# Patient Record
Sex: Female | Born: 1980 | Race: Black or African American | Hispanic: No | Marital: Married | Smoking: Never smoker
Health system: Southern US, Community
[De-identification: ages and names within clinical notes are randomized; demographics above are authoritative.]

## PROBLEM LIST (undated history)

## (undated) ENCOUNTER — Inpatient Hospital Stay (HOSPITAL_COMMUNITY): Payer: Self-pay

## (undated) ENCOUNTER — Emergency Department (HOSPITAL_BASED_OUTPATIENT_CLINIC_OR_DEPARTMENT_OTHER): Payer: 59

## (undated) DIAGNOSIS — F419 Anxiety disorder, unspecified: Secondary | ICD-10-CM

## (undated) DIAGNOSIS — R5383 Other fatigue: Secondary | ICD-10-CM

## (undated) DIAGNOSIS — IMO0002 Reserved for concepts with insufficient information to code with codable children: Secondary | ICD-10-CM

## (undated) DIAGNOSIS — Z8619 Personal history of other infectious and parasitic diseases: Secondary | ICD-10-CM

## (undated) DIAGNOSIS — O24419 Gestational diabetes mellitus in pregnancy, unspecified control: Secondary | ICD-10-CM

## (undated) DIAGNOSIS — G510 Bell's palsy: Secondary | ICD-10-CM

## (undated) DIAGNOSIS — R87619 Unspecified abnormal cytological findings in specimens from cervix uteri: Secondary | ICD-10-CM

## (undated) DIAGNOSIS — G709 Myoneural disorder, unspecified: Secondary | ICD-10-CM

## (undated) DIAGNOSIS — K589 Irritable bowel syndrome without diarrhea: Secondary | ICD-10-CM

## (undated) HISTORY — DX: Irritable bowel syndrome, unspecified: K58.9

## (undated) HISTORY — DX: Myoneural disorder, unspecified: G70.9

## (undated) HISTORY — DX: Anxiety disorder, unspecified: F41.9

## (undated) HISTORY — DX: Bell's palsy: G51.0

## (undated) HISTORY — PX: THERAPEUTIC ABORTION: SHX798

## (undated) HISTORY — PX: CHOLECYSTECTOMY: SHX55

## (undated) HISTORY — DX: Other fatigue: R53.83

## (undated) HISTORY — PX: WISDOM TOOTH EXTRACTION: SHX21

## (undated) HISTORY — DX: Gestational diabetes mellitus in pregnancy, unspecified control: O24.419

## (undated) HISTORY — PX: OTHER SURGICAL HISTORY: SHX169

## (undated) HISTORY — DX: Personal history of other infectious and parasitic diseases: Z86.19

---

## 2009-08-03 ENCOUNTER — Emergency Department (HOSPITAL_COMMUNITY): Admission: EM | Admit: 2009-08-03 | Discharge: 2009-08-03 | Payer: Self-pay | Admitting: Emergency Medicine

## 2009-10-11 ENCOUNTER — Inpatient Hospital Stay (HOSPITAL_COMMUNITY): Admission: AD | Admit: 2009-10-11 | Discharge: 2009-10-11 | Payer: Self-pay | Admitting: Family Medicine

## 2009-12-07 ENCOUNTER — Ambulatory Visit (HOSPITAL_COMMUNITY): Admission: RE | Admit: 2009-12-07 | Discharge: 2009-12-07 | Payer: Self-pay | Admitting: Obstetrics

## 2010-02-01 ENCOUNTER — Inpatient Hospital Stay (HOSPITAL_COMMUNITY): Admission: AD | Admit: 2010-02-01 | Discharge: 2010-02-01 | Payer: Self-pay | Admitting: Obstetrics

## 2010-04-28 ENCOUNTER — Inpatient Hospital Stay (HOSPITAL_COMMUNITY): Admission: AD | Admit: 2010-04-28 | Discharge: 2010-04-28 | Payer: Self-pay | Admitting: Obstetrics

## 2010-05-06 ENCOUNTER — Inpatient Hospital Stay (HOSPITAL_COMMUNITY): Admission: AD | Admit: 2010-05-06 | Discharge: 2010-05-06 | Payer: Self-pay | Admitting: Obstetrics & Gynecology

## 2010-11-28 ENCOUNTER — Inpatient Hospital Stay (HOSPITAL_COMMUNITY): Admission: RE | Admit: 2010-11-28 | Discharge: 2010-05-10 | Payer: Self-pay | Admitting: Obstetrics

## 2011-03-10 LAB — CBC
HCT: 33.8 % — ABNORMAL LOW (ref 36.0–46.0)
MCHC: 33.1 g/dL (ref 30.0–36.0)
MCV: 86.6 fL (ref 78.0–100.0)
Platelets: 167 10*3/uL (ref 150–400)
Platelets: 192 10*3/uL (ref 150–400)
RBC: 4.41 MIL/uL (ref 3.87–5.11)
WBC: 12 10*3/uL — ABNORMAL HIGH (ref 4.0–10.5)
WBC: 13.1 10*3/uL — ABNORMAL HIGH (ref 4.0–10.5)

## 2011-03-10 LAB — ABO/RH: ABO/RH(D): O POS

## 2011-03-10 LAB — HEPATITIS B SURFACE ANTIGEN: Hepatitis B Surface Ag: NEGATIVE

## 2011-03-10 LAB — TYPE AND SCREEN: ABO/RH(D): O POS

## 2011-03-10 LAB — RPR: RPR Ser Ql: NONREACTIVE

## 2011-03-12 LAB — URINALYSIS, ROUTINE W REFLEX MICROSCOPIC
Bilirubin Urine: NEGATIVE
Glucose, UA: NEGATIVE mg/dL
Ketones, ur: NEGATIVE mg/dL
Nitrite: NEGATIVE
Protein, ur: NEGATIVE mg/dL
pH: 6.5 (ref 5.0–8.0)

## 2011-03-27 LAB — URINALYSIS, ROUTINE W REFLEX MICROSCOPIC
Bilirubin Urine: NEGATIVE
Glucose, UA: NEGATIVE mg/dL
Hgb urine dipstick: NEGATIVE
Specific Gravity, Urine: 1.01 (ref 1.005–1.030)

## 2011-03-27 LAB — COMPREHENSIVE METABOLIC PANEL
ALT: 25 U/L (ref 0–35)
Alkaline Phosphatase: 66 U/L (ref 39–117)
BUN: 5 mg/dL — ABNORMAL LOW (ref 6–23)
CO2: 27 mEq/L (ref 19–32)
Chloride: 103 mEq/L (ref 96–112)
GFR calc non Af Amer: >60 mL/min (ref >60)
Glucose, Bld: 80 mg/dL (ref 70–99)
Potassium: 3.8 mEq/L (ref 3.5–5.1)
Sodium: 134 mEq/L — ABNORMAL LOW (ref 135–145)
Total Bilirubin: 0.4 mg/dL (ref 0.3–1.2)

## 2011-03-30 LAB — POCT I-STAT, CHEM 8
BUN: 9 mg/dL (ref 6–23)
Calcium, Ion: 1.08 mmol/L — ABNORMAL LOW (ref 1.12–1.32)
Creatinine, Ser: 0.8 mg/dL (ref 0.4–1.2)
Glucose, Bld: 91 mg/dL (ref 70–99)
Sodium: 137 mEq/L (ref 135–145)
TCO2: 24 mmol/L (ref 0–100)

## 2011-03-30 LAB — GLUCOSE, CAPILLARY: Glucose-Capillary: 89 mg/dL (ref 70–99)

## 2011-06-22 ENCOUNTER — Emergency Department (HOSPITAL_COMMUNITY)
Admission: EM | Admit: 2011-06-22 | Discharge: 2011-06-23 | Disposition: A | Discharge status: Home / Self Care | Payer: No Typology Code available for payment source | Attending: Emergency Medicine | Admitting: Emergency Medicine

## 2011-06-22 DIAGNOSIS — K5900 Constipation, unspecified: Secondary | ICD-10-CM | POA: Insufficient documentation

## 2011-06-22 DIAGNOSIS — M546 Pain in thoracic spine: Secondary | ICD-10-CM | POA: Insufficient documentation

## 2011-06-22 DIAGNOSIS — R1013 Epigastric pain: Secondary | ICD-10-CM | POA: Insufficient documentation

## 2011-06-22 DIAGNOSIS — R569 Unspecified convulsions: Secondary | ICD-10-CM | POA: Insufficient documentation

## 2011-06-22 LAB — URINALYSIS, ROUTINE W REFLEX MICROSCOPIC
Bilirubin Urine: NEGATIVE
Glucose, UA: NEGATIVE mg/dL
Nitrite: NEGATIVE
Specific Gravity, Urine: 1.025 (ref 1.005–1.030)
pH: 8 (ref 5.0–8.0)

## 2011-06-22 LAB — POCT PREGNANCY, URINE: Preg Test, Ur: NEGATIVE

## 2011-06-22 LAB — CBC
HCT: 35.8 % — ABNORMAL LOW (ref 36.0–46.0)
Hemoglobin: 11.7 g/dL — ABNORMAL LOW (ref 12.0–15.0)
MCHC: 32.7 g/dL (ref 30.0–36.0)
WBC: 9.7 10*3/uL (ref 4.0–10.5)

## 2011-06-22 LAB — DIFFERENTIAL
Basophils Absolute: 0 10*3/uL (ref 0.0–0.1)
Lymphocytes Relative: 24 % (ref 12–46)
Lymphs Abs: 2.3 10*3/uL (ref 0.7–4.0)
Monocytes Absolute: 0.7 10*3/uL (ref 0.1–1.0)
Neutro Abs: 6.5 10*3/uL (ref 1.7–7.7)

## 2011-06-23 LAB — LIPASE, BLOOD: Lipase: 18 U/L (ref 11–59)

## 2011-06-23 LAB — COMPREHENSIVE METABOLIC PANEL
Alkaline Phosphatase: 98 U/L (ref 39–117)
BUN: 14 mg/dL (ref 6–23)
Chloride: 102 mEq/L (ref 96–112)
GFR calc Af Amer: >60 mL/min (ref >60)
Glucose, Bld: 112 mg/dL — ABNORMAL HIGH (ref 70–99)
Potassium: 4 mEq/L (ref 3.5–5.1)
Total Bilirubin: 0.3 mg/dL (ref 0.3–1.2)

## 2011-07-18 ENCOUNTER — Other Ambulatory Visit: Payer: Self-pay | Admitting: Emergency Medicine

## 2011-07-18 DIAGNOSIS — R7989 Other specified abnormal findings of blood chemistry: Secondary | ICD-10-CM

## 2011-07-21 ENCOUNTER — Ambulatory Visit
Admission: RE | Admit: 2011-07-21 | Discharge: 2011-07-21 | Disposition: A | Discharge status: Home / Self Care | Payer: No Typology Code available for payment source | Source: Ambulatory Visit | Attending: Emergency Medicine | Admitting: Emergency Medicine

## 2011-07-21 DIAGNOSIS — R7989 Other specified abnormal findings of blood chemistry: Secondary | ICD-10-CM

## 2011-07-28 ENCOUNTER — Encounter (INDEPENDENT_AMBULATORY_CARE_PROVIDER_SITE_OTHER): Payer: Self-pay | Admitting: General Surgery

## 2011-07-28 ENCOUNTER — Ambulatory Visit (INDEPENDENT_AMBULATORY_CARE_PROVIDER_SITE_OTHER): Payer: No Typology Code available for payment source | Admitting: General Surgery

## 2011-07-28 VITALS — BP 130/78 | HR 66 | Temp 97.2°F | Ht 64.0 in | Wt 175.5 lb

## 2011-07-28 DIAGNOSIS — K8020 Calculus of gallbladder without cholecystitis without obstruction: Secondary | ICD-10-CM | POA: Insufficient documentation

## 2011-07-28 NOTE — Progress Notes (Signed)
Ashley Casey is a 30 y.o. female.    Chief Complaint  Patient presents with  . Other    new pt-eval of gallstones    HPI HPI This is a 30 year old female who has a history of upper abdominal pain mostly on the right side and beginning to her occurring in early July. She was seen in [facility] Emergency room at that point in time and was sent home with some pain medication. The pain continued mostly exacerbated by eating and  she was seen at urgent care. She is diagnosed with H. pylori via a blood test and was treated following this. This has not really helped her symptoms to this point. She still continues to have abdominal  pain every few days it has become more tolerable it she's been watching her diet a lot better at this point. It still does occur fairly frequently . She has no nausea vomiting or any fevers associated with this. She did have elevated transaminases on her evaluation at the emergency room previously as well. She also reports a history of some sort of seizure activity which she is evaluated I need to obtain the records with.  COMPLETE ABDOMINAL ULTRASOUND  Comparison: None.  Findings:  Gallbladder: A normal gallbladder is not visualized. Within the  gallbladder fossa region, there is strong shadowing related to  contracted gallbladder. Findings are consistent with wall - echo -  shadow complex. Negative sonographic Murphy's sign.  Common bile duct: Common bile duct is normal in appearance, 3.4  mm.  Liver: The liver has a normal appearance. Normal echogenicity.  No focal lesion.  IVC: Appears normal.  Pancreas: There is limited evaluation of pancreatic tail because  of overlying bowel gas. Visualized portion has a normal  appearance.  Spleen: The spleen has a normal appearance, 4.0 cm in length.  Right Kidney: The right kidney has a normal appearance, measuring  10.7 cm.  Left Kidney: The left kidney is 10.5 cm in length and has a normal  appearance.  Abdominal  aorta: No aneurysm identified.  IMPRESSION:  1. Wall - echo - shadow complex in the gallbladder fossa region,  consistent with contracted gallbladder containing multiple stones.  No sonographic Murphy's sign.  2. No liver abnormality or choledocholithiasis identified.  3. No hydronephrosis.   Past Medical History  Diagnosis Date  . Fatigue   . Chronic kidney disease   . Seizures   . IBS (irritable bowel syndrome)   . Cholelithiasis     Past Surgical History  Procedure Date  . Cancerous cells removed from cervix     Family History  Problem Relation Age of Onset  . Other Mother     sickle cell anemia  . Arthritis Brother   . Asthma Brother     Social History History  Substance Use Topics  . Smoking status: Never Smoker   . Smokeless tobacco: Not on file  . Alcohol Use: No    No Known Allergies  Current Outpatient Prescriptions  Medication Sig Dispense Refill  . carisoprodol (SOMA) 350 MG tablet Take 350 mg by mouth 3 (three) times daily as needed.        . clonazePAM (KLONOPIN) 0.5 MG tablet Take 0.5 mg by mouth 2 (two) times daily as needed.        Marland Kitchen HYDROcodone-acetaminophen (NORCO) 5-325 MG per tablet Take 1 tablet by mouth every 6 (six) hours as needed.        . Melatonin 3 MG TABS Take 3 mg  by mouth as needed.        Marland Kitchen omeprazole (PRILOSEC) 20 MG capsule Take 20 mg by mouth daily.          Review of Systems Review of Systems  Constitutional: Positive for malaise/fatigue.  Gastrointestinal: Positive for abdominal pain.  Neurological: Positive for seizures.  All other systems reviewed and are negative.    Physical Exam Physical Exam  Constitutional: She appears well-developed and well-nourished.  Eyes: No scleral icterus.  Neck: Neck supple.  Cardiovascular: Normal rate, regular rhythm and normal heart sounds.   Respiratory: Effort normal and breath sounds normal. She has no wheezes. She has no rales.  GI: Soft. There is tenderness (ruq tenderness on  exam no murphys sign).  Lymphadenopathy:    She has no cervical adenopathy.     Blood pressure 130/78, pulse 66, temperature 97.2 F (36.2 C), height 5\' 4"  (1.626 m), weight 175 lb 8 oz (79.606 kg).  Assessment/Plan Symptomatic cholelithiasis History of questionable seizure activity  She certainly has symptomatic cholelithiasis. I discussed with her a laparoscopic cholecystectomy with a cholangiogram due to her elevated liver function tests although this may very well be from a fatty liver. I discussed the risks including bleeding, infection, common bile duct injury, an open procedure. She understands all these as well as her postoperative restrictions in terms of work.  Also discussed with her trying to figure out a little bit more but is questionable seizure activity she has. I am going to obtain her records and then we'll proceed from there. It may be that she needs to see her neurologist again prior to beginning surgery.  Ashley Casey 07/28/2011, 4:40 PM

## 2011-07-29 ENCOUNTER — Encounter (INDEPENDENT_AMBULATORY_CARE_PROVIDER_SITE_OTHER): Payer: Self-pay

## 2011-08-13 ENCOUNTER — Encounter (HOSPITAL_COMMUNITY)
Admission: RE | Admit: 2011-08-13 | Discharge: 2011-08-13 | Disposition: A | Discharge status: Home / Self Care | Payer: No Typology Code available for payment source | Source: Ambulatory Visit | Attending: General Surgery | Admitting: General Surgery

## 2011-08-13 LAB — COMPREHENSIVE METABOLIC PANEL
AST: 17 U/L (ref 0–37)
Albumin: 3.7 g/dL (ref 3.5–5.2)
Alkaline Phosphatase: 96 U/L (ref 39–117)
BUN: 11 mg/dL (ref 6–23)
CO2: 26 mEq/L (ref 19–32)
Chloride: 102 mEq/L (ref 96–112)
Potassium: 3.9 mEq/L (ref 3.5–5.1)
Total Bilirubin: 0.1 mg/dL — ABNORMAL LOW (ref 0.3–1.2)

## 2011-08-13 LAB — CBC
HCT: 37.6 % (ref 36.0–46.0)
Platelets: 202 10*3/uL (ref 150–400)
RBC: 4.61 MIL/uL (ref 3.87–5.11)
RDW: 12.8 % (ref 11.5–15.5)
WBC: 9.4 10*3/uL (ref 4.0–10.5)

## 2011-08-13 LAB — DIFFERENTIAL
Basophils Absolute: 0 10*3/uL (ref 0.0–0.1)
Eosinophils Relative: 4 % (ref 0–5)
Lymphocytes Relative: 42 % (ref 12–46)
Neutrophils Relative %: 47 % (ref 43–77)

## 2011-08-20 ENCOUNTER — Other Ambulatory Visit (INDEPENDENT_AMBULATORY_CARE_PROVIDER_SITE_OTHER): Payer: Self-pay | Admitting: General Surgery

## 2011-08-20 ENCOUNTER — Ambulatory Visit (HOSPITAL_COMMUNITY)
Admission: RE | Admit: 2011-08-20 | Discharge: 2011-08-20 | Disposition: A | Discharge status: Home / Self Care | Payer: No Typology Code available for payment source | Source: Ambulatory Visit | Attending: General Surgery | Admitting: General Surgery

## 2011-08-20 ENCOUNTER — Ambulatory Visit (HOSPITAL_COMMUNITY): Payer: No Typology Code available for payment source

## 2011-08-20 DIAGNOSIS — K8010 Calculus of gallbladder with chronic cholecystitis without obstruction: Secondary | ICD-10-CM

## 2011-08-20 DIAGNOSIS — Z01812 Encounter for preprocedural laboratory examination: Secondary | ICD-10-CM | POA: Insufficient documentation

## 2011-08-21 NOTE — Op Note (Signed)
Ashley Casey, Ashley Casey              ACCOUNT NO.:  [id number]  MEDICAL RECORD NO.:  [id number]  LOCATION:  [facility]                         FACILITY:  [facility]  PHYSICIAN:  Juanetta Gosling, MDDATE OF BIRTH:  04/26/1981  DATE OF PROCEDURE:  08/20/2011 DATE OF DISCHARGE:                              OPERATIVE REPORT   PREOPERATIVE DIAGNOSIS:  Symptomatic cholelithiasis.  POSTOPERATIVE DIAGNOSIS:  Symptomatic cholelithiasis.  PROCEDURE:  Laparoscopic cholecystectomy with cholangiogram.  SURGEON:  Juanetta Gosling, MD.  ASSISTANT:  Wilmon Arms. Tsuei, MD.  ANESTHESIA:  General.  SPECIMENS:  Gallbladder and contents to pathology.  ANESTHESIOLOGIST:  Judie Petit, MD.  DISPOSITION OF SPECIMENS:  Pathology.  ESTIMATED BLOOD LOSS:  Minimal.  COMPLICATIONS:  None.  DRAINS:  None.  DISPOSITION:  The patient to recovery room in stable condition.  INDICATIONS:  This is a 30 year old female with a history of upper abdominal pain on the right side since early July.  She has the symptoms due to appear to be from her gallbladder.  She has an ultrasound that shows a strong shadowing related with contracted gallbladder present. Her liver function tests were mildly abnormal upon presentation, but they are normal preop.  She and I discussed laparoscopic cholecystectomy with cholangiogram.  DESCRIPTION OF PROCEDURE:  After informed consent was obtained, the patient was taken to the operating room.  She had sequential compression devices placed on lower extremities.  She has 1 gram of intravenous cefoxitin administered.  She was then placed under general anesthesia without complication.  Her abdomen was prepped and draped in a standard sterile surgical fashion.  Surgical time-out was then performed.  I then infiltrated 0.25% Marcaine below her umbilicus.  I then made an umbilical incision.  I carried this down to the fascia.  This was entered sharply, then into the peritoneum  bluntly.  A 0 Vicryl pursestring suture was placed through this.  I then introduced a Hasson trocar and insufflated the abdomen to 15 mmHg pressure.  I then inserted three additional 5-mm trocars in the epigastrium and the right upper quadrant under direct vision without complication and after infiltration of local anesthetic.  The gallbladder was then retracted cephalad and lateral.  The critical view of safety was clearly obtained.  I then clipped the artery and divided it.  I then clipped the duct distally.  I made a ductotomy and introduced a Cook catheter.  There was a stone impacted in the duct which I milked out and removed.  I then placed a Cook catheter and secured this.  I then performed a cholangiogram which showed filling of the liver, filling of my duodenum without any abnormalities and I was in the cystic duct.  I then removed the catheter.  I clipped the cystic duct and divided this.  I then removed the gallbladder from the liver bed without difficulty.  This was placed in EndoCatch bag and removed from the umbilicus.  Hemostasis was obtained.  I then irrigated, this was clear.  I then removed my Hasson trocar.  I closed my umbilical defect with and obliterated it.  There was no evidence of an entry injury.  I then desufflated the abdomen and removed all trocars.  The incisions were then closed with 4-0 Monocryl and Dermabond.  She tolerated this well, extubated in the operating room, and transferred to the recovery room in stable condition.     Juanetta Gosling, MD     MCW/MEDQ  D:  08/20/2011  T:  08/20/2011  Job:  [id number]  cc:   Brett Canales A. Cleta Alberts, M.D.  Electronically Signed by Emelia Loron MD on 08/21/2011 02:43:39 PM

## 2011-08-22 ENCOUNTER — Encounter (INDEPENDENT_AMBULATORY_CARE_PROVIDER_SITE_OTHER): Payer: Self-pay

## 2011-08-22 ENCOUNTER — Telehealth (INDEPENDENT_AMBULATORY_CARE_PROVIDER_SITE_OTHER): Payer: Self-pay

## 2011-08-22 NOTE — Telephone Encounter (Signed)
Notified pt that i faxed her RTW note to her job for her to return to work on 09-16-11.Ashley Casey

## 2011-08-29 ENCOUNTER — Telehealth (INDEPENDENT_AMBULATORY_CARE_PROVIDER_SITE_OTHER): Payer: Self-pay

## 2011-08-29 ENCOUNTER — Encounter (INDEPENDENT_AMBULATORY_CARE_PROVIDER_SITE_OTHER): Payer: Self-pay

## 2011-08-29 NOTE — Telephone Encounter (Signed)
At patient's request RTW on 09/02/2011- Verbal ok per Dr. Dwain Sarna- not given to patient with weight limitations of 15lbs. Note created in EPIC. RMP

## 2011-09-15 ENCOUNTER — Encounter (INDEPENDENT_AMBULATORY_CARE_PROVIDER_SITE_OTHER): Payer: Self-pay | Admitting: General Surgery

## 2011-09-29 ENCOUNTER — Ambulatory Visit (INDEPENDENT_AMBULATORY_CARE_PROVIDER_SITE_OTHER): Payer: 59 | Admitting: General Surgery

## 2011-09-29 ENCOUNTER — Encounter (INDEPENDENT_AMBULATORY_CARE_PROVIDER_SITE_OTHER): Payer: Self-pay | Admitting: General Surgery

## 2011-09-29 VITALS — BP 106/78 | HR 60 | Temp 96.8°F | Resp 20 | Ht 60.0 in | Wt 174.2 lb

## 2011-09-29 DIAGNOSIS — Z09 Encounter for follow-up examination after completed treatment for conditions other than malignant neoplasm: Secondary | ICD-10-CM

## 2011-09-29 NOTE — Progress Notes (Signed)
Subjective:     Patient ID: Kenyon Ana, female   DOB: Jul 03, 1981, 30 y.o.   MRN: [id number]  HPI This is a 30 year old female with a history of symptomatic cholelithiasis that I did a laparoscopic cholecystectomy and cholangiogram. She has done well from that now. She occasionally has some loose stools but is otherwise back to working and she has no real significant complaints right now. Her pathology shows chronic cholecystitis and cholelithiasis.  Review of Systems     Objective:   Physical Exam Well healed incisions without infection    Assessment:     S/p lap chole    Plan:     She is doing well after her gallbladder removal. We discussed the loose stools as well as the dietary changes and  should likely get better over some time. I released to full activities she is going to come back and see me as needed.

## 2012-04-18 ENCOUNTER — Emergency Department (HOSPITAL_COMMUNITY)
Admission: EM | Admit: 2012-04-18 | Discharge: 2012-04-18 | Disposition: A | Discharge status: Home / Self Care | Payer: PRIVATE HEALTH INSURANCE | Attending: Emergency Medicine | Admitting: Emergency Medicine

## 2012-04-18 ENCOUNTER — Encounter (HOSPITAL_COMMUNITY): Payer: Self-pay

## 2012-04-18 ENCOUNTER — Emergency Department (HOSPITAL_COMMUNITY): Admission: EM | Admit: 2012-04-18 | Payer: Self-pay

## 2012-04-18 DIAGNOSIS — K589 Irritable bowel syndrome without diarrhea: Secondary | ICD-10-CM | POA: Insufficient documentation

## 2012-04-18 DIAGNOSIS — R259 Unspecified abnormal involuntary movements: Secondary | ICD-10-CM | POA: Insufficient documentation

## 2012-04-18 DIAGNOSIS — N189 Chronic kidney disease, unspecified: Secondary | ICD-10-CM | POA: Insufficient documentation

## 2012-04-18 DIAGNOSIS — R252 Cramp and spasm: Secondary | ICD-10-CM

## 2012-04-18 NOTE — ED Notes (Signed)
Pt presents with no acute distress.  Pt has HX of pseudo seizures- Pt presents with twitching to LUE since midnight

## 2012-04-18 NOTE — ED Provider Notes (Signed)
History     CSN: [id number]  Arrival date & time 04/18/12  0223   First MD Initiated Contact with Patient 04/18/12 0325      Chief Complaint  Patient presents with  . Spasms    HPI  History provided by the patient. 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 Patient reports that she has had twitching and spasming of her left upper extremity off-and-on for the past 5-6 years. Patient states that she was told in the past that she may have pseudoseizures. Patient states the episodes of spasming comes on acutely without warning and may last several minutes to hours. Patient states that she has never had a thorough evaluation due to changes in medical insurance. Patient states that she was recently planning to have an EEG by neurologist but then lost her Medicaid. She currently has [place] health care and is being followed for her general medical care by Dr. Madilyn Hook. Patient states she is normally treated for her symptoms soma and Valium which usually relieve her symptoms. Patient reports having episodes several times a month. Patient denies any loss of consciousness with episode. She denies any numbness or weakness in her arm. She does report having some soreness with prolonged episode.     Past Medical History  Diagnosis Date  . Fatigue   . Chronic kidney disease   . Seizures   . IBS (irritable bowel syndrome)   . Cholelithiasis     Past Surgical History  Procedure Date  . 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 Cholecystectomy 8/29/121    Family History  Problem Relation Age of Onset  . Other Mother     sickle cell anemia  . Arthritis Brother   . Asthma Brother     History  Substance Use Topics  . Smoking status: Never Smoker   . Smokeless tobacco: Never Used  . Alcohol Use: No    OB History    Grav Para Term Preterm Abortions TAB SAB Ect Mult Living              Review of Systems  Constitutional: Negative for fever.  HENT: Negative for neck pain.   Musculoskeletal: Negative for back pain.  Neurological: Negative for syncope, weakness, light-headedness, numbness and headaches.    Allergies  Review of patient's allergies indicates no known allergies.  Home Medications   Current Outpatient Rx  Name Route Sig Dispense Refill  . CARBAMAZEPINE 200 MG PO TABS Oral Take 200 mg by mouth 2 (two) times daily.    Marland Kitchen CARISOPRODOL 350 MG PO TABS Oral Take 350 mg by mouth 3 (three) times daily as needed.      Marland Kitchen DIAZEPAM 5 MG PO TABS Oral Take 5 mg by mouth 3 (three) times daily as needed.    Marland Kitchen MELATONIN 3 MG PO TABS Oral Take 3 mg by mouth as needed.      Marland Kitchen PRE-NATAL PO Oral Take by mouth daily.        BP 102/72  Pulse 92  Temp(Src) 98.2 F (36.8 C) (Oral)  Resp 16  Ht 5\' 4"  (1.626 m)  Wt 176 lb (79.833 kg)  BMI 30.21 kg/m2  SpO2 100%  Physical Exam  Nursing note and vitals reviewed. Constitutional: She is oriented to person, place, and time. She appears well-developed and well-nourished. No distress.  HENT:  Head: Normocephalic and atraumatic.  Eyes: Conjunctivae and EOM are normal.  Pupils are equal, round, and reactive to light.  Neck: Normal range of motion. Neck supple.  Cardiovascular: Normal rate and regular rhythm.   Pulmonary/Chest: Effort normal and breath sounds normal. No respiratory distress. She has no wheezes. She has no rales.  Abdominal: Soft.  Neurological: She is alert and oriented to person, place, and time. She has normal strength. No cranial nerve deficit or sensory deficit. Gait normal.       Normal sensations in all extremities. Patient has repetitive reaching and abduction of left upper extremity. This is diminished with distraction and strength testing. Normal strength in all extremities.  Skin: Skin is warm and dry. No rash noted.  Psychiatric: She has a normal mood and affect. Her behavior is normal.     ED Course  Procedures      1. Spasms of the hands or feet       MDM  4:00 AM patient seen and evaluated. Patient no acute distress. Patient awake and alert. Patient with intermittent spasming of left upper extremity. This diminishes with distraction and activities.        Angus Seller, [place] 04/18/12 2056

## 2012-04-18 NOTE — Discharge Instructions (Signed)
You were seen and evaluated today for your symptoms of spasming of your left upper extremity. At this time your providers do not feel your symptoms are caused from any emergent condition. It is recommended that you continue to followup with your primary care provider and neurology specialist for further evaluation and treatment of your conditions.   RESOURCE GUIDE  Dental Problems  Patients with Medicaid: [facility] [phone] [address]                                           [phone] [address] Phone:  [phone]                                                  Phone:  [phone]  If unable to pay or uninsured, contact:  [facility] or [facility]. to become qualified for the adult dental clinic.  Chronic Pain Problems Contact [facility]  [phone] Patients need to be referred by their primary care doctor.  Insufficient Money for Medicine Contact [employer]:  call "211" or [facility] 240-747-4246.  No Primary Care Doctor Call [facility]  [phone] Other agencies that provide inexpensive medical care    [facility]  [phone]    [facility]  [phone]    [facility]  [phone]    [facility]  [phone]    [facility]  [phone]    [facility]  [phone]  Psychological Services [facility]  [phone] [facility]  [phone] [facility]   [phone] (emergency services (509)[phone])  Substance Abuse Resources [facility]  828-706-3889 [facility] 985-773-4346 [facility] [place] 478-066-1124 Floydene Flock (209)204-3392 Residential & Outpatient Substance Abuse Program  360-710-0398  Abuse/Neglect [facility] Child Abuse Hotline (250)888-2391 [facility] Child Abuse Hotline (330)776-1080 (After Hours)  Emergency Shelter [facility] 4094476490  Maternity Homes [facility] at the [place] [facility] [place] [phone]) [phone] [facility] 951-030-2327  MRSA Hotline #:   (989)386-5136    [facility] Resources  [facility]     [employer]                          [facility] Dept. [address] [place]                       [address]      [address]                                                  Cristobal Goldmann Phone:  [phone]  Phone:  [phone]                 Phone:  [phone]  [place] [facility] Phone:  [phone]  [place] Child Abuse Hotline (336) 342-1394 (336) [phone] (After Hours)   

## 2012-04-18 NOTE — ED Notes (Signed)
[id number]<AV> Expected date:<BR> Expected time:<BR> Means of arrival:<BR> Comments:<BR> Terminal Clean: [id number]

## 2012-04-19 NOTE — ED Provider Notes (Signed)
Medical screening examination/treatment/procedure(s) were performed by non-physician practitioner and as supervising physician I was immediately available for consultation/collaboration.  Sunnie Nielsen, MD 04/19/12 [phone]

## 2012-05-14 ENCOUNTER — Telehealth: Payer: Self-pay

## 2012-05-14 NOTE — Telephone Encounter (Signed)
Pt came in to request her medical records, she just today made a appt with a neurologist for tomorrow she would like to pick them up by tonight if possible

## 2012-05-14 NOTE — Telephone Encounter (Signed)
Made copies of patient's chart and put in the pick-up drawer.

## 2012-05-19 ENCOUNTER — Encounter: Payer: Self-pay | Admitting: *Deleted

## 2012-05-19 ENCOUNTER — Encounter: Payer: Self-pay | Admitting: Family Medicine

## 2012-05-19 ENCOUNTER — Ambulatory Visit (INDEPENDENT_AMBULATORY_CARE_PROVIDER_SITE_OTHER): Payer: No Typology Code available for payment source | Admitting: Family Medicine

## 2012-05-19 DIAGNOSIS — F411 Generalized anxiety disorder: Secondary | ICD-10-CM

## 2012-05-19 DIAGNOSIS — R579 Shock, unspecified: Secondary | ICD-10-CM

## 2012-05-19 DIAGNOSIS — F419 Anxiety disorder, unspecified: Secondary | ICD-10-CM

## 2012-05-19 NOTE — Progress Notes (Signed)
Subjective: Patient has history of a seizure disorder for some years. Apparently she is being worked up at present by course a neurology. 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 She she is in the process of this workup. She saw her primary doctor on Friday and on Saturday soft neurologist. They set her up for an MRI which did yesterday. She woke up at about 3 AM with gasping for breath. She had shortness of breath. She had chest tightness in the upper chest. This continued today. She took muscle relaxant. She took some ibuprofen. Symptoms went back to sleep when she woke up she is hurting again so she came back in here. She's been under a lot of stress. She's been very anxious.  Objective: Alert oriented anxious-appearing Afro-American female. Her husband is with her and very supportive. Her throat was clear. Neck supple without nodes thyromegaly. Chest clear. Heart regular without murmurs. Soft without mass or tenderness. Cholecystectomy scars are visible.  EKG was normal.  Assessment: Atypical chest pain Anxiety --- it would appear that she had a panic attack even during her sleep last night when she woke up gasping for air. She really looks stressed and anxious. She denies suicidal ideation. Pseudoseizures?  Plan: She already has antianxiety medication. Advised to resume the omeprazole but she has. She did take ibuprofen up to 3 times a day if she needs to for pain. Advised enough rest. Stay off work tonight. She should ask her neurologist as to whether he would like her to take an SSRI for her anxiety and depression. I prefer not to start her on new medications tonight since she is a workup in progress for her seizures.

## 2012-05-19 NOTE — Patient Instructions (Signed)
Take your omeprazole 1 daily. Try and get enough rest tonight If abruptly worse go to the emergency room

## 2012-09-04 ENCOUNTER — Ambulatory Visit (INDEPENDENT_AMBULATORY_CARE_PROVIDER_SITE_OTHER): Payer: No Typology Code available for payment source | Admitting: Family Medicine

## 2012-09-04 ENCOUNTER — Ambulatory Visit: Payer: No Typology Code available for payment source

## 2012-09-04 VITALS — BP 98/65 | HR 84 | Temp 97.6°F | Resp 16 | Ht 64.0 in | Wt 174.0 lb

## 2012-09-04 DIAGNOSIS — M546 Pain in thoracic spine: Secondary | ICD-10-CM

## 2012-09-04 DIAGNOSIS — M25511 Pain in right shoulder: Secondary | ICD-10-CM

## 2012-09-04 DIAGNOSIS — S83259A Bucket-handle tear of lateral meniscus, current injury, unspecified knee, initial encounter: Secondary | ICD-10-CM

## 2012-09-04 DIAGNOSIS — M542 Cervicalgia: Secondary | ICD-10-CM

## 2012-09-04 DIAGNOSIS — S0093XA Contusion of unspecified part of head, initial encounter: Secondary | ICD-10-CM

## 2012-09-04 MED ORDER — IBUPROFEN 800 MG PO TABS: 800.0000 mg | ORAL_TABLET | Freq: Three times a day (TID) | ORAL | Status: AC | PRN

## 2012-09-04 MED ORDER — CYCLOBENZAPRINE HCL 10 MG PO TABS: 10.0000 mg | ORAL_TABLET | Freq: Three times a day (TID) | ORAL | Status: AC | PRN
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
## 2012-09-07 NOTE — Progress Notes (Signed)
Reviewed and agree.

## 2012-09-15 ENCOUNTER — Other Ambulatory Visit: Payer: Self-pay | Admitting: Obstetrics

## 2013-03-19 ENCOUNTER — Ambulatory Visit (INDEPENDENT_AMBULATORY_CARE_PROVIDER_SITE_OTHER): Payer: No Typology Code available for payment source | Admitting: Physician Assistant

## 2013-03-19 VITALS — BP 101/71 | HR 78 | Temp 97.7°F | Resp 16 | Ht 64.0 in | Wt 189.0 lb

## 2013-03-19 DIAGNOSIS — R350 Frequency of micturition: Secondary | ICD-10-CM

## 2013-03-19 DIAGNOSIS — N390 Urinary tract infection, site not specified: Secondary | ICD-10-CM

## 2013-03-19 DIAGNOSIS — R3915 Urgency of urination: Secondary | ICD-10-CM

## 2013-03-19 LAB — POCT URINALYSIS DIPSTICK
Bilirubin, UA: NEGATIVE
Glucose, UA: NEGATIVE
Nitrite, UA: NEGATIVE
Protein, UA: NEGATIVE
Spec Grav, UA: 1.02
Urobilinogen, UA: 0.2
pH, UA: 5.5

## 2013-03-19 LAB — POCT UA - MICROSCOPIC ONLY
Casts, Ur, LPF, POC: NEGATIVE
Crystals, Ur, HPF, POC: NEGATIVE
Mucus, UA: POSITIVE
Yeast, UA: NEGATIVE

## 2013-03-19 MED ORDER — CIPROFLOXACIN HCL 500 MG PO TABS: 500.0000 mg | ORAL_TABLET | Freq: Two times a day (BID) | ORAL | Status: DC

## 2013-03-19 NOTE — Progress Notes (Signed)
Patient ID: Ashley Casey MRN: [id number], DOB: [birth year], 32 y.o. Date of Encounter: 03/19/2013, 4:56 PM  Primary Physician: Karie Chimera, MD  Chief Complaint: urinary frequency and dysuria  HPI: 32 y.o. year old female with presents with 1 day history of urinary frequency, dysuria, and suprapubic pressure. Denies flank pain, nausea, vomiting, fever, chills, vaginal discharge, or odor. Has not taken and OTC medications for this, but has picked up AZO for use later today. Patient not having regular periods due to Depo-provera.  Patient is otherwise doing well without issues or complaints.  Past Medical History  Diagnosis Date  . Fatigue   . Chronic kidney disease   . IBS (irritable bowel syndrome)   . Cholelithiasis      Home Meds: Prior to Admission medications   Medication Sig Start Date End Date Taking? Authorizing Provider  Ascorbic Acid (VITAMIN C) 1000 MG tablet Take 1,000 mg by mouth daily.   Yes Historical Provider, MD  carbamazepine (TEGRETOL) 200 MG tablet Take 200 mg by mouth 2 (two) times daily.   Yes Historical Provider, MD  carisoprodol (SOMA) 350 MG tablet Take 350 mg by mouth 3 (three) times daily as needed.    Yes Historical Provider, MD  clonazePAM (KLONOPIN) 1 MG tablet Take 1 mg by mouth 2 (two) times daily as needed.   Yes Historical Provider, MD  Prenatal Multivit-Min-Fe-FA (PRE-NATAL PO) Take by mouth daily.     Yes Historical Provider, MD  ciprofloxacin (CIPRO) 500 MG tablet Take 1 tablet (500 mg total) by mouth 2 (two) times daily. 03/19/13   Estalee Mccandlish Jaquita Rector, PA-C  diazepam (VALIUM) 5 MG tablet Take 5 mg by mouth 3 (three) times daily as needed.    Historical Provider, MD  medroxyPROGESTERone (DEPO-PROVERA) 150 MG/ML injection Inject 150 mg into the muscle every 3 (three) months.    Historical Provider, MD  Melatonin 3 MG TABS Take 3 mg by mouth as needed.      Historical Provider, MD    Allergies:  Allergies  Allergen Reactions  . Tegretol  (Carbamazepine)     History   Social History  . Marital Status: Single    Spouse Name: N/A    Number of Children: N/A  . Years of Education: N/A   Occupational History  . Not on file.   Social History Main Topics  . Smoking status: Never Smoker   . Smokeless tobacco: Never Used  . Alcohol Use: No  . Drug Use: No  . Sexually Active: Yes   Other Topics Concern  . Not on file   Social History Narrative  . No narrative on file     Review of Systems: Constitutional: negative for chills, fever, night sweats, weight changes, or fatigue  HEENT: negative for vision changes, hearing loss, congestion, rhinorrhea, ST, epistaxis, or sinus pressure Cardiovascular: negative for chest pain or palpitations Respiratory: negative for cough, hemoptysis, wheezing, shortness of breath. Abdominal: positive for suprapubic abdominal pain,   No nausea, vomiting, diarrhea, or constipation Genitourinary: positive for urinary frequency and dysuria. Negative for vaginal discharge, odor, or pelvic pain.  Dermatological: negative for rashes. Neurologic: negative for headache, dizziness, or syncope   Physical Exam: Blood pressure 101/71, pulse 78, temperature 97.7 F (36.5 C), temperature source Oral, resp. rate 16, height 5\' 4"  (1.626 m), weight 189 lb (85.73 kg), SpO2 99.00%., Body mass index is 32.43 kg/(m^2). General: Well developed, well nourished, in no acute distress. Head: Normocephalic, atraumatic, eyes without discharge, sclera non-icteric, nares are without  discharge. External ear normal in appearance. Neck: Supple. No thyromegaly. Full ROM. No lymphadenopathy. Lungs: Clear bilaterally to auscultation without wheezes, rales, or rhonchi. Breathing is unlabored. Heart: RRR with S1 S2. No murmurs, rubs, or gallops appreciated. Abdominal: +BS x 4. No hepatosplenomegaly, rebound tenderness, or guarding. Positive suprapubic tenderness. No CVA tenderness bilaterally.  Msk:  Strength and tone  normal for age. Extremities/Skin: Warm and dry. No clubbing or cyanosis. No edema.  Neuro: Alert and oriented X 3. Moves all extremities spontaneously. Gait is normal. CNII-XII grossly in tact. Psych:  Responds to questions appropriately with a normal affect.   Labs: Results for orders placed in visit on 03/19/13  POCT URINALYSIS DIPSTICK      Result Value Range   Color, UA yellow     Clarity, UA cloudy     Glucose, UA neg     Bilirubin, UA neg     Ketones, UA trace     Spec Grav, UA 1.020     Blood, UA trace     pH, UA 5.5     Protein, UA neg     Urobilinogen, UA 0.2     Nitrite, UA neg     Leukocytes, UA large (3+)    POCT UA - MICROSCOPIC ONLY      Result Value Range   WBC, Ur, HPF, POC tntc     RBC, urine, microscopic tntc     Bacteria, U Microscopic 2+     Mucus, UA pos     Epithelial cells, urine per micros 1-2     Crystals, Ur, HPF, POC neg     Casts, Ur, LPF, POC neg     Yeast, UA neg        ASSESSMENT AND PLAN:  32 y.o. year old female with urinary tract infection Urine culture sent Increase fluids Cipro 500 mg bid x 5 days Azo as needed for symptomatic relief Follow up if symptoms worsen or fail to improve.  Grier Mitts, PA-C 03/19/2013 4:56 PM

## 2013-03-21 LAB — URINE CULTURE: Colony Count: 3000

## 2013-05-22 ENCOUNTER — Other Ambulatory Visit: Payer: Self-pay | Admitting: Physician Assistant

## 2013-07-13 ENCOUNTER — Ambulatory Visit (INDEPENDENT_AMBULATORY_CARE_PROVIDER_SITE_OTHER): Payer: No Typology Code available for payment source | Admitting: Family Medicine

## 2013-07-13 ENCOUNTER — Encounter: Payer: Self-pay | Admitting: Student

## 2013-07-13 VITALS — BP 95/75 | HR 87 | Temp 98.0°F | Resp 17 | Wt 197.0 lb

## 2013-07-13 DIAGNOSIS — R112 Nausea with vomiting, unspecified: Secondary | ICD-10-CM

## 2013-07-13 DIAGNOSIS — E860 Dehydration: Secondary | ICD-10-CM

## 2013-07-13 DIAGNOSIS — R197 Diarrhea, unspecified: Secondary | ICD-10-CM

## 2013-07-13 LAB — POCT UA - MICROSCOPIC ONLY
Casts, Ur, LPF, POC: NEGATIVE
Crystals, Ur, HPF, POC: NEGATIVE
Mucus, UA: NEGATIVE

## 2013-07-13 LAB — POCT CBC
Hemoglobin: 13.1 g/dL (ref 12.2–16.2)
Lymph, poc: 2.8 (ref 0.6–3.4)
MCH, POC: 27.2 pg (ref 27–31.2)
MCHC: 30.5 g/dL — AB (ref 31.8–35.4)
MID (cbc): 0.6 (ref 0–0.9)
MPV: 10.1 fL (ref 0–99.8)
POC MID %: 6.2 %M (ref 0–12)
WBC: 10.2 10*3/uL (ref 4.6–10.2)

## 2013-07-13 LAB — POCT URINALYSIS DIPSTICK
Bilirubin, UA: NEGATIVE
Blood, UA: NEGATIVE
Nitrite, UA: NEGATIVE
pH, UA: 8

## 2013-07-13 MED ORDER — ONDANSETRON 4 MG PO TBDP: 8.0000 mg | ORAL_TABLET | Freq: Once | ORAL | Status: DC

## 2013-07-13 MED ORDER — ONDANSETRON HCL 8 MG PO TABS: 8.0000 mg | ORAL_TABLET | Freq: Three times a day (TID) | ORAL | Status: DC | PRN

## 2013-07-13 NOTE — Patient Instructions (Addendum)
Stick to a bland diet today- let me know if you are not feeling better in the next few days. If you get worse or continue to have vomiting or diarrhea come back in to see [place].  Use the zofran if needed for nausea

## 2013-07-13 NOTE — Progress Notes (Signed)
[facility] [address] [place] [address] [phone] [phone]  Date:  07/13/2013   Name:  Ashley Casey   DOB:  01/11/1981   MRN:  [id number]  PCP:  Karie Chimera, MD    Chief Complaint: Nausea and Diarrhea   History of Present Illness:  Ashley Casey is a 32 y.o. very pleasant female patient who presents with the following:  Here today with illness.  She noted onset of stomach pain, nausea and vomiting this am.  She also has diarrhea just this morning. She has had a few episodes of both vomiting and diarrhea.   This morning she had a breakfast sandwich at home and drank some lemonade prior to onset of sx..    She did eat shrimp egg rolls last night.   She last vomited this morning, last had diarrhea prior to arrival at clinic.   She has not noted a fever.    She has used phenergan in the past.    She was recently diagnosed with severe muscle spasms instead of seizures; her former diagnosis of seizures is in question.  She uses soma and klonopin prn for these muscle spasms  She was treated with cipro for a UTI about one month ago per her PCP.   She no longer has any urinary sx.   No unusual vaginal discharge.   LMP was just about one month ago  States that her mother has sickle cell disease, chronic arthritis, scoliosis, and lupus.    Patient Active Problem List   Diagnosis Date Noted  . Symptomatic cholelithiasis 07/28/2011    Past Medical History  Diagnosis Date  . Fatigue   . Chronic kidney disease   . IBS (irritable bowel syndrome)   . Cholelithiasis   . Anxiety   . Neuromuscular disorder   . Seizures     Past Surgical History  Procedure Laterality Date  . 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 Cholecystectomy  8/29/121  . Therapeutic abortion      History  Substance Use Topics  . Smoking status: Never Smoker   . Smokeless tobacco: Never Used  . Alcohol Use: Yes    Family History  Problem Relation Age of Onset  . Other  Mother     sickle cell anemia  . Arthritis Brother   . Asthma Brother     Allergies  Allergen Reactions  . Tegretol (Carbamazepine)     Medication list has been reviewed and updated.  Current Outpatient Prescriptions on File Prior to Visit  Medication Sig Dispense Refill  . carisoprodol (SOMA) 350 MG tablet Take 350 mg by mouth 3 (three) times daily as needed.       . clonazePAM (KLONOPIN) 1 MG tablet Take 1 mg by mouth 2 (two) times daily as needed.      . Prenatal Multivit-Min-Fe-FA (PRE-NATAL PO) Take by mouth daily.         No current facility-administered medications on file prior to visit.    Review of Systems:  As per HPI- otherwise negative. Feels "hungry"  Physical Examination: Filed Vitals:   07/13/13 1446  BP: 92/72  Pulse: 87  Temp: 98 F (36.7 C)  Resp: 17   Filed Vitals:   07/13/13 1446  Weight: 197 lb (89.359 kg)   This BP is typical for her per past notes Body mass index is 33.8 kg/(m^2). Ideal Body Weight:    GEN: WDWN, NAD, Non-toxic, A & O x 3,  obese HEENT: Atraumatic, Normocephalic. Neck supple. No masses, No LAD.  Bilateral TM wnl, oropharynx normal.  PEERL,EOMI.   Ears and Nose: No external deformity. CV: RRR, No M/G/R. No JVD. No thrill. No extra heart sounds. PULM: CTA B, no wheezes, crackles, rhonchi. No retractions. No resp. distress. No accessory muscle use. ABD: S, NT, ND, +BS. No rebound. No HSM.   EXTR: No c/c/e NEURO Normal gait.  PSYCH: Normally interactive. Conversant. Not depressed or anxious appearing.  Calm demeanor.   Results for orders placed in visit on 07/13/13  POCT URINE PREGNANCY      Result Value Range   Preg Test, Ur Negative    POCT CBC      Result Value Range   WBC 10.2  4.6 - 10.2 K/uL   Lymph, poc 2.8  0.6 - 3.4   POC LYMPH PERCENT 27.8  10 - 50 %L   MID (cbc) 0.6  0 - 0.9   POC MID % 6.2  0 - 12 %M   POC Granulocyte 6.7  2 - 6.9   Granulocyte percent 66.0  37 - 80 %G   RBC 4.81  4.04 - 5.48 M/uL    Hemoglobin 13.1  12.2 - 16.2 g/dL   HCT, POC 16.1  09.6 - 47.9 %   MCV 89.2  80 - 97 fL   MCH, POC 27.2  27 - 31.2 pg   MCHC 30.5 (*) 31.8 - 35.4 g/dL   RDW, POC 04.5     Platelet Count, POC 200  142 - 424 K/uL   MPV 10.1  0 - 99.8 fL  POCT URINALYSIS DIPSTICK      Result Value Range   Color, UA yellow     Clarity, UA clear     Glucose, UA neg     Bilirubin, UA neg     Ketones, UA neg     Spec Grav, UA 1.020     Blood, UA neg     pH, UA 8.0     Protein, UA neg     Urobilinogen, UA 1.0     Nitrite, UA neg     Leukocytes, UA Negative    POCT UA - MICROSCOPIC ONLY      Result Value Range   WBC, Ur, HPF, POC 0-4     RBC, urine, microscopic neg     Bacteria, U Microscopic trace     Mucus, UA neg     Epithelial cells, urine per micros 6-18     Crystals, Ur, HPF, POC neg     Casts, Ur, LPF, POC neg     Yeast, UA neg     Given zofran 8mg  by mouth once.  She felt better and ate a pack of PB crackers  Assessment and Plan: Diarrhea  Nausea and vomiting - Plan: POCT urine pregnancy, POCT CBC, POCT urinalysis dipstick, POCT UA - Microscopic Only, ondansetron (ZOFRAN-ODT) disintegrating tablet 8 mg, ondansetron (ZOFRAN) 8 MG tablet  Likely viral gastroenteritis.  Rx zofran to use as needed- prefer this to phenergan because she is also on soma and klonopin.   Advised her to eat a light diet for the next couple of days and avoid dairy products.  If she does not get her menses she will take a home HCG test.    See patient instructions for more details.     Signed Abbe Amsterdam, MD

## 2013-09-23 ENCOUNTER — Ambulatory Visit (INDEPENDENT_AMBULATORY_CARE_PROVIDER_SITE_OTHER): Payer: No Typology Code available for payment source | Admitting: Internal Medicine

## 2013-09-23 VITALS — BP 114/66 | HR 79 | Temp 98.1°F | Resp 18 | Ht 64.0 in | Wt 198.0 lb

## 2013-09-23 DIAGNOSIS — H18821 Corneal disorder due to contact lens, right eye: Secondary | ICD-10-CM

## 2013-09-23 DIAGNOSIS — H18829 Corneal disorder due to contact lens, unspecified eye: Secondary | ICD-10-CM

## 2013-09-23 MED ORDER — GENTAMICIN SULFATE 0.3 % OP SOLN: 2.0000 [drp] | OPHTHALMIC | Status: DC

## 2013-09-23 NOTE — Progress Notes (Deleted)
  Subjective:    Patient ID: Ashley Casey, female    DOB: [birth year], 32 y.o.   MRN: [id number]  HPI    Review of Systems     Objective:   Physical Exam        Assessment & Plan:

## 2013-09-23 NOTE — Progress Notes (Signed)
  Subjective:  This chart was scribed for Tonye Pearson, MD by Arlan Organ, ED Scribe. This patient was seen in room Room 5  and the patient's care was started 4:15 PM.    Patient ID: Ashley Casey, female    DOB: November 22, 1981, 32 y.o.   MRN: [id number]  HPI HPI Comments: Ashley Casey is a 32 y.o. female who presents to [facility] with right eye redness that started this morning. Pt states it feels very irritated. Pt denies any foreign bodies landing in her eye. Pt states she used eye drops with no relief. Pt states she wears colored eye contacts daily. No other symptoms of illness. No discharge.   Review of Systems     Objective:   Physical Exam  Eyes: Conjunctivae and EOM are normal. Pupils are equal, round, and reactive to light. Right eye exhibits no discharge. Left eye exhibits no discharge.  Right eye injected   Procedure: Ophthaine 2 drops Stained  Uptake at 2 oclock adjacent to iris   no corneal defect    Assessment & Plan:  The encounter diagnosis was Corneal abrasion due to contact lens, right.   Meds ordered this encounter  Medications  . gentamicin (GARAMYCIN) 0.3 % ophthalmic solution    Sig: Place 2 drops into the right eye every 4 (four) hours.    Dispense:  5 mL    Refill:  0   no lens til well

## 2013-10-17 ENCOUNTER — Ambulatory Visit (INDEPENDENT_AMBULATORY_CARE_PROVIDER_SITE_OTHER): Payer: Medicaid Other | Admitting: Obstetrics

## 2013-10-17 ENCOUNTER — Other Ambulatory Visit: Payer: Self-pay | Admitting: Obstetrics

## 2013-10-17 ENCOUNTER — Encounter: Payer: Self-pay | Admitting: Obstetrics

## 2013-10-17 VITALS — BP 115/84 | Temp 98.8°F | Wt 202.0 lb

## 2013-10-17 DIAGNOSIS — O26841 Uterine size-date discrepancy, first trimester: Secondary | ICD-10-CM

## 2013-10-17 DIAGNOSIS — Z3687 Encounter for antenatal screening for uncertain dates: Secondary | ICD-10-CM

## 2013-10-17 DIAGNOSIS — Z3481 Encounter for supervision of other normal pregnancy, first trimester: Secondary | ICD-10-CM

## 2013-10-17 DIAGNOSIS — Z113 Encounter for screening for infections with a predominantly sexual mode of transmission: Secondary | ICD-10-CM

## 2013-10-17 DIAGNOSIS — F1920 Other psychoactive substance dependence, uncomplicated: Secondary | ICD-10-CM | POA: Insufficient documentation

## 2013-10-17 DIAGNOSIS — Z3201 Encounter for pregnancy test, result positive: Secondary | ICD-10-CM

## 2013-10-17 NOTE — Progress Notes (Signed)
Pulse- 77  Subjective:    Ashley Casey is being seen today for her first obstetrical visit.  This is a planned pregnancy. She is at [redacted]w[redacted]d gestation. Her obstetrical history is significant for excessive weight gain. Relationship with FOB: significant other, living together. Patient does intend to breast feed. Pregnancy history fully reviewed.  Menstrual History: OB History   Grav Para Term Preterm Abortions TAB SAB Ect Mult Living   7 4 4  2 1 1   4       Last Pap: 01/2013 Results: Normal  Menarche age: 54 Regular  Patient's last menstrual period was 08/29/2013.    The following portions of the patient's history were reviewed and updated as appropriate: allergies, current medications, past family history, past medical history, past social history, past surgical history and problem list.  Review of Systems Pertinent items are noted in HPI.    Objective:    General appearance: alert and mildly obese Breasts: normal appearance, no masses or tenderness Abdomen: normal findings: soft, non-tender Pelvic: cervix normal in appearance, external genitalia normal, no adnexal masses or tenderness, no cervical motion tenderness, uterus normal size, shape, and consistency and vagina normal without discharge    Assessment:    Pregnancy at ~ [redacted]w[redacted]d weeks   Unsure LNMP.  Needs ultrasound for dating accuracy.  On multiple narcotics for muscle spasm condition, including Soma Compound, Klonopin, and Percocet.  Will need referral to MFM for consultation and advice.   Plan:    Initial labs drawn. Prenatal vitamins.  Counseling provided regarding continued use of seat belts, cessation of alcohol consumption, smoking or use of illicit drugs; infection precautions i.e., influenza/TDAP immunizations, toxoplasmosis,CMV, parvovirus, listeria and varicella; workplace safety, exercise during pregnancy; routine dental care, safe medications, sexual activity, hot tubs, saunas, pools, travel, caffeine use, fish  and methlymercury, potential toxins, hair treatments, varicose veins Weight gain recommendations reviewed: underweight/BMI< 18.5--> gain 28 - 40 lbs; normal weight/BMI 18.5 - 24.9--> gain 25 - 35 lbs; overweight/BMI 25 - 29.9--> gain 15 - 25 lbs; obese/BMI >30->gain  11 - 20 lbs Problem list reviewed and updated. AFP3 discussed: requested. Role of ultrasound in pregnancy discussed; fetal survey: requested. Amniocentesis discussed: not indicated. Follow up in 3 weeks. Refer to MFM for opiate dependency. 50% of 20 min visit spent on counseling and coordination of care.

## 2013-10-17 NOTE — Addendum Note (Signed)
Addended by: Glendell Docker on: 10/17/2013 04:49 PM   Modules accepted: Orders

## 2013-10-18 ENCOUNTER — Other Ambulatory Visit: Payer: No Typology Code available for payment source

## 2013-10-18 LAB — OBSTETRIC PANEL
Basophils Absolute: 0 10*3/uL (ref 0.0–0.1)
Basophils Relative: 0 % (ref 0–1)
Eosinophils Absolute: 0.2 10*3/uL (ref 0.0–0.7)
Hepatitis B Surface Ag: NEGATIVE
MCH: 26.3 pg (ref 26.0–34.0)
MCHC: 32.7 g/dL (ref 30.0–36.0)
Neutrophils Relative %: 58 % (ref 43–77)
Platelets: 229 10*3/uL (ref 150–400)
RBC: 4.95 MIL/uL (ref 3.87–5.11)
RDW: 14.1 % (ref 11.5–15.5)

## 2013-10-18 LAB — VITAMIN D 25 HYDROXY (VIT D DEFICIENCY, FRACTURES): Vit D, 25-Hydroxy: 23 ng/mL — ABNORMAL LOW (ref 30–89)

## 2013-10-18 LAB — GC/CHLAMYDIA PROBE AMP: CT Probe RNA: NEGATIVE

## 2013-10-18 LAB — VARICELLA ZOSTER ANTIBODY, IGG: Varicella IgG: >4000 Index — ABNORMAL HIGH (ref <135.00)

## 2013-10-18 LAB — CULTURE, OB URINE: Organism ID, Bacteria: NO GROWTH

## 2013-10-19 ENCOUNTER — Ambulatory Visit (HOSPITAL_COMMUNITY)
Admission: RE | Admit: 2013-10-19 | Discharge: 2013-10-19 | Disposition: A | Discharge status: Home / Self Care | Payer: Medicaid Other | Source: Ambulatory Visit | Attending: Obstetrics | Admitting: Obstetrics

## 2013-10-19 ENCOUNTER — Other Ambulatory Visit: Payer: No Typology Code available for payment source

## 2013-10-19 ENCOUNTER — Other Ambulatory Visit: Payer: Self-pay | Admitting: Obstetrics

## 2013-10-19 DIAGNOSIS — O99340 Other mental disorders complicating pregnancy, unspecified trimester: Secondary | ICD-10-CM | POA: Insufficient documentation

## 2013-10-19 DIAGNOSIS — O26841 Uterine size-date discrepancy, first trimester: Secondary | ICD-10-CM

## 2013-10-19 DIAGNOSIS — F1910 Other psychoactive substance abuse, uncomplicated: Secondary | ICD-10-CM | POA: Insufficient documentation

## 2013-10-19 DIAGNOSIS — O355XX1 Maternal care for (suspected) damage to fetus by drugs, fetus 1: Secondary | ICD-10-CM

## 2013-10-19 DIAGNOSIS — Z3687 Encounter for antenatal screening for uncertain dates: Secondary | ICD-10-CM

## 2013-10-19 DIAGNOSIS — O09899 Supervision of other high risk pregnancies, unspecified trimester: Secondary | ICD-10-CM | POA: Insufficient documentation

## 2013-10-19 DIAGNOSIS — O99891 Other specified diseases and conditions complicating pregnancy: Secondary | ICD-10-CM | POA: Insufficient documentation

## 2013-10-19 LAB — HEMOGLOBINOPATHY EVALUATION
Hemoglobin Other: 0 %
Hgb A: 97.8 % (ref 96.8–97.8)

## 2013-10-19 NOTE — Consult Note (Signed)
Maternal Fetal Medicine Consultation  Requesting Provider(s): Coral Ceo, MD  Reason for consultation: Hx of muscle spasms on multiple medications  HPI: Ashley Casey is a 32 yo [id number] currently at 7 2/7 weeks by dates who is referred for dating ultrasound and counseling due to multiple medications for "shoulder spasms".  She reports a 7+ year history of severe shoulder spasms.  She was worked up by Neurology in [place] - felt to have some kind of seizure activity that caused her symptoms.  She was later seen by Neurology here - had a normal MRI and EEG and did not feel that this was consistent with seizures.  The patient currently takes Soma and clonazepam for her severe muscle spasms.  She also reports that she takes Percocet 10/325 "a couple of times a week" for breakthrough pain due to spasms.  She is currently followed by her primary care physician who has been prescribing her medications.  During her previous pregnancy, her soma was discontinued and she was treated with Flexeril.  Since finding out that she was pregnant, she has decreased her soma and Clonazepam to once daily prior to bed.  She is without complaints today. She denies vaginal bleeding or abdominal pain.  OB History: OB History   Grav Para Term Preterm Abortions TAB SAB Ect Mult Living   7 4 4  2 1 1   4     Term SVD without complications x 4  PMH:  Past Medical History  Diagnosis Date  . Fatigue   . IBS (irritable bowel syndrome)   . Cholelithiasis   . Anxiety   . Neuromuscular disorder   . Seizures     PSH:  Past Surgical History  Procedure Laterality Date  . 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 Cholecystectomy  8/29/121  . Therapeutic abortion     Meds: Soma 350 mg BID (now taking at night only)             Clonazepam 2 mg TID (now taking only at night)             Percocet 10/325 prn for breakthrough pain             PNV             Vitamin C             Benadryl  Allergies:  Allergies   Allergen Reactions  . Tegretol [Carbamazepine]    FH: mother with Sickle cell disease - she reports that she has had carrier testing that was negative  Soc: denies tobacco, ETOH or illicit drug use  Review of Systems: no vaginal bleeding or cramping/contractions, no LOF, no nausea/vomiting. All other systems reviewed and are negative.   PE:  111/74, 93, Wt: 208#  GEN: well-appearing female ABD: gravid, NT  Ultrasound: Estimated gestational age at 21 2/7 weeks by dates A thickened endometrial stripe is visualized - a gestational sac is not clearly seen No cul-de-sac fluid noted The right ovary measures 3.5 x 2.9 x 3.3 cm with a simple appearing cyst; the left ovary measures 2.4 x 1.1 x 2.6 cm  A/P: 1) Likely early IUP - no gestational sac visualized.           2) Hx of severe muscle spasms on multiple medications.  Medication profile was reviewed with the patient.  Tresa Garter is not felt to increase the risk of birth defects; however, the data is somewhat limited.  In the patient's prior  pregnancy, soma was discontinued and she was maintained on Flexeril.  This may be a reasonable option if she is able to tolerate being off soma.  Clonazepam is not felt to increase risks of birth defects, but is associated with transient neonatal complications (hypotonia, mild respiratory distress) particularly with the combination of serotonin reuptake inhibitors.  The patient reports that she rarely takes Percocet - "a couple of times a week" - we discussed the risk of neonatal abstinence syndrome due to chronic narcotic use.    Recommendations: 1) Ultrasound findings discussed with Dr. Clearance Coots. The patient will go to his clinic to have serial Quant HCGs ordered.  Recommend a follow up ultrasound in 1-2 weeks to document viability (either in clinic or Radiology) 2) Detailed ultrasound at approximately 18 weeks with MFM 3) Consider stopping soma and switching to Flexeril  5-10 mg TID prn (if able to tolerate).  If is doubtful that she will be able to be weaned off Clonazepam per our discussion, but may consider this in the 3rd trimester due to increased risks of neonatal complications.   Thank you for the opportunity to be a part of the care of Ashley A Casey. Please contact our office if we can be of further assistance.   I spent approximately 30 minutes with this patient with over 50% of time spent in face-to-face counseling.

## 2013-10-21 ENCOUNTER — Other Ambulatory Visit: Payer: No Typology Code available for payment source

## 2013-10-21 ENCOUNTER — Telehealth: Payer: Self-pay | Admitting: *Deleted

## 2013-10-21 DIAGNOSIS — Z3481 Encounter for supervision of other normal pregnancy, first trimester: Secondary | ICD-10-CM

## 2013-10-21 MED ORDER — METRONIDAZOLE 500 MG PO TABS: 500.0000 mg | ORAL_TABLET | Freq: Two times a day (BID) | ORAL | Status: AC

## 2013-10-21 MED ORDER — VITAMIN D 1000 UNITS PO TABS: 1000.0000 [IU] | ORAL_TABLET | Freq: Every day | ORAL | Status: DC

## 2013-10-21 NOTE — Telephone Encounter (Signed)
Message copied by Glendell Docker on Fri Oct 21, 2013  9:36 AM ------      Message from: Coral Ceo A      Created: Tue Oct 18, 2013  6:55 PM       Flagyl 500mg  po bid x 7 days. ------

## 2013-10-21 NOTE — Telephone Encounter (Signed)
Patient was informed of lab results while in the office for a blood draw. She has verbalized understanding and agrees as instructed. Patient aware Rx sent to pharmacy.

## 2013-11-01 ENCOUNTER — Other Ambulatory Visit: Payer: No Typology Code available for payment source

## 2013-11-01 ENCOUNTER — Ambulatory Visit (INDEPENDENT_AMBULATORY_CARE_PROVIDER_SITE_OTHER): Payer: Medicaid Other

## 2013-11-01 ENCOUNTER — Encounter: Payer: Self-pay | Admitting: Obstetrics

## 2013-11-01 ENCOUNTER — Other Ambulatory Visit: Payer: Self-pay | Admitting: Obstetrics

## 2013-11-01 DIAGNOSIS — O365990 Maternal care for other known or suspected poor fetal growth, unspecified trimester, not applicable or unspecified: Secondary | ICD-10-CM

## 2013-11-01 DIAGNOSIS — Z3687 Encounter for antenatal screening for uncertain dates: Secondary | ICD-10-CM

## 2013-11-01 DIAGNOSIS — O200 Threatened abortion: Secondary | ICD-10-CM

## 2013-11-01 DIAGNOSIS — Z3481 Encounter for supervision of other normal pregnancy, first trimester: Secondary | ICD-10-CM

## 2013-11-01 LAB — US OB TRANSVAGINAL

## 2013-11-01 LAB — US OB COMP LESS 14 WKS

## 2013-11-02 ENCOUNTER — Other Ambulatory Visit: Payer: Self-pay | Admitting: *Deleted

## 2013-11-02 DIAGNOSIS — O200 Threatened abortion: Secondary | ICD-10-CM

## 2013-11-07 ENCOUNTER — Encounter: Payer: Self-pay | Admitting: Obstetrics

## 2013-11-07 ENCOUNTER — Ambulatory Visit (INDEPENDENT_AMBULATORY_CARE_PROVIDER_SITE_OTHER): Payer: No Typology Code available for payment source | Admitting: Obstetrics

## 2013-11-07 VITALS — BP 96/67 | Temp 97.7°F | Wt 207.0 lb

## 2013-11-07 DIAGNOSIS — Z3480 Encounter for supervision of other normal pregnancy, unspecified trimester: Secondary | ICD-10-CM

## 2013-11-07 DIAGNOSIS — Z3481 Encounter for supervision of other normal pregnancy, first trimester: Secondary | ICD-10-CM

## 2013-11-07 NOTE — Progress Notes (Signed)
P 84 Patient is having breast tenderness

## 2013-11-08 ENCOUNTER — Ambulatory Visit (HOSPITAL_COMMUNITY): Payer: PRIVATE HEALTH INSURANCE

## 2013-11-09 ENCOUNTER — Other Ambulatory Visit: Payer: No Typology Code available for payment source

## 2013-11-15 ENCOUNTER — Inpatient Hospital Stay (HOSPITAL_COMMUNITY)
Admission: AD | Admit: 2013-11-15 | Discharge: 2013-11-15 | Disposition: A | Discharge status: Home / Self Care | Payer: Medicaid Other | Source: Ambulatory Visit | Attending: Obstetrics | Admitting: Obstetrics

## 2013-11-15 ENCOUNTER — Encounter (HOSPITAL_COMMUNITY): Payer: Self-pay | Admitting: *Deleted

## 2013-11-15 ENCOUNTER — Inpatient Hospital Stay (HOSPITAL_COMMUNITY): Payer: Medicaid Other

## 2013-11-15 DIAGNOSIS — R109 Unspecified abdominal pain: Secondary | ICD-10-CM | POA: Insufficient documentation

## 2013-11-15 DIAGNOSIS — O034 Incomplete spontaneous abortion without complication: Secondary | ICD-10-CM

## 2013-11-15 DIAGNOSIS — O030 Genital tract and pelvic infection following incomplete spontaneous abortion: Secondary | ICD-10-CM | POA: Insufficient documentation

## 2013-11-15 DIAGNOSIS — B9689 Other specified bacterial agents as the cause of diseases classified elsewhere: Secondary | ICD-10-CM

## 2013-11-15 HISTORY — DX: Unspecified abnormal cytological findings in specimens from cervix uteri: R87.619

## 2013-11-15 HISTORY — DX: Reserved for concepts with insufficient information to code with codable children: IMO0002

## 2013-11-15 LAB — GC/CHLAMYDIA PROBE AMP
CT Probe RNA: NEGATIVE
GC Probe RNA: NEGATIVE

## 2013-11-15 LAB — CBC
HCT: 37.6 % (ref 36.0–46.0)
Hemoglobin: 12.6 g/dL (ref 12.0–15.0)
MCH: 27.1 pg (ref 26.0–34.0)
MCHC: 33.5 g/dL (ref 30.0–36.0)
MCV: 80.9 fL (ref 78.0–100.0)
RBC: 4.65 MIL/uL (ref 3.87–5.11)

## 2013-11-15 LAB — URINALYSIS, ROUTINE W REFLEX MICROSCOPIC
Bilirubin Urine: NEGATIVE
Glucose, UA: NEGATIVE mg/dL
Nitrite: NEGATIVE
Specific Gravity, Urine: >1.03 — ABNORMAL HIGH (ref 1.005–1.030)
pH: 6 (ref 5.0–8.0)

## 2013-11-15 LAB — URINE MICROSCOPIC-ADD ON

## 2013-11-15 MED ORDER — METRONIDAZOLE 500 MG PO TABS: 500.0000 mg | ORAL_TABLET | Freq: Two times a day (BID) | ORAL | Status: DC

## 2013-11-15 MED ORDER — IBUPROFEN 800 MG PO TABS: 800.0000 mg | ORAL_TABLET | Freq: Three times a day (TID) | ORAL | Status: DC | PRN

## 2013-11-15 MED ORDER — PROMETHAZINE HCL 25 MG PO TABS: 25.0000 mg | ORAL_TABLET | Freq: Four times a day (QID) | ORAL | Status: DC | PRN

## 2013-11-15 MED ORDER — MISOPROSTOL 200 MCG PO TABS: 800.0000 ug | ORAL_TABLET | Freq: Once | ORAL | Status: DC

## 2013-11-15 NOTE — MAU Note (Signed)
Lower abd pain started suddenly at 0045 on her left lower side; denies any bleeding and did not take anything for the pain.

## 2013-11-15 NOTE — MAU Note (Signed)
Pt reports lower abd pain x 1.5 hours,

## 2013-11-15 NOTE — MAU Provider Note (Signed)
Chief Complaint: Abdominal Pain  First Provider Initiated Contact with Patient 11/15/13 0225     SUBJECTIVE HPI: Ashley Casey is a 32 y.o. [id number] at [redacted]w[redacted]d by 6.1 week [place] who presents with low abd pain x 1 1/2 hours.  Started spotting when she went to the bathroom in MAU. Pain 8/10 on pain scale at worst. 6/10 now. Has been seen by Dr. Clearance Coots several times so far this pregnancy for abnormally rising quants, measuring size less than dates and concern about fetal medication exposure. Ultrasound 11/01/2013 showed a 6 week 1 day fetus with heart rate of 84. On pelvic exam 10/17/2013 gonorrhea and Chlamydia were negative.  Past Medical History  Diagnosis Date  . Fatigue   . IBS (irritable bowel syndrome)   . Cholelithiasis   . Anxiety   . Neuromuscular disorder   . Seizures   . Abnormal Pap smear    OB History  Gravida Para Term Preterm AB SAB TAB Ectopic Multiple Living  7 4 4  2 1 1   4     # Outcome Date GA Lbr Len/2nd Weight Sex Delivery Anes PTL Lv  7 CUR           6 TRM 05/08/10 [redacted]w[redacted]d  3.941 kg (8 lb 11 oz) M SVD EPI  Y  5 TRM 10/29/05 [redacted]w[redacted]d  3.289 kg (7 lb 4 oz) F SVD None  Y  4 TRM 03/21/03 [redacted]w[redacted]d  3.26 kg (7 lb 3 oz) F SVD None  Y  3 SAB 2003          2 TRM 09/04/97 [redacted]w[redacted]d  3.289 kg (7 lb 4 oz) M SVD None  Y  1 TAB              Past Surgical History  Procedure Laterality Date  . 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 Cholecystectomy  8/29/121  . Therapeutic abortion    . Wisdom tooth extraction     History   Social History  . Marital Status: Single    Spouse Name: N/A    Number of Children: N/A  . Years of Education: N/A   Occupational History  . Not on file.   Social History Main Topics  . Smoking status: Never Smoker   . Smokeless tobacco: Never Used  . Alcohol Use: No  . Drug Use: No  . Sexual Activity: Yes    Birth Control/ Protection: None     Comment: last intercourse one week ago   Other Topics Concern  . Not on file   Social History Narrative   . No narrative on file   No current facility-administered medications on file prior to encounter.   Current Outpatient Prescriptions on File Prior to Encounter  Medication Sig Dispense Refill  . Ascorbic Acid (VITAMIN C) 500 MG CAPS Take by mouth.      . carisoprodol (SOMA) 350 MG tablet Take 350 mg by mouth 3 (three) times daily as needed.       . clonazePAM (KLONOPIN) 1 MG tablet Take 2 mg by mouth 2 (two) times daily as needed (patient using prn in evening).       Marland Kitchen oxyCODONE-acetaminophen (PERCOCET) 10-325 MG per tablet Take 1 tablet by mouth every 4 (four) hours as needed for pain.      . Prenatal Multivit-Min-Fe-FA (PRE-NATAL PO) Take by mouth daily.        . cholecalciferol (VITAMIN D) 1000 UNITS tablet Take 1 tablet (1,000 Units total) by mouth  daily.      . DiphenhydrAMINE HCl (BENADRYL ALLERGY PO) Take by mouth.       Allergies  Allergen Reactions  . Tegretol [Carbamazepine]     ROS: Pertinent items in HPI. Pos malodorous vaginal discharge. Denies fever, chills, passage of tissue, urinary complaints, GI complaints.   OBJECTIVE Blood pressure 122/78, pulse 69, temperature 99.2 F (37.3 C), temperature source Oral, resp. rate 20, height 5\' 4"  (1.626 m), weight 93.441 kg (206 lb), last menstrual period 08/29/2013, SpO2 100.00%. GENERAL: Well-developed, well-nourished female in mild distress.  HEENT: Normocephalic HEART: normal rate RESP: normal effort ABDOMEN: Soft, obese, mild right lower quadrant/groin tenderness. Negative rebound, negative mass. Positive bowel sounds. EXTREMITIES: Nontender, no edema NEURO: Alert and oriented SPECULUM EXAM: NEFG, small amount of creamy, pink, malodorous discharge coming from os, cervix clean, nonfriable BIMANUAL: cervix closed; UTA uterine size due to body habitus, mild bilat adnexal tenderness. No masses. No CMT.   LAB RESULTS Results for orders placed during the hospital encounter of 11/15/13 (from the past 24 hour(s))  URINALYSIS,  ROUTINE W REFLEX MICROSCOPIC     Status: Abnormal   Collection Time    11/15/13  1:50 AM      Result Value Range   Color, Urine YELLOW  YELLOW   APPearance CLEAR  CLEAR   Specific Gravity, Urine >1.030 (*) 1.005 - 1.030   pH 6.0  5.0 - 8.0   Glucose, UA NEGATIVE  NEGATIVE mg/dL   Hgb urine dipstick SMALL (*) NEGATIVE   Bilirubin Urine NEGATIVE  NEGATIVE   Ketones, ur NEGATIVE  NEGATIVE mg/dL   Protein, ur NEGATIVE  NEGATIVE mg/dL   Urobilinogen, UA 0.2  0.0 - 1.0 mg/dL   Nitrite NEGATIVE  NEGATIVE   Leukocytes, UA NEGATIVE  NEGATIVE  URINE MICROSCOPIC-ADD ON     Status: None   Collection Time    11/15/13  1:50 AM      Result Value Range   Squamous Epithelial / LPF RARE  RARE   WBC, UA 0-2  <3 WBC/hpf   RBC / HPF 0-2  <3 RBC/hpf   Bacteria, UA RARE  RARE  WET PREP, GENITAL     Status: Abnormal   Collection Time    11/15/13  2:35 AM      Result Value Range   Yeast Wet Prep HPF POC NONE SEEN  NONE SEEN   Trich, Wet Prep NONE SEEN  NONE SEEN   Clue Cells Wet Prep HPF POC MANY (*) NONE SEEN   WBC, Wet Prep HPF POC FEW (*) NONE SEEN  CBC     Status: Abnormal   Collection Time    11/15/13  5:07 AM      Result Value Range   WBC 11.1 (*) 4.0 - 10.5 K/uL   RBC 4.65  3.87 - 5.11 MIL/uL   Hemoglobin 12.6  12.0 - 15.0 g/dL   HCT 16.1  09.6 - 04.5 %   MCV 80.9  78.0 - 100.0 fL   MCH 27.1  26.0 - 34.0 pg   MCHC 33.5  30.0 - 36.0 g/dL   RDW 40.9  81.1 - 91.4 %   Platelets 210  150 - 400 K/uL    IMAGING US Ob Transvaginal  11/15/2013   ADDENDUM REPORT: 11/15/2013 05:04  ADDENDUM: A report and limited images from an outside obstetrical ultrasound dated 11/01/2013 was obtained. This study demonstrated an intrauterine pregnancy with a crown-rump length of 4.8 mm consistent with estimated gestational age of [redacted] weeks  1 day and the fetal heart rate 84 beats per min. Comparison with today's study in the previous study suggests lack of appropriate interval growth, coupled with  nonvisualization of fetal heart rate today. Findings are highly suspicious for intrauterine fetal demise.   Electronically Signed   By: Burman Nieves M.D.   On: 11/15/2013 05:04   11/15/2013   CLINICAL DATA:  Pelvic pain and spotting. Reported estimated gestational age by previous ultrasound (not available for review) is 8 weeks 1 day.  EXAM: TRANSVAGINAL OB ULTRASOUND  TECHNIQUE: Transvaginal ultrasound was performed for complete evaluation of the gestation as well as the maternal uterus, adnexal regions, and pelvic cul-de-sac.  COMPARISON:  Ultrasound from center for maternal fetal care dated 10/19/2013  FINDINGS: Intrauterine gestational sac: A single ovoid intrauterine gestational sac is visualized.  Yolk sac:  The yolk sac is visualized.  Embryo:  Fetal pole is visualized.  Cardiac Activity: No fetal cardiac activity is observed.  Heart Rate: Absent bpm  CRL:   3.1  mm   6 w 0 d                  [place] EDC: 07/11/2014  Maternal uterus/adnexae: No evidence of myometrial mass or subchorionic hemorrhage. The ovaries appear unremarkable. There is a cyst in the right ovary measuring about 2.5 cm in diameter, likely representing a corpus luteum cyst.  IMPRESSION: Fetal cardiac activity is not identified. Crown-rump length suggest an estimated gestational age of [redacted] weeks 0 days. Findings are suspicious but not yet definitive for failed pregnancy. This could still represent an early intrauterine pregnancy, too small to see the heart rate. Recommend follow-up US in 10-14 days for definitive diagnosis. This recommendation follows SRU consensus guidelines: Diagnostic Criteria for Nonviable Pregnancy Early in the First Trimester. Malva Limes Med 2013; 960:4540-98.  Electronically Signed: By: Burman Nieves M.D. On: 11/15/2013 03:32   MAU COURSE Ultrasound ordered.  Pt informed of US findings C/W missed AB. Tearful, but appropriate. Recommended that she call Dr. Verdell Carmine office when it opens to schedule appt to discuss  any additional testing and management options. Pt very distraught about expectantly managing SAB. Repeatedly requested cytotec. Again suggested that she call [facility] for Rx. Pt worried about not getting Rx before holiday. CNM will Rx Cytotec, but again strongly recommended that pt call office to talk to her provider.   ASSESSMENT 1. Incomplete miscarriage   2. BV (bacterial vaginosis)    PLAN Discharge home in stable condition.  Support given. Declined Chaplain.  Bleeding precautions.  Work note given.  In-basket message sent to Dr. Clearance Coots.      Follow-up Information   Follow up with HARPER,CHARLES A, MD. (call as soon as the office opens to discuss any further testing and management. )    Specialty:  Obstetrics and Gynecology   Contact information:   [address] [place] [address] [phone]       Follow up with [facility]  MATERNITY ADMISSIONS. (As needed in emergencies)    Contact information:   [address] [id number] [place] [address] [phone]       Medication List         BENADRYL ALLERGY PO  Take by mouth.     carisoprodol 350 MG tablet  Commonly known as:  SOMA  Take 350 mg by mouth 3 (three) times daily as needed.     cholecalciferol 1000 UNITS tablet  Commonly known as:  VITAMIN  D  Take 1 tablet (1,000 Units total) by mouth daily.     clonazePAM 1 MG tablet  Commonly known as:  KLONOPIN  Take 2 mg by mouth 2 (two) times daily as needed (patient using prn in evening).     ibuprofen 800 MG tablet  Commonly known as:  ADVIL,MOTRIN  Take 1 tablet (800 mg total) by mouth every 8 (eight) hours as needed.     metroNIDAZOLE 500 MG tablet  Commonly known as:  FLAGYL  Take 1 tablet (500 mg total) by mouth 2 (two) times daily.     misoprostol 200 MCG tablet  Commonly known as:  CYTOTEC  Take 4 tablets (800 mcg total) by mouth once.     oxyCODONE-acetaminophen 10-325 MG per tablet  Commonly  known as:  PERCOCET  Take 1 tablet by mouth every 4 (four) hours as needed for pain.     PRE-NATAL PO  Take by mouth daily.     promethazine 25 MG tablet  Commonly known as:  PHENERGAN  Take 1 tablet (25 mg total) by mouth every 6 (six) hours as needed for nausea or vomiting.     Vitamin C 500 MG Caps  Take by mouth.       Kirby, CNM 11/15/2013  5:55 AM

## 2013-11-21 ENCOUNTER — Other Ambulatory Visit: Payer: Self-pay | Admitting: *Deleted

## 2013-11-21 DIAGNOSIS — B373 Candidiasis of vulva and vagina: Secondary | ICD-10-CM

## 2013-11-21 MED ORDER — FLUCONAZOLE 150 MG PO TABS: 150.0000 mg | ORAL_TABLET | Freq: Once | ORAL | Status: DC

## 2013-11-24 ENCOUNTER — Encounter: Payer: Self-pay | Admitting: Obstetrics

## 2013-11-24 ENCOUNTER — Encounter: Payer: Self-pay | Admitting: *Deleted

## 2013-11-24 ENCOUNTER — Ambulatory Visit (INDEPENDENT_AMBULATORY_CARE_PROVIDER_SITE_OTHER): Payer: No Typology Code available for payment source | Admitting: Obstetrics

## 2013-11-24 VITALS — BP 109/70 | HR 87 | Temp 97.5°F | Wt 208.0 lb

## 2013-11-24 DIAGNOSIS — O039 Complete or unspecified spontaneous abortion without complication: Secondary | ICD-10-CM

## 2013-11-24 NOTE — Progress Notes (Signed)
Subjective:     Ashley Casey is a 32 y.o. female here for a routine exam.  Current complaints: Patient is in the office for follow up to miscarriage. Patient was seen at the hospital on 11/25 and ws told her pregnancy had ended. Patent was given Cytotec and was told to use to pass the pregnancy. Patient put the pills in 11/26. Patient started bleeding 3 hours after insertion. Patient states she has continued to bleed until last night. Patient states she passed large clots and has had significant cramping. Patient is upset over her loss and she has questions about her ability to conceive in the future. Personal health questionnaire reviewed: yes.   Gynecologic History Patient's last menstrual period was 08/29/2013. Contraception: none   Obstetric History OB History  Gravida Para Term Preterm AB SAB TAB Ectopic Multiple Living  7 4 4  2 1 1   4     # Outcome Date GA Lbr Len/2nd Weight Sex Delivery Anes PTL Lv  7 TRM 05/08/10 [redacted]w[redacted]d  8 lb 11 oz (3.941 kg) M SVD EPI  Y  6 TRM 10/29/05 [redacted]w[redacted]d  7 lb 4 oz (3.289 kg) F SVD None  Y  5 TRM 03/21/03 [redacted]w[redacted]d  7 lb 3 oz (3.26 kg) F SVD None  Y  4 SAB 2003          3 TRM 09/04/97 [redacted]w[redacted]d  7 lb 4 oz (3.289 kg) M SVD None  Y  2 GRA              Comments: System Generated. Please review and update pregnancy details.  1 TAB                The following portions of the patient's history were reviewed and updated as appropriate: allergies, current medications, past family history, past medical history, past social history, past surgical history and problem list.  Review of Systems Pertinent items are noted in HPI.    Objective:    General appearance: alert and no distress Abdomen: normal findings: soft, non-tender Pelvic: cervix normal in appearance, external genitalia normal, no adnexal masses or tenderness, no cervical motion tenderness, rectovaginal septum normal, uterus normal size, shape, and consistency and vagina normal without discharge     Assessment:    SAB, complete.  Doing well.   Plan:    Education reviewed: safe sex/STD prevention and management of SAB. Contraception: abstinence. Follow up in: 2 weeks.

## 2013-11-29 ENCOUNTER — Ambulatory Visit: Payer: No Typology Code available for payment source | Admitting: Obstetrics

## 2013-12-08 ENCOUNTER — Ambulatory Visit (INDEPENDENT_AMBULATORY_CARE_PROVIDER_SITE_OTHER): Payer: Medicaid Other | Admitting: Obstetrics

## 2013-12-08 ENCOUNTER — Encounter: Payer: Self-pay | Admitting: Obstetrics

## 2013-12-08 DIAGNOSIS — O039 Complete or unspecified spontaneous abortion without complication: Secondary | ICD-10-CM

## 2013-12-08 NOTE — Progress Notes (Signed)
Subjective:     Ashley Casey is a 32 y.o. female here for a routine exam.  Current complaints: follow up to a miscarriage. Pt states she had unprotected intercourse on 12-04-13 about 3 weeks after her miscarriage.  Personal health questionnaire reviewed: yes.   Gynecologic History No LMP recorded. Contraception: none   Obstetric History OB History  Gravida Para Term Preterm AB SAB TAB Ectopic Multiple Living  7 4 4  2 1 1   4     # Outcome Date GA Lbr Len/2nd Weight Sex Delivery Anes PTL Lv  7 TRM 05/08/10 [redacted]w[redacted]d  8 lb 11 oz (3.941 kg) M SVD EPI  Y  6 TRM 10/29/05 [redacted]w[redacted]d  7 lb 4 oz (3.289 kg) F SVD None  Y  5 TRM 03/21/03 [redacted]w[redacted]d  7 lb 3 oz (3.26 kg) F SVD None  Y  4 SAB 2003          3 TRM 09/04/97 [redacted]w[redacted]d  7 lb 4 oz (3.289 kg) M SVD None  Y  2 GRA              Comments: System Generated. Please review and update pregnancy details.  1 TAB                The following portions of the patient's history were reviewed and updated as appropriate: allergies, current medications, past family history, past medical history, past social history, past surgical history and problem list.  Review of Systems Pertinent items are noted in HPI.    Objective:    General appearance: alert and no distress Abdomen: normal findings: soft, non-tender Pelvic: cervix normal in appearance, external genitalia normal, no adnexal masses or tenderness, no cervical motion tenderness, rectovaginal septum normal, uterus normal size, shape, and consistency and vagina normal without discharge    Assessment:    Healthy female exam.   S/P spontaneous SAB, complete.  Doing well.   Plan:    Education reviewed: safe sex/STD prevention. Follow up in: several months.

## 2013-12-09 ENCOUNTER — Other Ambulatory Visit: Payer: Self-pay | Admitting: *Deleted

## 2013-12-09 ENCOUNTER — Other Ambulatory Visit: Payer: Self-pay | Admitting: Advanced Practice Midwife

## 2013-12-09 DIAGNOSIS — B9689 Other specified bacterial agents as the cause of diseases classified elsewhere: Secondary | ICD-10-CM

## 2013-12-09 DIAGNOSIS — O034 Incomplete spontaneous abortion without complication: Secondary | ICD-10-CM

## 2013-12-09 MED ORDER — IBUPROFEN 800 MG PO TABS: 800.0000 mg | ORAL_TABLET | Freq: Three times a day (TID) | ORAL | Status: DC | PRN

## 2013-12-22 NOTE — L&D Delivery Note (Signed)
Delivery Note Pt progressed to complete and pushed well.  At 7:59 PM a viable female was delivered via Vaginal, Spontaneous Delivery (Presentation: Left Occiput Anterior).  APGAR: 8, 9; weight pending .   Placenta status: Intact, Spontaneous.  Cord:  with the following complications: None.  Anesthesia: Epidural  Episiotomy: None Lacerations: None Suture Repair: none Est. Blood Loss (mL): 300  Mom to postpartum.  Baby to Couplet care / Skin to Skin.  Will do circumcision in the office.    Brigette Hopfer D 11/18/2014, 8:17 PM

## 2013-12-26 ENCOUNTER — Encounter: Payer: Self-pay | Admitting: Obstetrics

## 2014-02-13 ENCOUNTER — Ambulatory Visit (INDEPENDENT_AMBULATORY_CARE_PROVIDER_SITE_OTHER): Payer: No Typology Code available for payment source | Admitting: Emergency Medicine

## 2014-02-13 VITALS — BP 118/72 | HR 73 | Temp 97.5°F | Resp 16 | Ht 63.5 in | Wt 214.0 lb

## 2014-02-13 DIAGNOSIS — N912 Amenorrhea, unspecified: Secondary | ICD-10-CM

## 2014-02-13 DIAGNOSIS — R350 Frequency of micturition: Secondary | ICD-10-CM

## 2014-02-13 DIAGNOSIS — IMO0001 Reserved for inherently not codable concepts without codable children: Secondary | ICD-10-CM

## 2014-02-13 LAB — POCT URINALYSIS DIPSTICK
Bilirubin, UA: NEGATIVE
Blood, UA: NEGATIVE
GLUCOSE UA: NEGATIVE
KETONES UA: NEGATIVE
LEUKOCYTES UA: NEGATIVE
Nitrite, UA: NEGATIVE
PROTEIN UA: NEGATIVE
SPEC GRAV UA: 1.025
Urobilinogen, UA: 1
pH, UA: 6.5

## 2014-02-13 LAB — POCT UA - MICROSCOPIC ONLY
Amorphous: POSITIVE
CASTS, UR, LPF, POC: NEGATIVE
Crystals, Ur, HPF, POC: NEGATIVE
RBC, urine, microscopic: NEGATIVE
Yeast, UA: NEGATIVE

## 2014-02-13 LAB — POCT URINE PREGNANCY: PREG TEST UR: NEGATIVE

## 2014-02-13 NOTE — Progress Notes (Signed)
[facility] [address], [place] [address] [phone]  Date:  02/13/2014   Name:  Ashley Casey   DOB:  04-06-1981   MRN:  [id number]  PCP:  Kristine Garbe, MD    Chief Complaint: Amenorrhea   History of Present Illness:  Ashley Casey is a 33 y.o. very pleasant female patient who presents with the following:  Concerned she may be pregnant as she "has all the symptoms" but her home test was negative.  [id number].  LMP 01/01/14.  Breasts are tender and has nausea.  Feels premenstrual.  No improvement with over the counter medications or other home remedies. Denies other complaint or health concern today.   Patient Active Problem List   Diagnosis Date Noted  . 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 Symptomatic cholelithiasis 07/28/2011    Past Medical History  Diagnosis Date  . Fatigue   . IBS (irritable bowel syndrome)   . Cholelithiasis   . Anxiety   . Neuromuscular disorder   . Seizures   . Abnormal Pap smear     Past Surgical History  Procedure Laterality Date  . 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 Cholecystectomy  8/29/121  . Therapeutic abortion    . Wisdom tooth extraction      History  Substance Use Topics  . Smoking status: Never Smoker   . Smokeless tobacco: Never Used  . Alcohol Use: No    Family History  Problem Relation Age of Onset  . Other Mother     sickle cell anemia  . Arthritis Brother   . Asthma Brother     Allergies  Allergen Reactions  . Tegretol [Carbamazepine]     Medication list has been reviewed and updated.  Current Outpatient Prescriptions on File Prior to Visit  Medication Sig Dispense Refill  . Ascorbic Acid (VITAMIN C) 500 MG CAPS Take by mouth.      . cholecalciferol (VITAMIN D) 1000 UNITS tablet Take 1 tablet (1,000 Units total) by mouth daily.      . clonazePAM (KLONOPIN) 1 MG tablet Take 2 mg by mouth 2 (two) times daily as needed (patient using prn in evening).       Marland Kitchen  ibuprofen (ADVIL,MOTRIN) 800 MG tablet TAKE ONE TABLET BY MOUTH EVERY 8 HOURS AS NEEDED  30 tablet  0  . Prenatal Multivit-Min-Fe-FA (PRE-NATAL PO) Take by mouth daily.        . carisoprodol (SOMA) 350 MG tablet Take 350 mg by mouth 3 (three) times daily as needed.       . promethazine (PHENERGAN) 25 MG tablet TAKE ONE TABLET BY MOUTH EVERY 6 HOURS AS NEEDED FOR NAUSEA AND VOMITING  30 tablet  0   No current facility-administered medications on file prior to visit.    Review of Systems:  As per HPI, otherwise negative.    Physical Examination: Filed Vitals:   02/13/14 1252  BP: 118/72  Pulse: 73  Temp: 97.5 F (36.4 C)  Resp: 16   Filed Vitals:   02/13/14 1252  Height: 5' 3.5" (1.613 m)  Weight: 214 lb (97.07 kg)   Body mass index is 37.31 kg/(m^2). Ideal Body Weight: Weight in (lb) to have BMI = 25: 143.1   GEN: WDWN, NAD, Non-toxic, Alert & Oriented x 3 HEENT: Atraumatic, Normocephalic.  Ears and Nose: No external deformity. EXTR: No clubbing/cyanosis/edema NEURO: Normal gait.  PSYCH: Normally interactive. Conversant. Not depressed or anxious appearing.  Calm  demeanor.    Assessment and Plan: Amenorrhea  Signed,  Ellison Carwin, MD   Results for orders placed in visit on 02/13/14  POCT URINALYSIS DIPSTICK      Result Value Ref Range   Color, UA dk yellow     Clarity, UA clear     Glucose, UA neg     Bilirubin, UA neg     Ketones, UA neg     Spec Grav, UA 1.025     Blood, UA neg     pH, UA 6.5     Protein, UA neg     Urobilinogen, UA 1.0     Nitrite, UA neg     Leukocytes, UA Negative    POCT URINE PREGNANCY      Result Value Ref Range   Preg Test, Ur Negative    POCT UA - MICROSCOPIC ONLY      Result Value Ref Range   WBC, Ur, HPF, POC 0-1     RBC, urine, microscopic neg     Bacteria, U Microscopic 2+     Mucus, UA mod     Epithelial cells, urine per micros 2-12     Crystals, Ur, HPF, POC neg     Casts, Ur, LPF, POC neg     Yeast, UA neg      Amorphous pos

## 2014-02-13 NOTE — Patient Instructions (Signed)
\  Secondary Amenorrhea   Secondary amenorrhea is the stopping of menstrual flow for 3 6 months in a female who has previously had periods. There are many possible causes. Most of these causes are not serious. Usually, treating the underlying problem causing the loss of menses will return your periods to normal.  CAUSES   Some common and uncommon causes of not menstruating include:   Malnutrition.   Low blood sugar (hypoglycemia).   Polycystic ovary disease.   Stress or fear.   Breastfeeding.   Hormone imbalance.   Ovarian failure.   Medicines.   Extreme obesity.   Cystic fibrosis.   Low body weight or drastic weight reduction from any cause.   Early menopause.   Removal of ovaries or uterus.   Contraceptives.   Illness.   Long-term (chronic) illnesses.   Cushing syndrome.   Thyroid problems.   Birth control pills, patches, or vaginal rings for birth control.  RISK FACTORS  You may be at greater risk of secondary amenorrhea if:   You have a family history of this condition.   You have an eating disorder.   You do athletic training.  DIAGNOSIS   A diagnosis is made by your health care provider taking a medical history and doing a physical exam. This will include a pelvic exam to check for problems with your reproductive organs. Pregnancy must be ruled out. Often, numerous blood tests are done to measure different hormones in the body. Urine testing may be done. Specialized exams (ultrasound, CT scan, MRI, or hysteroscopy) may have to be done as well as measuring the body mass index (BMI).  TREATMENT   Treatment depends on the cause of the amenorrhea. If an eating disorder is present, this can be treated with an adequate diet and therapy. Chronic illnesses may improve with treatment of the illness. Amenorrhea may be corrected with medicines, lifestyle changes, or surgery. If the amenorrhea cannot be corrected, it is sometimes possible to create a false menstruation with medicines.  HOME CARE  INSTRUCTIONS   Maintain a healthy diet.   Manage weight problems.   Exercise regularly but not excessively.   Get adequate sleep.   Manage stress.   Be aware of changes in your menstrual cycle. Keep a record of when your periods occur. Note the date your period starts, how long it lasts, and any problems.  SEEK MEDICAL CARE IF:  Your symptoms do not get better with treatment.  Document Released: 01/19/2007 Document Revised: 08/10/2013 Document Reviewed: 05/26/2013  Exit[employer] Patient Information 2014 [employer].

## 2014-02-14 LAB — HCG, QUANTITATIVE, PREGNANCY

## 2014-03-23 ENCOUNTER — Encounter (HOSPITAL_COMMUNITY): Payer: Self-pay | Admitting: *Deleted

## 2014-03-23 ENCOUNTER — Inpatient Hospital Stay (HOSPITAL_COMMUNITY)
Admission: AD | Admit: 2014-03-23 | Discharge: 2014-03-24 | Disposition: A | Discharge status: Home / Self Care | Payer: No Typology Code available for payment source | Source: Ambulatory Visit | Attending: Obstetrics | Admitting: Obstetrics

## 2014-03-23 DIAGNOSIS — R5383 Other fatigue: Secondary | ICD-10-CM

## 2014-03-23 DIAGNOSIS — Z3201 Encounter for pregnancy test, result positive: Secondary | ICD-10-CM | POA: Insufficient documentation

## 2014-03-23 DIAGNOSIS — K589 Irritable bowel syndrome without diarrhea: Secondary | ICD-10-CM | POA: Insufficient documentation

## 2014-03-23 DIAGNOSIS — K8020 Calculus of gallbladder without cholecystitis without obstruction: Secondary | ICD-10-CM | POA: Insufficient documentation

## 2014-03-23 DIAGNOSIS — R5381 Other malaise: Secondary | ICD-10-CM | POA: Diagnosis not present

## 2014-03-23 DIAGNOSIS — Z3200 Encounter for pregnancy test, result unknown: Secondary | ICD-10-CM | POA: Diagnosis present

## 2014-03-23 LAB — URINALYSIS, ROUTINE W REFLEX MICROSCOPIC
Bilirubin Urine: NEGATIVE
GLUCOSE, UA: NEGATIVE mg/dL
Hgb urine dipstick: NEGATIVE
KETONES UR: 15 mg/dL — AB
LEUKOCYTES UA: NEGATIVE
NITRITE: NEGATIVE
Protein, ur: NEGATIVE mg/dL
Specific Gravity, Urine: >1.03 — ABNORMAL HIGH (ref 1.005–1.030)
Urobilinogen, UA: 0.2 mg/dL (ref 0.0–1.0)
pH: 5.5 (ref 5.0–8.0)

## 2014-03-23 LAB — POCT PREGNANCY, URINE: PREG TEST UR: POSITIVE — AB

## 2014-03-23 NOTE — MAU Note (Addendum)
PT SAYS LMP WAS 2-27.  DID HPT-   TODAY- POSTIVE.  NO BIRTH CONTROL.   LAST SEX-    Sunday.     HAS BEEN DOING   WT LOSS. PROGRAM.

## 2014-03-24 ENCOUNTER — Encounter (HOSPITAL_COMMUNITY): Payer: Self-pay

## 2014-03-24 DIAGNOSIS — Z3201 Encounter for pregnancy test, result positive: Secondary | ICD-10-CM | POA: Diagnosis not present

## 2014-03-24 MED ORDER — PROMETHAZINE HCL 12.5 MG PO TABS: 25.0000 mg | ORAL_TABLET | Freq: Four times a day (QID) | ORAL | Status: DC | PRN

## 2014-03-24 MED ORDER — PROMETHAZINE HCL 25 MG PO TABS: 25.0000 mg | ORAL_TABLET | Freq: Four times a day (QID) | ORAL | Status: DC | PRN

## 2014-03-24 NOTE — MAU Provider Note (Signed)
History     CSN: [id number]  Arrival date and time: 03/23/14 2250   None     Chief Complaint  Patient presents with  . Routine Prenatal Visit  . Weight Loss   HPI  Ashley Casey is a 33 y.o. [id number] at [redacted]w[redacted]d who presents today because she just had a positive pregnancy test. She states that she has been using a "laser liposuction", and was concerned about using that during pregnancy. She denies any pain or bleeding at this time.   Past Medical History  Diagnosis Date  . Fatigue   . IBS (irritable bowel syndrome)   . Cholelithiasis   . Anxiety   . Neuromuscular disorder   . Seizures   . Abnormal Pap smear     Past Surgical History  Procedure Laterality Date  . 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 Cholecystectomy  8/29/121  . Therapeutic abortion    . Wisdom tooth extraction      Family History  Problem Relation Age of Onset  . Other Mother     sickle cell anemia  . Arthritis Brother   . Asthma Brother     History  Substance Use Topics  . Smoking status: Never Smoker   . Smokeless tobacco: Never Used  . Alcohol Use: No    Allergies:  Allergies  Allergen Reactions  . Orange Juice [Orange Oil]     Can't breathe  . Grapefruit Concentrate     Can't breathe  . Tegretol [Carbamazepine]     Prescriptions prior to admission  Medication Sig Dispense Refill  . Ascorbic Acid (VITAMIN C) 500 MG CAPS Take by mouth.      . carisoprodol (SOMA) 350 MG tablet Take 350 mg by mouth 3 (three) times daily as needed.       . cholecalciferol (VITAMIN D) 1000 UNITS tablet Take 1 tablet (1,000 Units total) by mouth daily.      . clonazePAM (KLONOPIN) 1 MG tablet Take 2 mg by mouth 2 (two) times daily as needed (patient using prn in evening).       . naproxen (NAPROSYN) 500 MG tablet Take 500 mg by mouth 2 (two) times daily with a meal.      . Prenatal Multivit-Min-Fe-FA (PRE-NATAL PO) Take by mouth daily.        Marland Kitchen ibuprofen (ADVIL,MOTRIN) 800 MG tablet TAKE ONE TABLET  BY MOUTH EVERY 8 HOURS AS NEEDED  30 tablet  0  . promethazine (PHENERGAN) 25 MG tablet TAKE ONE TABLET BY MOUTH EVERY 6 HOURS AS NEEDED FOR NAUSEA AND VOMITING  30 tablet  0    ROS Physical Exam   Blood pressure 108/71, pulse 78, temperature 98.6 F (37 C), temperature source Oral, resp. rate 18, height 5\' 2"  (1.575 m), weight 94.575 kg (208 lb 8 oz), last menstrual period 02/17/2014.  Physical Exam  Nursing note and vitals reviewed. Constitutional: She is oriented to person, place, and time. She appears well-developed and well-nourished. No distress.  Cardiovascular: Normal rate.   Respiratory: Effort normal.  GI: Soft. There is no tenderness.  Neurological: She is oriented to person, place, and time.  Skin: Skin is warm and dry.  Psychiatric: She has a normal mood and affect.    MAU Course  Procedures  Results for orders placed during the hospital encounter of 03/23/14 (from the past 24 hour(s))  URINALYSIS, ROUTINE W REFLEX MICROSCOPIC     Status: Abnormal   Collection Time    03/23/14 11:15  PM      Result Value Ref Range   Color, Urine YELLOW  YELLOW   APPearance CLEAR  CLEAR   Specific Gravity, Urine >1.030 (*) 1.005 - 1.030   pH 5.5  5.0 - 8.0   Glucose, UA NEGATIVE  NEGATIVE mg/dL   Hgb urine dipstick NEGATIVE  NEGATIVE   Bilirubin Urine NEGATIVE  NEGATIVE   Ketones, ur 15 (*) NEGATIVE mg/dL   Protein, ur NEGATIVE  NEGATIVE mg/dL   Urobilinogen, UA 0.2  0.0 - 1.0 mg/dL   Nitrite NEGATIVE  NEGATIVE   Leukocytes, UA NEGATIVE  NEGATIVE  POCT PREGNANCY, URINE     Status: Abnormal   Collection Time    03/23/14 11:23 PM      Result Value Ref Range   Preg Test, Ur POSITIVE (*) NEGATIVE     Assessment and Plan   1. Encounter for pregnancy test, result positive    First trimester danger signs Start [facility] as soon as possible Return to MAU as needed Viability/dating [place] in 2 weeks   Mathis Bud 03/24/2014, 1:31 AM

## 2014-03-24 NOTE — MAU Note (Signed)
Pt states she has used "laser weight loss treatment' prior to finding out that she was pregnant.

## 2014-03-24 NOTE — Discharge Instructions (Signed)
Pregnancy - First Trimester  During sexual intercourse, millions of sperm go into the vagina. Only 1 sperm will penetrate and fertilize the female egg while it is in the Fallopian tube. One week later, the fertilized egg implants into the wall of the uterus. An embryo begins to develop into a baby. At 6 to 8 weeks, the eyes and face are formed and the heartbeat can be seen on ultrasound. At the end of 12 weeks (first trimester), all the baby's organs are formed. Now that you are pregnant, you will want to do everything you can to have a healthy baby. Two of the most important things are to get good prenatal care and follow your caregiver's instructions. Prenatal care is all the medical care you receive before the baby's birth. It is given to prevent, find, and treat problems during the pregnancy and childbirth.  PRENATAL EXAMS  · During prenatal visits, your weight, blood pressure, and urine are checked. This is done to make sure you are healthy and progressing normally during the pregnancy.  · A pregnant woman should gain 25 to 35 pounds during the pregnancy. However, if you are overweight or underweight, your caregiver will advise you regarding your weight.  · Your caregiver will ask and answer questions for you.  · Blood work, cervical cultures, other necessary tests, and a Pap test are done during your prenatal exams. These tests are done to check on your health and the probable health of your baby. Tests are strongly recommended and done for HIV with your permission. This is the virus that causes AIDS. These tests are done because medicines can be given to help prevent your baby from being born with this infection should you have been infected without knowing it. Blood work is also used to find out your blood type, previous infections, and follow your blood levels (hemoglobin).  · Low hemoglobin (anemia) is common during pregnancy. Iron and vitamins are given to help prevent this. Later in the pregnancy, blood  tests for diabetes will be done along with any other tests if any problems develop.  · You may need other tests to make sure you and the baby are doing well.  CHANGES DURING THE FIRST TRIMESTER   Your body goes through many changes during pregnancy. They vary from person to person. Talk to your caregiver about changes you notice and are concerned about. Changes can include:  · Your menstrual period stops.  · The egg and sperm carry the genes that determine what you look like. Genes from you and your partner are forming a baby. The female genes determine whether the baby is a boy or a girl.  · Your body increases in girth and you may feel bloated.  · Feeling sick to your stomach (nauseous) and throwing up (vomiting). If the vomiting is uncontrollable, call your caregiver.  · Your breasts will begin to enlarge and become tender.  · Your nipples may stick out more and become darker.  · The need to urinate more. Painful urination may mean you have a bladder infection.  · Tiring easily.  · Loss of appetite.  · Cravings for certain kinds of food.  · At first, you may gain or lose a couple of pounds.  · You may have changes in your emotions from day to day (excited to be pregnant or concerned something may go wrong with the pregnancy and baby).  · You may have more vivid and strange dreams.  HOME CARE INSTRUCTIONS   ·   It is very important to avoid all smoking, alcohol and non-prescribed drugs during your pregnancy. These affect the formation and growth of the baby. Avoid chemicals while pregnant to ensure the delivery of a healthy infant.  · Start your prenatal visits by the 12th week of pregnancy. They are usually scheduled monthly at first, then more often in the last 2 months before delivery. Keep your caregiver's appointments. Follow your caregiver's instructions regarding medicine use, blood and lab tests, exercise, and diet.  · During pregnancy, you are providing food for you and your baby. Eat regular, well-balanced  meals. Choose foods such as meat, fish, milk and other low fat dairy products, vegetables, fruits, and whole-grain breads and cereals. Your caregiver will tell you of the ideal weight gain.  · You can help morning sickness by keeping soda crackers at the bedside. Eat a couple before arising in the morning. You may want to use the crackers without salt on them.  · Eating 4 to 5 small meals rather than 3 large meals a day also may help the nausea and vomiting.  · Drinking liquids between meals instead of during meals also seems to help nausea and vomiting.  · A physical sexual relationship may be continued throughout pregnancy if there are no other problems. Problems may be early (premature) leaking of amniotic fluid from the membranes, vaginal bleeding, or belly (abdominal) pain.  · Exercise regularly if there are no restrictions. Check with your caregiver or physical therapist if you are unsure of the safety of some of your exercises. Greater weight gain will occur in the last 2 trimesters of pregnancy. Exercising will help:  · Control your weight.  · Keep you in shape.  · Prepare you for labor and delivery.  · Help you lose your pregnancy weight after you deliver your baby.  · Wear a good support or jogging bra for breast tenderness during pregnancy. This may help if worn during sleep too.  · Ask when prenatal classes are available. Begin classes when they are offered.  · Do not use hot tubs, steam rooms, or saunas.  · Wear your seat belt when driving. This protects you and your baby if you are in an accident.  · Avoid raw meat, uncooked cheese, cat litter boxes, and soil used by cats throughout the pregnancy. These carry germs that can cause birth defects in the baby.  · The first trimester is a good time to visit your dentist for your dental health. Getting your teeth cleaned is okay. Use a softer toothbrush and brush gently during pregnancy.  · Ask for help if you have financial, counseling, or nutritional needs  during pregnancy. Your caregiver will be able to offer counseling for these needs as well as refer you for other special needs.  · Do not take any medicines or herbs unless told by your caregiver.  · Inform your caregiver if there is any mental or physical domestic violence.  · Make a list of emergency phone numbers of family, friends, hospital, and police and fire departments.  · Write down your questions. Take them to your prenatal visit.  · Do not douche.  · Do not cross your legs.  · If you have to stand for long periods of time, rotate you feet or take small steps in a circle.  · You may have more vaginal secretions that may require a sanitary pad. Do not use tampons or scented sanitary pads.  MEDICINES AND DRUG USE IN PREGNANCY  ·   Take prenatal vitamins as directed. The vitamin should contain 1 milligram of folic acid. Keep all vitamins out of reach of children. Only a couple vitamins or tablets containing iron may be fatal to a baby or young child when ingested.  · Avoid use of all medicines, including herbs, over-the-counter medicines, not prescribed or suggested by your caregiver. Only take over-the-counter or prescription medicines for pain, discomfort, or fever as directed by your caregiver. Do not use aspirin, ibuprofen, or naproxen unless directed by your caregiver.  · Let your caregiver also know about herbs you may be using.  · Alcohol is related to a number of birth defects. This includes fetal alcohol syndrome. All alcohol, in any form, should be avoided completely. Smoking will cause low birth rate and premature babies.  · Street or illegal drugs are very harmful to the baby. They are absolutely forbidden. A baby born to an addicted mother will be addicted at birth. The baby will go through the same withdrawal an adult does.  · Let your caregiver know about any medicines that you have to take and for what reason you take them.  SEEK MEDICAL CARE IF:   You have any concerns or worries during your  pregnancy. It is better to call with your questions if you feel they cannot wait, rather than worry about them.  SEEK IMMEDIATE MEDICAL CARE IF:   · An unexplained oral temperature above 102° F (38.9° C) develops, or as your caregiver suggests.  · You have leaking of fluid from the vagina (birth canal). If leaking membranes are suspected, take your temperature and inform your caregiver of this when you call.  · There is vaginal spotting or bleeding. Notify your caregiver of the amount and how many pads are used.  · You develop a bad smelling vaginal discharge with a change in the color.  · You continue to feel sick to your stomach (nauseated) and have no relief from remedies suggested. You vomit blood or coffee ground-like materials.  · You lose more than 2 pounds of weight in 1 week.  · You gain more than 2 pounds of weight in 1 week and you notice swelling of your face, hands, feet, or legs.  · You gain 5 pounds or more in 1 week (even if you do not have swelling of your hands, face, legs, or feet).  · You get exposed to German measles and have never had them.  · You are exposed to fifth disease or chickenpox.  · You develop belly (abdominal) pain. Round ligament discomfort is a common non-cancerous (benign) cause of abdominal pain in pregnancy. Your caregiver still must evaluate this.  · You develop headache, fever, diarrhea, pain with urination, or shortness of breath.  · You fall or are in a car accident or have any kind of trauma.  · There is mental or physical violence in your home.  Document Released: 12/02/2001 Document Revised: 09/01/2012 Document Reviewed: 06/05/2009  Exit[employer]® Patient Information ©2014 [employer].

## 2014-04-03 ENCOUNTER — Ambulatory Visit (HOSPITAL_COMMUNITY): Payer: PRIVATE HEALTH INSURANCE

## 2014-04-05 ENCOUNTER — Ambulatory Visit (HOSPITAL_COMMUNITY)
Admission: RE | Admit: 2014-04-05 | Discharge: 2014-04-05 | Disposition: A | Discharge status: Home / Self Care | Payer: No Typology Code available for payment source | Source: Ambulatory Visit | Attending: Advanced Practice Midwife | Admitting: Advanced Practice Midwife

## 2014-04-05 ENCOUNTER — Other Ambulatory Visit (HOSPITAL_COMMUNITY): Payer: Self-pay | Admitting: Advanced Practice Midwife

## 2014-04-05 DIAGNOSIS — Z3201 Encounter for pregnancy test, result positive: Secondary | ICD-10-CM

## 2014-04-05 DIAGNOSIS — Z3689 Encounter for other specified antenatal screening: Secondary | ICD-10-CM | POA: Diagnosis present

## 2014-04-05 DIAGNOSIS — O208 Other hemorrhage in early pregnancy: Secondary | ICD-10-CM | POA: Insufficient documentation

## 2014-04-13 ENCOUNTER — Encounter: Payer: No Typology Code available for payment source | Admitting: Obstetrics

## 2014-05-01 ENCOUNTER — Encounter: Payer: Self-pay | Admitting: Obstetrics

## 2014-05-01 ENCOUNTER — Ambulatory Visit (INDEPENDENT_AMBULATORY_CARE_PROVIDER_SITE_OTHER): Payer: No Typology Code available for payment source | Admitting: Obstetrics

## 2014-05-01 VITALS — BP 112/81 | HR 78 | Wt 200.0 lb

## 2014-05-01 DIAGNOSIS — Z3480 Encounter for supervision of other normal pregnancy, unspecified trimester: Secondary | ICD-10-CM

## 2014-05-01 DIAGNOSIS — Z3201 Encounter for pregnancy test, result positive: Secondary | ICD-10-CM

## 2014-05-01 LAB — OB RESULTS CONSOLE GC/CHLAMYDIA
Chlamydia: NEGATIVE
Gonorrhea: NEGATIVE

## 2014-05-01 LAB — OB RESULTS CONSOLE GBS: GBS: NEGATIVE

## 2014-05-02 LAB — WET PREP BY MOLECULAR PROBE
Candida species: NEGATIVE
Gardnerella vaginalis: POSITIVE — AB
Trichomonas vaginosis: NEGATIVE

## 2014-05-02 LAB — HIV ANTIBODY (ROUTINE TESTING W REFLEX): HIV 1&2 Ab, 4th Generation: NONREACTIVE

## 2014-05-02 LAB — OBSTETRIC PANEL
Antibody Screen: NEGATIVE
BASOS ABS: 0 10*3/uL (ref 0.0–0.1)
BASOS PCT: 0 % (ref 0–1)
EOS ABS: 0.1 10*3/uL (ref 0.0–0.7)
EOS PCT: 1 % (ref 0–5)
HCT: 41 % (ref 36.0–46.0)
HEP B S AG: NEGATIVE
Hemoglobin: 13.7 g/dL (ref 12.0–15.0)
Lymphocytes Relative: 29 % (ref 12–46)
Lymphs Abs: 2.9 10*3/uL (ref 0.7–4.0)
MCH: 26.9 pg (ref 26.0–34.0)
MCHC: 33.4 g/dL (ref 30.0–36.0)
MCV: 80.6 fL (ref 78.0–100.0)
Monocytes Absolute: 0.6 10*3/uL (ref 0.1–1.0)
Monocytes Relative: 6 % (ref 3–12)
NEUTROS PCT: 64 % (ref 43–77)
Neutro Abs: 6.5 10*3/uL (ref 1.7–7.7)
PLATELETS: 248 10*3/uL (ref 150–400)
RBC: 5.09 MIL/uL (ref 3.87–5.11)
RDW: 13.7 % (ref 11.5–15.5)
Rh Type: POSITIVE
Rubella: 1.52 Index — ABNORMAL HIGH (ref <0.90)
WBC: 10.1 10*3/uL (ref 4.0–10.5)

## 2014-05-02 LAB — CULTURE, OB URINE
Colony Count: NO GROWTH
ORGANISM ID, BACTERIA: NO GROWTH

## 2014-05-02 LAB — VITAMIN D 25 HYDROXY (VIT D DEFICIENCY, FRACTURES): Vit D, 25-Hydroxy: 39 ng/mL (ref 30–89)

## 2014-05-02 LAB — PAP IG W/ RFLX HPV ASCU

## 2014-05-02 LAB — GC/CHLAMYDIA PROBE AMP
CT PROBE, AMP APTIMA: NEGATIVE
GC Probe RNA: NEGATIVE

## 2014-05-02 NOTE — Progress Notes (Signed)
  Subjective:    Ashley Casey is a 33 y.o. female being seen today for her obstetrical visit. She is at [redacted]w[redacted]d gestation. Patient reports: no complaints.  Problem List Items Addressed This Visit   None    Visit Diagnoses   Supervision of other normal pregnancy    -  Primary    Relevant Orders       Obstetric panel       HIV antibody       Hemoglobinopathy evaluation       Vit D  25 hydroxy (rtn osteoporosis monitoring)       Culture, OB Urine       WET PREP BY MOLECULAR PROBE       GC/Chlamydia Probe Amp       Pap IG w/ reflex to HPV when ASC-U      Patient Active Problem List   Diagnosis Date Noted  . 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 Symptomatic cholelithiasis 07/28/2011    Objective:     BP 112/81  Pulse 78  Wt 200 lb (90.719 kg)  LMP 02/17/2014 Uterine Size: Below umbilicus     Assessment:    Pregnancy @ [redacted]w[redacted]d  weeks Doing well    Plan:    Problem list reviewed and updated. Labs reviewed.  Follow up in 4 weeks. FIRST/CF mutation testing/NIPT/QUAD SCREEN/fragile X/Ashkenazi Jewish population testing/Spinal muscular atrophy discussed: requested. Role of ultrasound in pregnancy discussed; fetal survey: requested. Amniocentesis discussed: not indicated.

## 2014-05-03 LAB — HEMOGLOBINOPATHY EVALUATION
HGB A2 QUANT: 2.4 % (ref 2.2–3.2)
HGB A: 97.6 % (ref 96.8–97.8)
HGB S QUANTITAION: 0 %
Hemoglobin Other: 0 %
Hgb F Quant: 0 % (ref 0.0–2.0)

## 2014-05-11 ENCOUNTER — Emergency Department (HOSPITAL_COMMUNITY)
Admission: EM | Admit: 2014-05-11 | Discharge: 2014-05-11 | Disposition: A | Discharge status: Home / Self Care | Payer: Medicaid Other | Attending: Emergency Medicine | Admitting: Emergency Medicine

## 2014-05-11 ENCOUNTER — Encounter (HOSPITAL_COMMUNITY): Payer: Self-pay | Admitting: Emergency Medicine

## 2014-05-11 DIAGNOSIS — Z791 Long term (current) use of non-steroidal anti-inflammatories (NSAID): Secondary | ICD-10-CM | POA: Insufficient documentation

## 2014-05-11 DIAGNOSIS — K5900 Constipation, unspecified: Secondary | ICD-10-CM | POA: Insufficient documentation

## 2014-05-11 DIAGNOSIS — Z79899 Other long term (current) drug therapy: Secondary | ICD-10-CM | POA: Insufficient documentation

## 2014-05-11 DIAGNOSIS — O210 Mild hyperemesis gravidarum: Secondary | ICD-10-CM | POA: Insufficient documentation

## 2014-05-11 DIAGNOSIS — R42 Dizziness and giddiness: Secondary | ICD-10-CM | POA: Insufficient documentation

## 2014-05-11 DIAGNOSIS — Z9089 Acquired absence of other organs: Secondary | ICD-10-CM | POA: Insufficient documentation

## 2014-05-11 DIAGNOSIS — F411 Generalized anxiety disorder: Secondary | ICD-10-CM | POA: Insufficient documentation

## 2014-05-11 DIAGNOSIS — O99340 Other mental disorders complicating pregnancy, unspecified trimester: Secondary | ICD-10-CM | POA: Insufficient documentation

## 2014-05-11 DIAGNOSIS — R209 Unspecified disturbances of skin sensation: Secondary | ICD-10-CM | POA: Insufficient documentation

## 2014-05-11 DIAGNOSIS — Z8669 Personal history of other diseases of the nervous system and sense organs: Secondary | ICD-10-CM | POA: Insufficient documentation

## 2014-05-11 DIAGNOSIS — R1013 Epigastric pain: Secondary | ICD-10-CM | POA: Insufficient documentation

## 2014-05-11 DIAGNOSIS — Z9889 Other specified postprocedural states: Secondary | ICD-10-CM | POA: Insufficient documentation

## 2014-05-11 DIAGNOSIS — O9989 Other specified diseases and conditions complicating pregnancy, childbirth and the puerperium: Secondary | ICD-10-CM | POA: Insufficient documentation

## 2014-05-11 DIAGNOSIS — Z3202 Encounter for pregnancy test, result negative: Secondary | ICD-10-CM | POA: Insufficient documentation

## 2014-05-11 LAB — CBC WITH DIFFERENTIAL/PLATELET
BASOS ABS: 0 10*3/uL (ref 0.0–0.1)
BASOS PCT: 0 % (ref 0–1)
EOS ABS: 0.1 10*3/uL (ref 0.0–0.7)
Eosinophils Relative: 1 % (ref 0–5)
HEMATOCRIT: 38.7 % (ref 36.0–46.0)
Hemoglobin: 12.8 g/dL (ref 12.0–15.0)
Lymphocytes Relative: 24 % (ref 12–46)
Lymphs Abs: 2.8 10*3/uL (ref 0.7–4.0)
MCH: 26.6 pg (ref 26.0–34.0)
MCHC: 33.1 g/dL (ref 30.0–36.0)
MCV: 80.5 fL (ref 78.0–100.0)
MONO ABS: 0.7 10*3/uL (ref 0.1–1.0)
MONOS PCT: 6 % (ref 3–12)
NEUTROS PCT: 69 % (ref 43–77)
Neutro Abs: 8.1 10*3/uL — ABNORMAL HIGH (ref 1.7–7.7)
Platelets: 238 10*3/uL (ref 150–400)
RBC: 4.81 MIL/uL (ref 3.87–5.11)
RDW: 12.6 % (ref 11.5–15.5)
WBC: 11.7 10*3/uL — ABNORMAL HIGH (ref 4.0–10.5)

## 2014-05-11 LAB — URINALYSIS, ROUTINE W REFLEX MICROSCOPIC
Bilirubin Urine: NEGATIVE
Glucose, UA: NEGATIVE mg/dL
HGB URINE DIPSTICK: NEGATIVE
KETONES UR: NEGATIVE mg/dL
Leukocytes, UA: NEGATIVE
Nitrite: NEGATIVE
PROTEIN: NEGATIVE mg/dL
Specific Gravity, Urine: 1.012 (ref 1.005–1.030)
Urobilinogen, UA: 2 mg/dL — ABNORMAL HIGH (ref 0.0–1.0)
pH: 7.5 (ref 5.0–8.0)

## 2014-05-11 LAB — BASIC METABOLIC PANEL
BUN: 5 mg/dL — AB (ref 6–23)
CO2: 22 mEq/L (ref 19–32)
Calcium: 9.6 mg/dL (ref 8.4–10.5)
Chloride: 98 mEq/L (ref 96–112)
Creatinine, Ser: 0.49 mg/dL — ABNORMAL LOW (ref 0.50–1.10)
GFR calc Af Amer: >90 mL/min (ref >90)
Glucose, Bld: 144 mg/dL — ABNORMAL HIGH (ref 70–99)
Potassium: 3.5 mEq/L — ABNORMAL LOW (ref 3.7–5.3)
Sodium: 135 mEq/L — ABNORMAL LOW (ref 137–147)

## 2014-05-11 LAB — HEPATIC FUNCTION PANEL
ALT: 11 U/L (ref 0–35)
AST: 16 U/L (ref 0–37)
Albumin: 3.2 g/dL — ABNORMAL LOW (ref 3.5–5.2)
Alkaline Phosphatase: 87 U/L (ref 39–117)
Total Bilirubin: 0.3 mg/dL (ref 0.3–1.2)
Total Protein: 7.5 g/dL (ref 6.0–8.3)

## 2014-05-11 LAB — LIPASE, BLOOD: Lipase: 17 U/L (ref 11–59)

## 2014-05-11 LAB — POC URINE PREG, ED: Preg Test, Ur: POSITIVE — AB

## 2014-05-11 MED ORDER — FAMOTIDINE 20 MG PO TABS: 20.0000 mg | ORAL_TABLET | Freq: Two times a day (BID) | ORAL | Status: DC

## 2014-05-11 MED ORDER — FAMOTIDINE 20 MG PO TABS
20.0000 mg | ORAL_TABLET | Freq: Once | ORAL | Status: AC
  Administered 2014-05-11: 20 mg via ORAL
  Filled 2014-05-11: qty 1

## 2014-05-11 MED ORDER — SODIUM CHLORIDE 0.9 % IV BOLUS (SEPSIS): 1000.0000 mL | Freq: Once | INTRAVENOUS | Status: DC

## 2014-05-11 NOTE — Discharge Instructions (Signed)
Abdominal Pain During Pregnancy °Abdominal pain is common in pregnancy. Most of the time, it does not cause harm. There are many causes of abdominal pain. Some causes are more serious than others. Some of the causes of abdominal pain in pregnancy are easily diagnosed. Occasionally, the diagnosis takes time to understand. Other times, the cause is not determined. Abdominal pain can be a sign that something is very wrong with the pregnancy, or the pain may have nothing to do with the pregnancy at all. For this reason, always tell your health care provider if you have any abdominal discomfort. °HOME CARE INSTRUCTIONS  °Monitor your abdominal pain for any changes. The following actions may help to alleviate any discomfort you are experiencing: °· Do not have sexual intercourse or put anything in your vagina until your symptoms go away completely. °· Get plenty of rest until your pain improves. °· Drink clear fluids if you feel nauseous. Avoid solid food as long as you are uncomfortable or nauseous. °· Only take over-the-counter or prescription medicine as directed by your health care provider. °· Keep all follow-up appointments with your health care provider. °SEEK IMMEDIATE MEDICAL CARE IF: °· You are bleeding, leaking fluid, or passing tissue from the vagina. °· You have increasing pain or cramping. °· You have persistent vomiting. °· You have painful or bloody urination. °· You have a fever. °· You notice a decrease in your baby's movements. °· You have extreme weakness or feel faint. °· You have shortness of breath, with or without abdominal pain. °· You develop a severe headache with abdominal pain. °· You have abnormal vaginal discharge with abdominal pain. °· You have persistent diarrhea. °· You have abdominal pain that continues even after rest, or gets worse. °MAKE SURE YOU:  °· Understand these instructions. °· Will watch your condition. °· Will get help right away if you are not doing well or get  worse. °Document Released: 12/08/2005 Document Revised: 09/28/2013 Document Reviewed: 07/07/2013 °ExitCare® Patient Information ©2014 [employer]. ° °

## 2014-05-11 NOTE — ED Notes (Signed)
Pt presents with c/o abdominal pain, nausea, and vomiting. Pt says the symptoms started earlier today around 3:00pm, pt is also [redacted] weeks pregnant. Pt says the abdominal pain is in the upper region of her abdomen and she has vomited approx 4 times since 3 pm. Pt also c/o tingling in her feet.

## 2014-05-11 NOTE — ED Provider Notes (Signed)
Medical screening examination/treatment/procedure(s) were conducted as a shared visit with non-physician practitioner(s) and myself.  I personally evaluated the patient during the encounter. Alert in no distress TTP epigastrum without rebound or guarding.  Pt tolerating po fluids.  Doubt bowel obstruction. Already has had her gallbladder removed Doubt pregnancy related complication.  No lower abdominal pain.  No vaginal bleeding.  Will dc home with antacids  Dorie Rank, MD 05/11/14 2248

## 2014-05-11 NOTE — ED Provider Notes (Signed)
CSN: [id number]     Arrival date & time 05/11/14  1751 History   First MD Initiated Contact with Patient 05/11/14 1827     Chief Complaint  Patient presents with  . Abdominal Pain  . Emesis    HPI Comments: 33 y.o. Female who is [redacted] weeks pregnant presents to the ED for 4 hours of aching epigastric pain.  Patient reports that it is associated with nausea and 4 episodes of vomiting. It was slightly relieved by antacids.  She has since been able to keep down small sips of water.  She denies any sick contacts at this time.  She established OB care two weeks ago and everything was normal at that appointment.  She denies any contractions, leakage of vaginal fluid, bleeding per vagina, or urinary symptoms.  Patient is a 33 y.o. female presenting with abdominal pain and vomiting. The history is provided by the patient. No language interpreter was used.  Abdominal Pain Pain location:  Epigastric Pain quality: aching   Pain radiates to:  Does not radiate Pain severity:  Severe Onset quality:  Sudden Duration:  4 hours Timing:  Constant Progression:  Unchanged Chronicity:  New Context: not diet changes, not eating, not recent illness, not sick contacts, not suspicious food intake and not trauma   Relieved by:  Antacids Worsened by:  Nothing tried Ineffective treatments:  Belching, eating and liquids Associated symptoms: constipation, nausea and vomiting   Associated symptoms: no anorexia, no chest pain, no chills, no diarrhea, no dysuria, no fatigue, no fever, no hematemesis, no hematochezia, no hematuria, no melena, no shortness of breath, no sore throat, no vaginal bleeding and no vaginal discharge   Risk factors: pregnancy   Emesis Associated symptoms: abdominal pain   Associated symptoms: no chills, no diarrhea and no sore throat     Past Medical History  Diagnosis Date  . Fatigue   . IBS (irritable bowel syndrome)   . Cholelithiasis   . Anxiety   . Neuromuscular disorder   .  Abnormal Pap smear    Past Surgical History  Procedure Laterality Date  . 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 Cholecystectomy  8/29/121  . Therapeutic abortion    . Wisdom tooth extraction     Family History  Problem Relation Age of Onset  . Other Mother     sickle cell anemia  . Arthritis Brother   . Asthma Brother    History  Substance Use Topics  . Smoking status: Never Smoker   . Smokeless tobacco: Never Used  . Alcohol Use: No   OB History   Grav Para Term Preterm Abortions TAB SAB Ect Mult Living   8 4 4  3 1 2   4      Review of Systems  Constitutional: Negative for fever, chills and fatigue.  HENT: Negative for sore throat.   Respiratory: Negative for chest tightness and shortness of breath.   Cardiovascular: Negative for chest pain and palpitations.  Gastrointestinal: Positive for nausea, vomiting, abdominal pain and constipation. Negative for diarrhea, blood in stool, melena, hematochezia, abdominal distention, anal bleeding, rectal pain, anorexia and hematemesis.  Genitourinary: Negative for dysuria, urgency, hematuria, flank pain, decreased urine volume, vaginal bleeding, vaginal discharge, difficulty urinating and pelvic pain.  Musculoskeletal: Negative for back pain.  Neurological: Positive for dizziness and light-headedness.       Tingling in legs  All other systems reviewed and are negative.     Allergies  Orange juice; Tegretol; and  Grapefruit concentrate  Home Medications   Prior to Admission medications   Medication Sig Start Date End Date Taking? Authorizing Provider  Ascorbic Acid (VITAMIN C) 500 MG CAPS Take by mouth.    Historical Provider, MD  carisoprodol (SOMA) 350 MG tablet Take 350 mg by mouth 3 (three) times daily as needed.     Historical Provider, MD  cholecalciferol (VITAMIN D) 1000 UNITS tablet Take 1 tablet (1,000 Units total) by mouth daily. 10/21/13   Shelly Bombard, MD  clonazePAM (KLONOPIN) 1 MG tablet Take 2 mg by mouth  2 (two) times daily as needed (patient using prn in evening).     Historical Provider, MD  ibuprofen (ADVIL,MOTRIN) 800 MG tablet TAKE ONE TABLET BY MOUTH EVERY 8 HOURS AS NEEDED 12/09/13   Manya Silvas, CNM  naproxen (NAPROSYN) 500 MG tablet Take 500 mg by mouth 2 (two) times daily with a meal.    Historical Provider, MD  Prenatal Multivit-Min-Fe-FA (PRE-NATAL PO) Take by mouth daily.     Historical Provider, MD  promethazine (PHENERGAN) 25 MG tablet Take 1 tablet (25 mg total) by mouth every 6 (six) hours as needed for nausea or vomiting. 03/24/14   Heather Erby Pian, CNM   BP 128/78  Pulse 84  Temp(Src) 98.5 F (36.9 C) (Oral)  Resp 22  SpO2 100%  LMP 02/17/2014 Physical Exam  Nursing note and vitals reviewed. Constitutional: She is oriented to person, place, and time. She appears well-developed and well-nourished. No distress.  HENT:  Head: Normocephalic and atraumatic.  Mouth/Throat: Mucous membranes are dry.  Eyes: Conjunctivae are normal. Pupils are equal, round, and reactive to light. No scleral icterus.  Neck: Normal range of motion. Neck supple. No JVD present.  Cardiovascular: Normal rate, regular rhythm, normal heart sounds and intact distal pulses.  Exam reveals no gallop and no friction rub.   No murmur heard. Pulmonary/Chest: Effort normal and breath sounds normal. No respiratory distress. She has no wheezes. She has no rales. She exhibits no tenderness.  Abdominal: Soft. Bowel sounds are normal. She exhibits no distension. There is no hepatosplenomegaly, splenomegaly or hepatomegaly. There is tenderness in the epigastric area and suprapubic area. There is CVA tenderness. There is no rigidity, no rebound, no guarding, no tenderness at McBurney's point and negative Murphy's sign.  Musculoskeletal: Normal range of motion.  Lymphadenopathy:    She has no cervical adenopathy.  Neurological: She is alert and oriented to person, place, and time. No cranial nerve deficit.   Skin: Skin is warm and dry. She is not diaphoretic.  Psychiatric: She has a normal mood and affect. Her behavior is normal. Judgment and thought content normal.    ED Course  Procedures (including critical care time) Labs Review Labs Reviewed  CBC WITH DIFFERENTIAL - Abnormal; Notable for the following:    WBC 11.7 (*)    Neutro Abs 8.1 (*)    All other components within normal limits  BASIC METABOLIC PANEL - Abnormal; Notable for the following:    Sodium 135 (*)    Potassium 3.5 (*)    Glucose, Bld 144 (*)    BUN 5 (*)    Creatinine, Ser 0.49 (*)    All other components within normal limits  URINALYSIS, ROUTINE W REFLEX MICROSCOPIC - Abnormal; Notable for the following:    Urobilinogen, UA 2.0 (*)    All other components within normal limits  POC URINE PREG, ED - Abnormal; Notable for the following:    Preg Test, Ur POSITIVE (*)  All other components within normal limits  LIPASE, BLOOD  HEPATIC FUNCTION PANEL    Imaging Review No results found.   EKG Interpretation None      Fetal Heart Tones found in the left lower quadrant and were regular and in the 140s.  MDM   Final diagnoses:  None    Patient presented with Epigastric pain associated with nausea and vomiting x 3 hours.  Physical exam reveals tenderness to the epigastric area on palpation.  Differential includes PUD, GERD, Gastritis, and gravidarum emesis.  Patient was given Pepcid and a fluid challenge, and stated that she felt better and was hungry.  She was able to keep down crackers in the ED.  Patient was given a prescription of Pepcid for coverage of GERD, PUD, and Gastritis and was instructed to follow-up with her OB/Gyn in 3-5 days or if symptoms worsened.  Warning signs discussed with the patient included coffee ground emesis, hematemesis, and melena.       Kenard Gower, PA-C 05/11/14 2237

## 2014-05-16 ENCOUNTER — Other Ambulatory Visit: Payer: Self-pay | Admitting: *Deleted

## 2014-05-16 DIAGNOSIS — N760 Acute vaginitis: Principal | ICD-10-CM

## 2014-05-16 DIAGNOSIS — B9689 Other specified bacterial agents as the cause of diseases classified elsewhere: Secondary | ICD-10-CM

## 2014-05-16 MED ORDER — METRONIDAZOLE 500 MG PO TABS: 500.0000 mg | ORAL_TABLET | Freq: Two times a day (BID) | ORAL | Status: DC

## 2014-05-18 NOTE — ED Provider Notes (Signed)
Medical screening examination/treatment/procedure(s) were performed by non-physician practitioner and as supervising physician I was immediately available for consultation/collaboration.     Dorie Rank, MD 05/18/14 1115

## 2014-05-29 ENCOUNTER — Ambulatory Visit (INDEPENDENT_AMBULATORY_CARE_PROVIDER_SITE_OTHER): Payer: No Typology Code available for payment source | Admitting: Obstetrics

## 2014-05-29 VITALS — BP 103/75 | HR 98 | Temp 97.8°F | Wt 205.0 lb

## 2014-05-29 DIAGNOSIS — Z3480 Encounter for supervision of other normal pregnancy, unspecified trimester: Secondary | ICD-10-CM

## 2014-05-29 LAB — POCT URINALYSIS DIPSTICK
BILIRUBIN UA: NEGATIVE
Glucose, UA: 1000
Ketones, UA: NEGATIVE
Leukocytes, UA: NEGATIVE
NITRITE UA: NEGATIVE
Protein, UA: NEGATIVE
RBC UA: NEGATIVE
Spec Grav, UA: 1.02
Urobilinogen, UA: NEGATIVE
pH, UA: 5

## 2014-05-30 ENCOUNTER — Encounter: Payer: Self-pay | Admitting: Obstetrics

## 2014-05-30 NOTE — Progress Notes (Signed)
  Subjective:    Ashley Casey is a 33 y.o. female being seen today for her obstetrical visit. She is at [redacted]w[redacted]d gestation. Patient reports: no complaints.  Problem List Items Addressed This Visit   None    Visit Diagnoses   Supervision of other normal pregnancy    -  Primary    Relevant Orders       POCT urinalysis dipstick (Completed)      Patient Active Problem List   Diagnosis Date Noted  . 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 Symptomatic cholelithiasis 07/28/2011    Objective:     BP 103/75  Pulse 98  Temp(Src) 97.8 F (36.6 C)  Wt 205 lb (92.987 kg)  LMP 02/17/2014 Uterine Size: Below umbilicus     Assessment:    Pregnancy @ [redacted]w[redacted]d  weeks Doing well    Plan:    Problem list reviewed and updated. Labs reviewed.  Follow up in 2 weeks. FIRST/CF mutation testing/NIPT/QUAD SCREEN/fragile X/Ashkenazi Jewish population testing/Spinal muscular atrophy discussed: requested. Role of ultrasound in pregnancy discussed; fetal survey: requested. Amniocentesis discussed: not indicated.

## 2014-06-12 ENCOUNTER — Ambulatory Visit (INDEPENDENT_AMBULATORY_CARE_PROVIDER_SITE_OTHER): Payer: No Typology Code available for payment source | Admitting: Obstetrics

## 2014-06-12 VITALS — BP 122/85 | HR 105 | Temp 97.2°F | Wt 205.0 lb

## 2014-06-12 DIAGNOSIS — Z363 Encounter for antenatal screening for malformations: Secondary | ICD-10-CM

## 2014-06-12 DIAGNOSIS — Z1389 Encounter for screening for other disorder: Secondary | ICD-10-CM

## 2014-06-12 DIAGNOSIS — Z3482 Encounter for supervision of other normal pregnancy, second trimester: Secondary | ICD-10-CM

## 2014-06-12 DIAGNOSIS — Z3480 Encounter for supervision of other normal pregnancy, unspecified trimester: Secondary | ICD-10-CM

## 2014-06-12 NOTE — Progress Notes (Signed)
  Subjective:    Ashley Casey is a 33 y.o. female being seen today for her obstetrical visit. She is at [redacted]w[redacted]d gestation. Patient reports: no complaints.  Problem List Items Addressed This Visit   None    Visit Diagnoses   Encounter for supervision of other normal pregnancy in second trimester    -  Primary    Relevant Orders       POCT urinalysis dipstick       AFP, Quad Screen    Encounter for routine screening for malformation using ultrasonics        Relevant Orders       US OB Comp + 14 Wk      Patient Active Problem List   Diagnosis Date Noted  . 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 Symptomatic cholelithiasis 07/28/2011    Objective:     BP 122/85  Pulse 105  Temp(Src) 97.2 F (36.2 C)  Wt 205 lb (92.987 kg)  LMP 02/17/2014 Uterine Size: Below umbilicus     Assessment:    Pregnancy @ [redacted]w[redacted]d  weeks Doing well    Plan:    Problem list reviewed and updated. Labs reviewed.  Follow up in 4 weeks. FIRST/CF mutation testing/NIPT/QUAD SCREEN/fragile X/Ashkenazi Jewish population testing/Spinal muscular atrophy discussed: requested. Role of ultrasound in pregnancy discussed; fetal survey: requested. Amniocentesis discussed: not indicated.

## 2014-06-13 LAB — AFP, QUAD SCREEN
AFP: 24 IU/mL
Curr Gest Age: 16 wks.days
Down Syndrome Scr Risk Est: 1:1940 {titer}
HCG, Total: 26943 m[IU]/mL
INH: 154.5 pg/mL
Interpretation-AFP: NEGATIVE
MOM FOR AFP: 0.76
MOM FOR HCG: 1.1
MoM for INH: 0.89
Open Spina bifida: NEGATIVE
Osb Risk: <1:54600 {titer}
TRI 18 SCR RISK EST: NEGATIVE
uE3 Mom: 1.17
uE3 Value: 0.5 ng/mL

## 2014-07-10 ENCOUNTER — Encounter: Payer: No Typology Code available for payment source | Admitting: Obstetrics

## 2014-07-11 ENCOUNTER — Encounter: Payer: No Typology Code available for payment source | Admitting: Obstetrics

## 2014-07-11 ENCOUNTER — Other Ambulatory Visit: Payer: No Typology Code available for payment source

## 2014-07-12 ENCOUNTER — Other Ambulatory Visit: Payer: No Typology Code available for payment source

## 2014-07-12 ENCOUNTER — Encounter: Payer: No Typology Code available for payment source | Admitting: Obstetrics

## 2014-07-13 ENCOUNTER — Encounter: Payer: No Typology Code available for payment source | Admitting: Obstetrics

## 2014-07-17 ENCOUNTER — Ambulatory Visit (HOSPITAL_COMMUNITY): Payer: Medicaid Other

## 2014-07-18 ENCOUNTER — Encounter: Payer: No Typology Code available for payment source | Admitting: Obstetrics

## 2014-09-06 ENCOUNTER — Encounter: Payer: Medicaid Other | Attending: Obstetrics and Gynecology

## 2014-09-06 DIAGNOSIS — Z713 Dietary counseling and surveillance: Secondary | ICD-10-CM | POA: Diagnosis not present

## 2014-09-06 DIAGNOSIS — O99810 Abnormal glucose complicating pregnancy: Secondary | ICD-10-CM | POA: Insufficient documentation

## 2014-09-10 NOTE — Progress Notes (Signed)
  Patient was seen on 09/06/14 for Gestational Diabetes self-management . The following learning objectives were met by the patient :   States the definition of Gestational Diabetes  States why dietary management is important in controlling blood glucose  Describes the effects of carbohydrates on blood glucose levels  Demonstrates ability to create a balanced meal plan  Demonstrates carbohydrate counting   States when to check blood glucose levels  Demonstrates proper blood glucose monitoring techniques  States the effect of stress and exercise on blood glucose levels  States the importance of limiting caffeine and abstaining from alcohol and smoking  Plan:  Aim for 2 Carb Choices per meal (30 grams) +/- 1 either way for breakfast Aim for 3 Carb Choices per meal (45 grams) +/- 1 either way from lunch and dinner Aim for 1-2 Carbs per snack Begin reading food labels for Total Carbohydrate and sugar grams of foods Consider  increasing your activity level by walking daily as tolerated Begin checking BG before breakfast and 2 hours after first bit of breakfast, lunch and dinner after  as directed by MD  Take medication  as directed by MD  Blood glucose monitor given: Accu [employer]k Nano BG Monitoring Kit Lot # [id number] Exp:08/22/15 Blood glucose reading: 104  Patient instructed to monitor glucose levels: FBS: 60 - <90 1 hour: <140 2 hour: <120  Patient received the following handouts:  Nutrition Diabetes and Pregnancy  Carbohydrate Counting List  Meal Planning worksheet  Patient will be seen for follow-up as needed.

## 2014-11-08 ENCOUNTER — Inpatient Hospital Stay (HOSPITAL_COMMUNITY): Payer: Medicaid Other

## 2014-11-08 ENCOUNTER — Inpatient Hospital Stay (HOSPITAL_COMMUNITY)
Admission: AD | Admit: 2014-11-08 | Discharge: 2014-11-08 | Disposition: A | Discharge status: Home / Self Care | Payer: Medicaid Other | Source: Ambulatory Visit | Attending: Obstetrics and Gynecology | Admitting: Obstetrics and Gynecology

## 2014-11-08 ENCOUNTER — Encounter (HOSPITAL_COMMUNITY): Payer: Self-pay | Admitting: *Deleted

## 2014-11-08 DIAGNOSIS — Z3A37 37 weeks gestation of pregnancy: Secondary | ICD-10-CM | POA: Diagnosis not present

## 2014-11-08 DIAGNOSIS — O368131 Decreased fetal movements, third trimester, fetus 1: Secondary | ICD-10-CM

## 2014-11-08 DIAGNOSIS — O368190 Decreased fetal movements, unspecified trimester, not applicable or unspecified: Secondary | ICD-10-CM | POA: Insufficient documentation

## 2014-11-08 DIAGNOSIS — O368130 Decreased fetal movements, third trimester, not applicable or unspecified: Secondary | ICD-10-CM

## 2014-11-08 NOTE — [facility] Provider Note (Signed)
Subjective:  [name] is a 33 y.o. female [phone] at [redacted]w[redacted]d who presents with concerns regarding fetal movement. She was seen in the dr office yesterday and had her cervix checked, since then she has had some brown/ bloody discharge. She denies pain. Since she has been to [facility]U she has been feeling the baby move.   Objective:  GENERAL: Well-developed, well-nourished female in no acute distress.  HEENT: Normocephalic, atraumatic.   LUNGS: Effort normal SKIN: Warm, dry and without erythema PSYCH: Normal mood and affect  Dilation: 1 Effacement (%): 50 Cervical Position: Posterior Station: Ballotable Presentation: Vertex Exam by:: Noni Saupe NP  Filed Vitals:   11/08/14 1759  BP: 98/62  Pulse: 101  Temp:   Resp: 18    Fetal Tracing: Baseline: 145 bpm  Variability: moderate  Accelerations: 15x15 Decelerations: none Toco: none Quick variables in the first 20 minutes of fetal tracing with minimal variability. BPP ordered     MDM: [place] for BPP BPP 8/8- preliminary resport Reactive NST Discussed patient with Dr. Marvel Plan    Assessment:  1. Decreased fetal movement   2      Reactive NST 3.      BPP 8/8  Plan:  Discharge home in stable condition Kick counts Labor precautions Return to [facility] if symptoms worsen Follow up with PCP as scheduled   Darrelyn Hillock Rasch, NP 11/08/2014 8:06 PM

## 2014-11-08 NOTE — Discharge Instructions (Signed)
Fetal Movement Counts °Patient Name: __________________________________________________ Patient Due Date: ____________________ °Performing a fetal movement count is highly recommended in high-risk pregnancies, but it is good for every pregnant woman to do. Your health care provider may ask you to start counting fetal movements at 28 weeks of the pregnancy. Fetal movements often increase: °· After eating a full meal. °· After physical activity. °· After eating or drinking something sweet or cold. °· At rest. °Pay attention to when you feel the baby is most active. This will help you notice a pattern of your baby's sleep and wake cycles and what factors contribute to an increase in fetal movement. It is important to perform a fetal movement count at the same time each day when your baby is normally most active.  °HOW TO COUNT FETAL MOVEMENTS °· Find a quiet and comfortable area to sit or lie down on your left side. Lying on your left side provides the best blood and oxygen circulation to your baby. °· Write down the day and time on a sheet of paper or in a journal. °· Start counting kicks, flutters, swishes, rolls, or jabs in a 2-hour period. You should feel at least 10 movements within 2 hours. °· If you do not feel 10 movements in 2 hours, wait 2-3 hours and count again. Look for a change in the pattern or not enough counts in 2 hours. °SEEK MEDICAL CARE IF: °· You feel less than 10 counts in 2 hours, tried twice. °· There is no movement in over an hour. °· The pattern is changing or taking longer each day to reach 10 counts in 2 hours. °· You feel the baby is not moving as he or she usually does. °Date: ____________ Movements: ____________ Start time: ____________ Finish time: ____________  °Date: ____________ Movements: ____________ Start time: ____________ Finish time: ____________ °Date: ____________ Movements: ____________ Start time: ____________ Finish time: ____________ °Date: ____________ Movements:  ____________ Start time: ____________ Finish time: ____________ °Date: ____________ Movements: ____________ Start time: ____________ Finish time: ____________ °Date: ____________ Movements: ____________ Start time: ____________ Finish time: ____________ °Date: ____________ Movements: ____________ Start time: ____________ Finish time: ____________ °Date: ____________ Movements: ____________ Start time: ____________ Finish time: ____________  °Date: ____________ Movements: ____________ Start time: ____________ Finish time: ____________ °Date: ____________ Movements: ____________ Start time: ____________ Finish time: ____________ °Date: ____________ Movements: ____________ Start time: ____________ Finish time: ____________ °Date: ____________ Movements: ____________ Start time: ____________ Finish time: ____________ °Date: ____________ Movements: ____________ Start time: ____________ Finish time: ____________ °Date: ____________ Movements: ____________ Start time: ____________ Finish time: ____________ °Date: ____________ Movements: ____________ Start time: ____________ Finish time: ____________  °Date: ____________ Movements: ____________ Start time: ____________ Finish time: ____________ °Date: ____________ Movements: ____________ Start time: ____________ Finish time: ____________ °Date: ____________ Movements: ____________ Start time: ____________ Finish time: ____________ °Date: ____________ Movements: ____________ Start time: ____________ Finish time: ____________ °Date: ____________ Movements: ____________ Start time: ____________ Finish time: ____________ °Date: ____________ Movements: ____________ Start time: ____________ Finish time: ____________ °Date: ____________ Movements: ____________ Start time: ____________ Finish time: ____________  °Date: ____________ Movements: ____________ Start time: ____________ Finish time: ____________ °Date: ____________ Movements: ____________ Start time: ____________ Finish  time: ____________ °Date: ____________ Movements: ____________ Start time: ____________ Finish time: ____________ °Date: ____________ Movements: ____________ Start time: ____________ Finish time: ____________ °Date: ____________ Movements: ____________ Start time: ____________ Finish time: ____________ °Date: ____________ Movements: ____________ Start time: ____________ Finish time: ____________ °Date: ____________ Movements: ____________ Start time: ____________ Finish time: ____________  °Date: ____________ Movements: ____________ Start time: ____________ Finish   time: ____________ Date: ____________ Movements: ____________ Start time: ____________ Ashley Casey time: ____________ Date: ____________ Movements: ____________ Start time: ____________ Ashley Casey time: ____________ Date: ____________ Movements: ____________ Start time: ____________ Ashley Casey time: ____________ Date: ____________ Movements: ____________ Start time: ____________ Ashley Casey time: ____________ Date: ____________ Movements: ____________ Start time: ____________ Ashley Casey time: ____________ Date: ____________ Movements: ____________ Start time: ____________ Ashley Casey time: ____________  Date: ____________ Movements: ____________ Start time: ____________ Ashley Casey time: ____________ Date: ____________ Movements: ____________ Start time: ____________ Ashley Casey time: ____________ Date: ____________ Movements: ____________ Start time: ____________ Ashley Casey time: ____________ Date: ____________ Movements: ____________ Start time: ____________ Ashley Casey time: ____________ Date: ____________ Movements: ____________ Start time: ____________ Ashley Casey time: ____________ Date: ____________ Movements: ____________ Start time: ____________ Ashley Casey time: ____________ Date: ____________ Movements: ____________ Start time: ____________ Ashley Casey time: ____________  Date: ____________ Movements: ____________ Start time: ____________ Ashley Casey time: ____________ Date: ____________  Movements: ____________ Start time: ____________ Ashley Casey time: ____________ Date: ____________ Movements: ____________ Start time: ____________ Ashley Casey time: ____________ Date: ____________ Movements: ____________ Start time: ____________ Ashley Casey time: ____________ Date: ____________ Movements: ____________ Start time: ____________ Ashley Casey time: ____________ Date: ____________ Movements: ____________ Start time: ____________ Ashley Casey time: ____________ Date: ____________ Movements: ____________ Start time: ____________ Ashley Casey time: ____________  Date: ____________ Movements: ____________ Start time: ____________ Ashley Casey time: ____________ Date: ____________ Movements: ____________ Start time: ____________ Ashley Casey time: ____________ Date: ____________ Movements: ____________ Start time: ____________ Ashley Casey time: ____________ Date: ____________ Movements: ____________ Start time: ____________ Ashley Casey time: ____________ Date: ____________ Movements: ____________ Start time: ____________ Ashley Casey time: ____________ Date: ____________ Movements: ____________ Start time: ____________ Ashley Casey time: ____________ Document Released: 01/07/2007 Document Revised: 04/24/2014 Document Reviewed: 10/04/2012 [employer] Patient Information 2015 [employer]. This information is not intended to replace advice given to you by your health care provider. Make sure you discuss any questions you have with your health care provider.  Fetal Biophysical Profile This is a test that measures five different variables of the fetus: Heart rate, breathing movement, total movement of the baby, fetal muscle tone, the amount of amniotic fluid, and the heart rate activity of the fetus. The five variables are measured individually and contribute either a 2 or a 0 to the overall scoring of the test. The measurements are as follows:  Fetal heart rate activity. This is measured and scored in the same way as a non-stress test. The fetal heart rate is  considered reactive when there are movement-associated fetal heart rate increases of at least 15 beats per minute above baseline, and 15 seconds in duration over a 20-minute period. A score of 2 is given for reactivity, and a score of 0 indicates that the fetal heart rate is non-reactive.  Fetal breathing movements. This is scored based on fetal breathing movements and indicate fetal well-being. Their absence may indicate a low oxygen level for the fetus. Fetal breathing increases in frequency and uniformity after the 36th week of pregnancy. To earn a score of 2, the fetus must have at least one episode of fetal breathing lasting at least 60 seconds within a 30-minute observation. Absence of this breathing is scored a 0 on the BPP.  Fetal body movements. Fetal activity is a reflection of brain integrity and function. The presence of at least three episodes of fetal movements within a 30-minute period is given a score of 2. A score of 0 is given with two or less movements in this time period. Fetal activity is highest 1 to 3 hours after the mother has eaten a meal.  Fetal tone. In the uterus, the fetus is  normally in a position of flexion. This means the head is bent down towards the knees. The fetus also stretches, rolls, and moves in the uterus. The arms, legs, trunk, and head may be flexed and extended. A score of 2 is earned when there is at least one episode of active extension with return flexion. A score of 0 is given for slow extension with a return to only partial flexion. Fetal movement not followed by return to flexion, limbs or spine in extension, and an open fetal hand score 0.  Amniotic fluid volume. Amniotic fluid volume has been demonstrated to be a good method of predicting fetal distress. Too little amniotic fluid has been associated with fetal abnormalities, slow uterine growth, and over due pregnancy. A score of 2 is given for this when there is at least one pocket of amniotic fluid that  measures 1 cm in a specific area. A score of 0 indicates either that fluid is absent in most areas of the uterine cavity or that the largest pocket of fluid measures less than 1 cm. PREPARATION FOR TEST No preparation or fasting is necessary. NORMAL FINDINGS  A score of 8-10 points (if amniotic fluid volume is adequate).  Possible critical values: Less than 4 may necessitate immediate delivery of fetus. Ranges for normal findings may vary among different laboratories and hospitals. You should always check with your doctor after having lab work or other tests done to discuss the meaning of your test results and whether your values are considered within normal limits. MEANING OF TEST  Your caregiver will go over the test results with you and discuss the importance and meaning of your results, as well as treatment options and the need for additional tests if necessary. OBTAINING THE TEST RESULTS  It is your responsibility to obtain your test results. Ask the lab or department performing the test when and how you will get your results. Document Released: 04/10/2005 Document Revised: 03/01/2012 Document Reviewed: 11/17/2008 [facility] Patient Information 2015 [place], [place]. This information is not intended to replace advice given to you by your health care provider. Make sure you discuss any questions you have with your health care provider.

## 2014-11-08 NOTE — [facility] Provider Note (Signed)
NST reviewed and because initially not reactive was sent for  BPP  8/8 NST now reactive prior to d/c

## 2014-11-08 NOTE — [facility] Note (Signed)
Baby is not moving as much. No pains. No bleeding or leaking. D/c has changed- reddish brown noted today.  Sugar has also been high

## 2014-11-08 NOTE — [facility] Note (Signed)
C/o decreased fetal movement since this afternoon;

## 2014-11-09 DIAGNOSIS — O368190 Decreased fetal movements, unspecified trimester, not applicable or unspecified: Secondary | ICD-10-CM | POA: Insufficient documentation

## 2014-11-09 DIAGNOSIS — Z3A37 37 weeks gestation of pregnancy: Secondary | ICD-10-CM | POA: Insufficient documentation

## 2014-11-14 ENCOUNTER — Other Ambulatory Visit (HOSPITAL_COMMUNITY): Payer: Self-pay | Admitting: Obstetrics and Gynecology

## 2014-11-15 ENCOUNTER — Encounter (HOSPITAL_COMMUNITY): Payer: Self-pay | Admitting: *Deleted

## 2014-11-15 ENCOUNTER — Inpatient Hospital Stay (HOSPITAL_COMMUNITY)
Admission: AD | Admit: 2014-11-15 | Discharge: 2014-11-15 | Disposition: A | Discharge status: Home / Self Care | Payer: Medicaid Other | Source: Ambulatory Visit | Attending: Obstetrics and Gynecology | Admitting: Obstetrics and Gynecology

## 2014-11-15 DIAGNOSIS — Z3A38 38 weeks gestation of pregnancy: Secondary | ICD-10-CM | POA: Diagnosis not present

## 2014-11-15 DIAGNOSIS — O471 False labor at or after 37 completed weeks of gestation: Secondary | ICD-10-CM | POA: Insufficient documentation

## 2014-11-15 NOTE — Discharge Instructions (Signed)
Third Trimester of Pregnancy The third trimester is from week 29 through week 42, months 7 through 9. The third trimester is a time when the fetus is growing rapidly. At the end of the ninth month, the fetus is about 20 inches in length and weighs 6-10 pounds.  BODY CHANGES Your body goes through many changes during pregnancy. The changes vary from woman to woman.   Your weight will continue to increase. You can expect to gain 25-35 pounds (11-16 kg) by the end of the pregnancy.  You may begin to get stretch marks on your hips, abdomen, and breasts.  You may urinate more often because the fetus is moving lower into your pelvis and pressing on your bladder.  You may develop or continue to have heartburn as a result of your pregnancy.  You may develop constipation because certain hormones are causing the muscles that push waste through your intestines to slow down.  You may develop hemorrhoids or swollen, bulging veins (varicose veins).  You may have pelvic pain because of the weight gain and pregnancy hormones relaxing your joints between the bones in your pelvis. Backaches may result from overexertion of the muscles supporting your posture.  You may have changes in your hair. These can include thickening of your hair, rapid growth, and changes in texture. Some women also have hair loss during or after pregnancy, or hair that feels dry or thin. Your hair will most likely return to normal after your baby is born.  Your breasts will continue to grow and be tender. A yellow discharge may leak from your breasts called colostrum.  Your belly button may stick out.  You may feel short of breath because of your expanding uterus.  You may notice the fetus "dropping," or moving lower in your abdomen.  You may have a bloody mucus discharge. This usually occurs a few days to a week before labor begins.  Your cervix becomes thin and soft (effaced) near your due date. WHAT TO EXPECT AT YOUR PRENATAL  EXAMS  You will have prenatal exams every 2 weeks until week 36. Then, you will have weekly prenatal exams. During a routine prenatal visit:  You will be weighed to make sure you and the fetus are growing normally.  Your blood pressure is taken.  Your abdomen will be measured to track your baby's growth.  The fetal heartbeat will be listened to.  Any test results from the previous visit will be discussed.  You may have a cervical check near your due date to see if you have effaced. At around 36 weeks, your caregiver will check your cervix. At the same time, your caregiver will also perform a test on the secretions of the vaginal tissue. This test is to determine if a type of bacteria, Group B streptococcus, is present. Your caregiver will explain this further. Your caregiver may ask you:  What your birth plan is.  How you are feeling.  If you are feeling the baby move.  If you have had any abnormal symptoms, such as leaking fluid, bleeding, severe headaches, or abdominal cramping.  If you have any questions. Other tests or screenings that may be performed during your third trimester include:  Blood tests that check for low iron levels (anemia).  Fetal testing to check the health, activity level, and growth of the fetus. Testing is done if you have certain medical conditions or if there are problems during the pregnancy. FALSE LABOR You may feel small, irregular contractions that  eventually go away. These are called Braxton Hicks contractions, or false labor. Contractions may last for hours, days, or even weeks before true labor sets in. If contractions come at regular intervals, intensify, or become painful, it is best to be seen by your caregiver.  SIGNS OF LABOR   Menstrual-like cramps.  Contractions that are 5 minutes apart or less.  Contractions that start on the top of the uterus and spread down to the lower abdomen and back.  A sense of increased pelvic pressure or back  pain.  A watery or bloody mucus discharge that comes from the vagina. If you have any of these signs before the 37th week of pregnancy, call your caregiver right away. You need to go to the hospital to get checked immediately. HOME CARE INSTRUCTIONS   Avoid all smoking, herbs, alcohol, and unprescribed drugs. These chemicals affect the formation and growth of the baby.  Follow your caregiver's instructions regarding medicine use. There are medicines that are either safe or unsafe to take during pregnancy.  Exercise only as directed by your caregiver. Experiencing uterine cramps is a good sign to stop exercising.  Continue to eat regular, healthy meals.  Wear a good support bra for breast tenderness.  Do not use hot tubs, steam rooms, or saunas.  Wear your seat belt at all times when driving.  Avoid raw meat, uncooked cheese, cat litter boxes, and soil used by cats. These carry germs that can cause birth defects in the baby.  Take your prenatal vitamins.  Try taking a stool softener (if your caregiver approves) if you develop constipation. Eat more high-fiber foods, such as fresh vegetables or fruit and whole grains. Drink plenty of fluids to keep your urine clear or pale yellow.  Take warm sitz baths to soothe any pain or discomfort caused by hemorrhoids. Use hemorrhoid cream if your caregiver approves.  If you develop varicose veins, wear support hose. Elevate your feet for 15 minutes, 3-4 times a day. Limit salt in your diet.  Avoid heavy lifting, wear low heal shoes, and practice good posture.  Rest a lot with your legs elevated if you have leg cramps or low back pain.  Visit your dentist if you have not gone during your pregnancy. Use a soft toothbrush to brush your teeth and be gentle when you floss.  A sexual relationship may be continued unless your caregiver directs you otherwise.  Do not travel far distances unless it is absolutely necessary and only with the approval  of your caregiver.  Take prenatal classes to understand, practice, and ask questions about the labor and delivery.  Make a trial run to the hospital.  Pack your hospital bag.  Prepare the baby's nursery.  Continue to go to all your prenatal visits as directed by your caregiver. SEEK MEDICAL CARE IF:  You are unsure if you are in labor or if your water has broken.  You have dizziness.  You have mild pelvic cramps, pelvic pressure, or nagging pain in your abdominal area.  You have persistent nausea, vomiting, or diarrhea.  You have a bad smelling vaginal discharge.  You have pain with urination. SEEK IMMEDIATE MEDICAL CARE IF:   You have a fever.  You are leaking fluid from your vagina.  You have spotting or bleeding from your vagina.  You have severe abdominal cramping or pain.  You have rapid weight loss or gain.  You have shortness of breath with chest pain.  You notice sudden or extreme swelling  of your face, hands, ankles, feet, or legs. °· You have not felt your baby move in over an hour. °· You have severe headaches that do not go away with medicine. °· You have vision changes. °Document Released: 12/02/2001 Document Revised: 12/13/2013 Document Reviewed: 02/08/2013 °[employer]® Patient Information ©2015 [employer]. This information is not intended to replace advice given to you by your health care provider. Make sure you discuss any questions you have with your health care provider. °Fetal Movement Counts °Patient Name: __________________________________________________ Patient Due Date: ____________________ °Performing a fetal movement count is highly recommended in high-risk pregnancies, but it is good for every pregnant woman to do. Your health care provider may ask you to start counting fetal movements at 28 weeks of the pregnancy. Fetal movements often increase: °· After eating a full meal. °· After physical activity. °· After eating or drinking something sweet or  cold. °· At rest. °Pay attention to when you feel the baby is most active. This will help you notice a pattern of your baby's sleep and wake cycles and what factors contribute to an increase in fetal movement. It is important to perform a fetal movement count at the same time each day when your baby is normally most active.  °HOW TO COUNT FETAL MOVEMENTS °· Find a quiet and comfortable area to sit or lie down on your left side. Lying on your left side provides the best blood and oxygen circulation to your baby. °· Write down the day and time on a sheet of paper or in a journal. °· Start counting kicks, flutters, swishes, rolls, or jabs in a 2-hour period. You should feel at least 10 movements within 2 hours. °· If you do not feel 10 movements in 2 hours, wait 2-3 hours and count again. Look for a change in the pattern or not enough counts in 2 hours. °SEEK MEDICAL CARE IF: °· You feel less than 10 counts in 2 hours, tried twice. °· There is no movement in over an hour. °· The pattern is changing or taking longer each day to reach 10 counts in 2 hours. °· You feel the baby is not moving as he or she usually does. °Date: ____________ Movements: ____________ Start time: ____________ Finish time: ____________  °Date: ____________ Movements: ____________ Start time: ____________ Finish time: ____________ °Date: ____________ Movements: ____________ Start time: ____________ Finish time: ____________ °Date: ____________ Movements: ____________ Start time: ____________ Finish time: ____________ °Date: ____________ Movements: ____________ Start time: ____________ Finish time: ____________ °Date: ____________ Movements: ____________ Start time: ____________ Finish time: ____________ °Date: ____________ Movements: ____________ Start time: ____________ Finish time: ____________ °Date: ____________ Movements: ____________ Start time: ____________ Finish time: ____________  °Date: ____________ Movements: ____________ Start time:  ____________ Finish time: ____________ °Date: ____________ Movements: ____________ Start time: ____________ Finish time: ____________ °Date: ____________ Movements: ____________ Start time: ____________ Finish time: ____________ °Date: ____________ Movements: ____________ Start time: ____________ Finish time: ____________ °Date: ____________ Movements: ____________ Start time: ____________ Finish time: ____________ °Date: ____________ Movements: ____________ Start time: ____________ Finish time: ____________ °Date: ____________ Movements: ____________ Start time: ____________ Finish time: ____________  °Date: ____________ Movements: ____________ Start time: ____________ Finish time: ____________ °Date: ____________ Movements: ____________ Start time: ____________ Finish time: ____________ °Date: ____________ Movements: ____________ Start time: ____________ Finish time: ____________ °Date: ____________ Movements: ____________ Start time: ____________ Finish time: ____________ °Date: ____________ Movements: ____________ Start time: ____________ Finish time: ____________ °Date: ____________ Movements: ____________ Start time: ____________ Finish time: ____________ °Date: ____________ Movements: ____________ Start time: ____________ Finish time:   ____________  °Date: ____________ Movements: ____________ Start time: ____________ Finish time: ____________ °Date: ____________ Movements: ____________ Start time: ____________ Finish time: ____________ °Date: ____________ Movements: ____________ Start time: ____________ Finish time: ____________ °Date: ____________ Movements: ____________ Start time: ____________ Finish time: ____________ °Date: ____________ Movements: ____________ Start time: ____________ Finish time: ____________ °Date: ____________ Movements: ____________ Start time: ____________ Finish time: ____________ °Date: ____________ Movements: ____________ Start time: ____________ Finish time: ____________  °Date:  ____________ Movements: ____________ Start time: ____________ Finish time: ____________ °Date: ____________ Movements: ____________ Start time: ____________ Finish time: ____________ °Date: ____________ Movements: ____________ Start time: ____________ Finish time: ____________ °Date: ____________ Movements: ____________ Start time: ____________ Finish time: ____________ °Date: ____________ Movements: ____________ Start time: ____________ Finish time: ____________ °Date: ____________ Movements: ____________ Start time: ____________ Finish time: ____________ °Date: ____________ Movements: ____________ Start time: ____________ Finish time: ____________  °Date: ____________ Movements: ____________ Start time: ____________ Finish time: ____________ °Date: ____________ Movements: ____________ Start time: ____________ Finish time: ____________ °Date: ____________ Movements: ____________ Start time: ____________ Finish time: ____________ °Date: ____________ Movements: ____________ Start time: ____________ Finish time: ____________ °Date: ____________ Movements: ____________ Start time: ____________ Finish time: ____________ °Date: ____________ Movements: ____________ Start time: ____________ Finish time: ____________ °Date: ____________ Movements: ____________ Start time: ____________ Finish time: ____________  °Date: ____________ Movements: ____________ Start time: ____________ Finish time: ____________ °Date: ____________ Movements: ____________ Start time: ____________ Finish time: ____________ °Date: ____________ Movements: ____________ Start time: ____________ Finish time: ____________ °Date: ____________ Movements: ____________ Start time: ____________ Finish time: ____________ °Date: ____________ Movements: ____________ Start time: ____________ Finish time: ____________ °Date: ____________ Movements: ____________ Start time: ____________ Finish time: ____________ °Date: ____________ Movements: ____________ Start  time: ____________ Finish time: ____________  °Date: ____________ Movements: ____________ Start time: ____________ Finish time: ____________ °Date: ____________ Movements: ____________ Start time: ____________ Finish time: ____________ °Date: ____________ Movements: ____________ Start time: ____________ Finish time: ____________ °Date: ____________ Movements: ____________ Start time: ____________ Finish time: ____________ °Date: ____________ Movements: ____________ Start time: ____________ Finish time: ____________ °Date: ____________ Movements: ____________ Start time: ____________ Finish time: ____________ °Document Released: 01/07/2007 Document Revised: 04/24/2014 Document Reviewed: 10/04/2012 °[employer]® Patient Information ©2015 [employer]. This information is not intended to replace advice given to you by your health care provider. Make sure you discuss any questions you have with your health care provider. ° °

## 2014-11-15 NOTE — MAU Note (Signed)
Pt states she was at the MD's office earlier today and was examined

## 2014-11-15 NOTE — MAU Note (Signed)
Contractions every 2-4 min for 45 min.  No bleeding.  No leaking.  Baby moving well. 2 cm earlier today.

## 2014-11-17 ENCOUNTER — Inpatient Hospital Stay (HOSPITAL_COMMUNITY)
Admission: AD | Admit: 2014-11-17 | Discharge: 2014-11-17 | Disposition: A | Discharge status: Home / Self Care | Payer: Medicaid Other | Source: Ambulatory Visit | Attending: Obstetrics and Gynecology | Admitting: Obstetrics and Gynecology

## 2014-11-17 DIAGNOSIS — O368130 Decreased fetal movements, third trimester, not applicable or unspecified: Secondary | ICD-10-CM | POA: Insufficient documentation

## 2014-11-17 DIAGNOSIS — Z3A37 37 weeks gestation of pregnancy: Secondary | ICD-10-CM | POA: Insufficient documentation

## 2014-11-17 NOTE — MAU Provider Note (Signed)
NST reactive  and category 1

## 2014-11-17 NOTE — MAU Note (Signed)
Urine in lab 

## 2014-11-18 ENCOUNTER — Inpatient Hospital Stay (HOSPITAL_COMMUNITY): Payer: Medicaid Other | Admitting: Anesthesiology

## 2014-11-18 ENCOUNTER — Encounter (HOSPITAL_COMMUNITY): Payer: Self-pay | Admitting: *Deleted

## 2014-11-18 ENCOUNTER — Inpatient Hospital Stay (HOSPITAL_COMMUNITY)
Admission: AD | Admit: 2014-11-18 | Discharge: 2014-11-20 | DRG: 775 | Disposition: A | Discharge status: Home / Self Care | Payer: Medicaid Other | Source: Ambulatory Visit | Attending: Obstetrics and Gynecology | Admitting: Obstetrics and Gynecology

## 2014-11-18 DIAGNOSIS — Z3493 Encounter for supervision of normal pregnancy, unspecified, third trimester: Secondary | ICD-10-CM

## 2014-11-18 DIAGNOSIS — K589 Irritable bowel syndrome without diarrhea: Secondary | ICD-10-CM | POA: Diagnosis present

## 2014-11-18 DIAGNOSIS — F419 Anxiety disorder, unspecified: Secondary | ICD-10-CM | POA: Diagnosis present

## 2014-11-18 DIAGNOSIS — O24420 Gestational diabetes mellitus in childbirth, diet controlled: Principal | ICD-10-CM | POA: Diagnosis present

## 2014-11-18 DIAGNOSIS — O99343 Other mental disorders complicating pregnancy, third trimester: Secondary | ICD-10-CM | POA: Diagnosis present

## 2014-11-18 DIAGNOSIS — Z3A39 39 weeks gestation of pregnancy: Secondary | ICD-10-CM | POA: Diagnosis present

## 2014-11-18 DIAGNOSIS — Z3490 Encounter for supervision of normal pregnancy, unspecified, unspecified trimester: Secondary | ICD-10-CM

## 2014-11-18 DIAGNOSIS — Z825 Family history of asthma and other chronic lower respiratory diseases: Secondary | ICD-10-CM | POA: Diagnosis not present

## 2014-11-18 DIAGNOSIS — Z3480 Encounter for supervision of other normal pregnancy, unspecified trimester: Secondary | ICD-10-CM

## 2014-11-18 DIAGNOSIS — O99613 Diseases of the digestive system complicating pregnancy, third trimester: Secondary | ICD-10-CM | POA: Diagnosis present

## 2014-11-18 DIAGNOSIS — O99824 Streptococcus B carrier state complicating childbirth: Secondary | ICD-10-CM | POA: Diagnosis present

## 2014-11-18 LAB — CBC
HEMATOCRIT: 35.9 % — AB (ref 36.0–46.0)
Hemoglobin: 11.7 g/dL — ABNORMAL LOW (ref 12.0–15.0)
MCH: 27.1 pg (ref 26.0–34.0)
MCHC: 32.6 g/dL (ref 30.0–36.0)
MCV: 83.1 fL (ref 78.0–100.0)
PLATELETS: 165 10*3/uL (ref 150–400)
RBC: 4.32 MIL/uL (ref 3.87–5.11)
RDW: 13.8 % (ref 11.5–15.5)
WBC: 7.9 10*3/uL (ref 4.0–10.5)

## 2014-11-18 LAB — RPR

## 2014-11-18 LAB — TYPE AND SCREEN
ABO/RH(D): O POS
Antibody Screen: NEGATIVE

## 2014-11-18 LAB — GLUCOSE, CAPILLARY
GLUCOSE-CAPILLARY: 93 mg/dL (ref 70–99)
Glucose-Capillary: 89 mg/dL (ref 70–99)
Glucose-Capillary: 73 mg/dL (ref 70–99)

## 2014-11-18 MED ORDER — DIPHENHYDRAMINE HCL 50 MG/ML IJ SOLN: 12.5000 mg | INTRAMUSCULAR | Status: DC | PRN

## 2014-11-18 MED ORDER — OXYTOCIN BOLUS FROM INFUSION
500.0000 mL | INTRAVENOUS | Status: DC
  Administered 2014-11-18: 500 mL via INTRAVENOUS

## 2014-11-18 MED ORDER — WITCH HAZEL-GLYCERIN EX PADS: 1.0000 "application " | MEDICATED_PAD | CUTANEOUS | Status: DC | PRN

## 2014-11-18 MED ORDER — METHYLERGONOVINE MALEATE 0.2 MG PO TABS
0.2000 mg | ORAL_TABLET | ORAL | Status: DC | PRN
  Filled 2014-11-18: qty 1

## 2014-11-18 MED ORDER — OXYCODONE-ACETAMINOPHEN 5-325 MG PO TABS: 2.0000 | ORAL_TABLET | ORAL | Status: DC | PRN

## 2014-11-18 MED ORDER — ONDANSETRON HCL 4 MG/2ML IJ SOLN: 4.0000 mg | Freq: Four times a day (QID) | INTRAMUSCULAR | Status: DC | PRN

## 2014-11-18 MED ORDER — CYCLOBENZAPRINE HCL 10 MG PO TABS
10.0000 mg | ORAL_TABLET | Freq: Three times a day (TID) | ORAL | Status: DC | PRN
  Filled 2014-11-18: qty 1

## 2014-11-18 MED ORDER — DIPHENHYDRAMINE HCL 25 MG PO CAPS
25.0000 mg | ORAL_CAPSULE | Freq: Four times a day (QID) | ORAL | Status: DC | PRN
  Administered 2014-11-18 – 2014-11-19 (×2): 25 mg via ORAL
  Filled 2014-11-18 (×2): qty 1

## 2014-11-18 MED ORDER — BENZOCAINE-MENTHOL 20-0.5 % EX AERO: 1.0000 "application " | INHALATION_SPRAY | CUTANEOUS | Status: DC | PRN

## 2014-11-18 MED ORDER — EPHEDRINE 5 MG/ML INJ
10.0000 mg | INTRAVENOUS | Status: DC | PRN
  Filled 2014-11-18: qty 2

## 2014-11-18 MED ORDER — TETANUS-DIPHTH-ACELL PERTUSSIS 5-2.5-18.5 LF-MCG/0.5 IM SUSP
0.5000 mL | Freq: Once | INTRAMUSCULAR | Status: AC
  Administered 2014-11-19: 0.5 mL via INTRAMUSCULAR
  Filled 2014-11-18: qty 0.5

## 2014-11-18 MED ORDER — OXYTOCIN 40 UNITS IN LACTATED RINGERS INFUSION - SIMPLE MED: 62.5000 mL/h | INTRAVENOUS | Status: DC

## 2014-11-18 MED ORDER — LACTATED RINGERS IV SOLN
500.0000 mL | INTRAVENOUS | Status: DC | PRN
  Administered 2014-11-18: 500 mL via INTRAVENOUS

## 2014-11-18 MED ORDER — IBUPROFEN 600 MG PO TABS
600.0000 mg | ORAL_TABLET | Freq: Four times a day (QID) | ORAL | Status: DC
  Administered 2014-11-18 – 2014-11-20 (×7): 600 mg via ORAL
  Filled 2014-11-18 (×7): qty 1

## 2014-11-18 MED ORDER — LIDOCAINE HCL (PF) 1 % IJ SOLN
INTRAMUSCULAR | Status: DC | PRN
  Administered 2014-11-18: 6 mL
  Administered 2014-11-18: 4 mL

## 2014-11-18 MED ORDER — SIMETHICONE 80 MG PO CHEW: 80.0000 mg | CHEWABLE_TABLET | ORAL | Status: DC | PRN

## 2014-11-18 MED ORDER — TERBUTALINE SULFATE 1 MG/ML IJ SOLN: 0.2500 mg | Freq: Once | INTRAMUSCULAR | Status: DC | PRN

## 2014-11-18 MED ORDER — LACTATED RINGERS IV SOLN
INTRAVENOUS | Status: DC
  Administered 2014-11-18 (×2): via INTRAVENOUS

## 2014-11-18 MED ORDER — MAGNESIUM HYDROXIDE 400 MG/5ML PO SUSP: 30.0000 mL | ORAL | Status: DC | PRN

## 2014-11-18 MED ORDER — OXYCODONE-ACETAMINOPHEN 5-325 MG PO TABS
1.0000 | ORAL_TABLET | ORAL | Status: DC | PRN
  Administered 2014-11-19 – 2014-11-20 (×5): 1 via ORAL
  Filled 2014-11-18 (×5): qty 1

## 2014-11-18 MED ORDER — PENICILLIN G POTASSIUM 5000000 UNITS IJ SOLR
2.5000 10*6.[IU] | INTRAVENOUS | Status: DC
  Administered 2014-11-18 (×2): 2.5 10*6.[IU] via INTRAVENOUS
  Filled 2014-11-18 (×4): qty 2.5

## 2014-11-18 MED ORDER — OXYTOCIN 40 UNITS IN LACTATED RINGERS INFUSION - SIMPLE MED
1.0000 m[IU]/min | INTRAVENOUS | Status: DC
  Administered 2014-11-18: 1 m[IU]/min via INTRAVENOUS
  Filled 2014-11-18: qty 1000

## 2014-11-18 MED ORDER — PRENATAL MULTIVITAMIN CH
1.0000 | ORAL_TABLET | Freq: Every day | ORAL | Status: DC
  Administered 2014-11-19 – 2014-11-20 (×2): 1 via ORAL
  Filled 2014-11-18 (×2): qty 1

## 2014-11-18 MED ORDER — DEXTROSE 5 % IV SOLN
5.0000 10*6.[IU] | Freq: Once | INTRAVENOUS | Status: AC
  Administered 2014-11-18: 5 10*6.[IU] via INTRAVENOUS
  Filled 2014-11-18: qty 5

## 2014-11-18 MED ORDER — CITRIC ACID-SODIUM CITRATE 334-500 MG/5ML PO SOLN: 30.0000 mL | ORAL | Status: DC | PRN

## 2014-11-18 MED ORDER — METHYLERGONOVINE MALEATE 0.2 MG/ML IJ SOLN: 0.2000 mg | INTRAMUSCULAR | Status: DC | PRN

## 2014-11-18 MED ORDER — FENTANYL 2.5 MCG/ML BUPIVACAINE 1/10 % EPIDURAL INFUSION (WH - ANES)
14.0000 mL/h | INTRAMUSCULAR | Status: DC | PRN
  Administered 2014-11-18: 14 mL/h via EPIDURAL
  Filled 2014-11-18: qty 125

## 2014-11-18 MED ORDER — PHENYLEPHRINE 40 MCG/ML (10ML) SYRINGE FOR IV PUSH (FOR BLOOD PRESSURE SUPPORT)
80.0000 ug | PREFILLED_SYRINGE | INTRAVENOUS | Status: DC | PRN
Start: 2014-11-18 — End: 2014-11-18
  Filled 2014-11-18: qty 10
  Filled 2014-11-18: qty 2

## 2014-11-18 MED ORDER — MEASLES, MUMPS & RUBELLA VAC ~~LOC~~ INJ
0.5000 mL | INJECTION | Freq: Once | SUBCUTANEOUS | Status: DC
  Filled 2014-11-18: qty 0.5

## 2014-11-18 MED ORDER — SENNOSIDES-DOCUSATE SODIUM 8.6-50 MG PO TABS
2.0000 | ORAL_TABLET | ORAL | Status: DC
  Administered 2014-11-18 – 2014-11-20 (×2): 2 via ORAL
  Filled 2014-11-18 (×2): qty 2

## 2014-11-18 MED ORDER — LIDOCAINE HCL (PF) 1 % IJ SOLN
30.0000 mL | INTRAMUSCULAR | Status: DC | PRN
  Filled 2014-11-18: qty 30

## 2014-11-18 MED ORDER — LACTATED RINGERS IV SOLN
500.0000 mL | Freq: Once | INTRAVENOUS | Status: AC
  Administered 2014-11-18: 500 mL via INTRAVENOUS

## 2014-11-18 MED ORDER — PHENYLEPHRINE 40 MCG/ML (10ML) SYRINGE FOR IV PUSH (FOR BLOOD PRESSURE SUPPORT)
80.0000 ug | PREFILLED_SYRINGE | INTRAVENOUS | Status: DC | PRN
  Filled 2014-11-18: qty 2

## 2014-11-18 MED ORDER — ONDANSETRON HCL 4 MG/2ML IJ SOLN: 4.0000 mg | INTRAMUSCULAR | Status: DC | PRN

## 2014-11-18 MED ORDER — LANOLIN HYDROUS EX OINT: TOPICAL_OINTMENT | CUTANEOUS | Status: DC | PRN

## 2014-11-18 MED ORDER — OXYCODONE-ACETAMINOPHEN 5-325 MG PO TABS: 1.0000 | ORAL_TABLET | ORAL | Status: DC | PRN

## 2014-11-18 MED ORDER — ZOLPIDEM TARTRATE 5 MG PO TABS: 5.0000 mg | ORAL_TABLET | Freq: Every evening | ORAL | Status: DC | PRN

## 2014-11-18 MED ORDER — ALBUTEROL SULFATE (2.5 MG/3ML) 0.083% IN NEBU: 3.0000 mL | INHALATION_SOLUTION | Freq: Four times a day (QID) | RESPIRATORY_TRACT | Status: DC | PRN

## 2014-11-18 MED ORDER — ONDANSETRON HCL 4 MG PO TABS: 4.0000 mg | ORAL_TABLET | ORAL | Status: DC | PRN

## 2014-11-18 MED ORDER — FENTANYL 2.5 MCG/ML BUPIVACAINE 1/10 % EPIDURAL INFUSION (WH - ANES): 14.0000 mL/h | INTRAMUSCULAR | Status: DC | PRN

## 2014-11-18 MED ORDER — BUTORPHANOL TARTRATE 1 MG/ML IJ SOLN: 1.0000 mg | INTRAMUSCULAR | Status: DC | PRN

## 2014-11-18 MED ORDER — DIBUCAINE 1 % RE OINT: 1.0000 "application " | TOPICAL_OINTMENT | RECTAL | Status: DC | PRN

## 2014-11-18 NOTE — Anesthesia Preprocedure Evaluation (Addendum)
Anesthesia Evaluation  Patient identified by MRN, date of birth, ID band Patient awake    Reviewed: Allergy & Precautions, H&P , Patient's Chart, lab work & pertinent test results  Airway Mallampati: II  TM Distance: >3 FB Neck ROM: full    Dental no notable dental hx.    Pulmonary  breath sounds clear to auscultation  Pulmonary exam normal       Cardiovascular Exercise Tolerance: Good Rhythm:regular Rate:Normal     Neuro/Psych    GI/Hepatic   Endo/Other  diabetes, Gestational  Renal/GU      Musculoskeletal   Abdominal   Peds  Hematology   Anesthesia Other Findings   Reproductive/Obstetrics                            Anesthesia Physical Anesthesia Plan  ASA: III  Anesthesia Plan: Epidural   Post-op Pain Management:    Induction:   Airway Management Planned:   Additional Equipment:   Intra-op Plan:   Post-operative Plan:   Informed Consent: I have reviewed the patients History and Physical, chart, labs and discussed the procedure including the risks, benefits and alternatives for the proposed anesthesia with the patient or authorized representative who has indicated his/her understanding and acceptance.   Dental Advisory Given  Plan Discussed with:   Anesthesia Plan Comments: (Labs checked- platelets confirmed with RN in room. Fetal heart tracing, per RN, reported to be stable enough for sitting procedure. Discussed epidural, and patient consents to the procedure:  included risk of possible headache,backache, failed block, allergic reaction, and nerve injury. This patient was asked if she had any questions or concerns before the procedure started.)        Anesthesia Quick Evaluation

## 2014-11-18 NOTE — H&P (Signed)
Ashley Casey is a 33 y.o. female Ashley Casey at 60 0/7 weeks (EDD 11/25/14 by LMP c/Casey 6 week [place]) presenting for painful regular contractions. Prenatal care complicated by GDM controlled on Glyburide, current dose at 2,5 mg po BID. She has been followed by NST's 2x weekly.  [place] for size > dates about 33 weeks showed baby at the 68%ile and normal AFI.  She is also GBS positive. Maternal Medical History:  Reason for admission: Contractions.   Contractions: Onset was 6-12 hours ago.   Frequency: regular.   Perceived severity is moderate.    Fetal activity: Perceived fetal activity is normal.    Prenatal Complications - Diabetes: gestational. Diabetes is managed by oral agent (monotherapy).      OB History    Gravida Para Term Preterm AB TAB SAB Ectopic Multiple Living   8 4 4  3 1 2   4     NSVD x 4 (1998, 2004, 2006, 2011) weights 7#3oz-8#11oz  Past Medical History  Diagnosis Date  . Fatigue   . IBS (irritable bowel syndrome)   . Cholelithiasis   . Anxiety   . Neuromuscular disorder   . Abnormal Pap smear   . Gestational diabetes mellitus, antepartum    Past Surgical History  Procedure Laterality Date  . 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 Cholecystectomy  8/29/121  . Therapeutic abortion    . Wisdom tooth extraction     Family History: family history includes Arthritis in her brother; Asthma in her brother; Other in her mother. Social History:  reports that she has never smoked. She has never used smokeless tobacco. She reports that she does not drink alcohol or use illicit drugs.   Prenatal Transfer Tool  Maternal Diabetes: Yes:  Diabetes Type:  Insulin/Medication controlled Genetic Screening: Normal Maternal Ultrasounds/Referrals: Normal Fetal Ultrasounds or other Referrals:  None Maternal Substance Abuse:  No Significant Maternal Medications:  Meds include: Other:  glyburide Significant Maternal Lab Results:  Lab values include: Group B Strep positive Other  Comments:  None  ROS  Dilation: 2 Effacement (%): 70 Station: -2 Exam by:: Ashley Casey Blood pressure 107/65, pulse 89, temperature 97.3 F (36.3 C), resp. rate 22, height 5\' 4"  (1.626 m), weight 97.523 kg (215 lb), last menstrual period 02/17/2014. Maternal Exam:  Uterine Assessment: Contraction strength is moderate.  Contraction frequency is regular.   Abdomen: Patient reports no abdominal tenderness. Fetal presentation: vertex  Introitus: Normal vulva. Normal vagina.    Physical Exam  Constitutional: She appears well-developed.  Cardiovascular: Normal rate and regular rhythm.   Respiratory: Effort normal.  GI: Soft.  Genitourinary: Vagina normal.  Gravid  Neurological: She is alert.  Psychiatric: She has a normal mood and affect.    Prenatal labs: ABO, Rh: O/POS/-- (05/11 1342) Antibody: NEG (05/11 1342) Rubella: 1.52 (05/11 1342) RPR: NON REAC (05/11 1342)  HBsAg: NEGATIVE (05/11 1342)  HIV: NONREACTIVE (05/11 1342)  GBS: positive One hour GTT 189 Three hour GTT 107/218/225/152 Hgb AA Quad screen WNL   Assessment/Plan: Pt presented with regular strong contractions and cervix initially did not change, 2cm.  She requested to walk for an additional 2 hours as was uncomfortable with her contractions, will reassess after 2 hours from last check  Ashley Casey 11/18/2014, 6:27 AM

## 2014-11-18 NOTE — Progress Notes (Signed)
Dr Willis Modena called in and given update as to pt's status. Aware of ctx pattern, sve without change x 3, fhr currently flat with no variability but earlier reactive. Dr Willis Modena will see pt and discuss plan of care with pt

## 2014-11-18 NOTE — Plan of Care (Signed)
Problem: Phase I Progression Outcomes Goal: Obtain and review prenatal records Outcome: Completed/Met Date Met:  11/18/14

## 2014-11-18 NOTE — Progress Notes (Signed)
Dr Marvel Plan notified of repeat sve and ctx pattern. Aware fhr reactive. Pt may go home or walk and be rechecked - whatever pt prefers.

## 2014-11-18 NOTE — MAU Note (Signed)
Contractions since 2200. No bleeding or LOF.

## 2014-11-18 NOTE — Progress Notes (Signed)
Still feeling ctx Afeb, VSS FHT- Cat I, ctx q 3-5 min VE-3/80/-1, vtx Will admit, augment with Pitocin, PCN for +GBS, monitor glucose

## 2014-11-18 NOTE — Progress Notes (Signed)
Comfortable with epidural Afeb, VSS FHT- Cat II, occasional variable decel VE-6-7/70/-2, vtx, AROM clear Continue pitocin, monitor progress, anticipate SVD

## 2014-11-18 NOTE — MAU Note (Signed)
Per Nira Conn, RN charge, pt to go to room 171. EFM tracing viewed/discussed.

## 2014-11-18 NOTE — Anesthesia Procedure Notes (Signed)

## 2014-11-18 NOTE — MAU Note (Signed)
Pt would like to stay and walk awhile and be rechecked

## 2014-11-18 NOTE — Plan of Care (Signed)
Problem: Phase I Progression Outcomes Goal: Assess per MD/Nurse,Routine-VS,FHR,UC,Head to Toe assess Outcome: Completed/Met Date Met:  11/18/14

## 2014-11-18 NOTE — Plan of Care (Signed)
Problem: Phase I Progression Outcomes Goal: OOB as tolerated unless otherwise ordered Outcome: Completed/Met Date Met:  11/18/14     

## 2014-11-18 NOTE — Progress Notes (Signed)
Pt up to walk.

## 2014-11-18 NOTE — Plan of Care (Signed)
Problem: Phase I Progression Outcomes Goal: Induction meds as ordered Outcome: Completed/Met Date Met:  11/18/14

## 2014-11-18 NOTE — MAU Note (Signed)
Up to BR.

## 2014-11-18 NOTE — Plan of Care (Signed)
Problem: Phase I Progression Outcomes Goal: FHR checked 5 minutes after meds (ROM) Rupture of Membranes Outcome: Completed/Met Date Met:  11/18/14     

## 2014-11-18 NOTE — Plan of Care (Signed)
Problem: Phase I Progression Outcomes Goal: Pain controlled with appropriate interventions Outcome: Completed/Met Date Met:  11/18/14     

## 2014-11-18 NOTE — Progress Notes (Signed)
Dr Marvel Plan notifed of pt's admission and status. Aware of ctx pattern, sve, fhr with minimal variability and 10x10 accels with not decels. Will reck at  0500 or sooner if needed

## 2014-11-19 NOTE — Lactation Note (Signed)
This note was copied from the chart of Ashley Casey. Lactation Consultation Note: Called to mom's room. She reports that she obtained about 5 cc's of Colostrum. Asking what she should do with it. Encouraged to save it and feed at the next feeding after breastfeeding. Reviewed cleaning of pump parts and milk storage guidelines. No further questions at present.   Patient Name: Ashley Casey [id number] Date: 11/19/2014 Reason for consult: Initial assessment   Maternal Data Formula Feeding for Exclusion: No Does the patient have breastfeeding experience prior to this delivery?: Yes  Feeding    LATCH Score/Interventions                      Lactation Tools Discussed/Used     Consult Status Consult Status: Follow-up Date: 11/19/14 Follow-up type: In-patient    Truddie Crumble 11/19/2014, 2:35 PM

## 2014-11-19 NOTE — Lactation Note (Signed)
This note was copied from the chart of Ashley Casey. Lactation Consultation Note Follow up visit at 23 hours, mom is requesting assistance with latching and [facility] RN unable to assist at this time.  Mom has bottle in baby's mouth and reports she needs help with breastfeeding.  Mom reports 4 years, 2 years and 18 months experience with older children, but her youngest didn't latch and she pumped for 3 months. Mom's presentation does not match this level of experience.  Baby is very sleepy at the breast.  Encouraged mom to wait until baby is showing more feeding cues to eat, and encouraged mom to not offer bottle if she plans to breastfeed.  Baby does open mouth some for latch and sucks on tip of nipple causing nipple pain.   Several attempts to get baby a wide open mouth and baby just hold nipple in mouth, finally after several minutes baby maintain suck for a few minutes with strong sucking bursts and long pauses.  Mom's motivation is questionable.  Mom has DEBP at bedside and is able to independently able to latch baby at the breast but asks for a lot of assist.  Encouraged mom to continue to independently latch and call for assist as needed.  Report given to [facility] RN.         Patient Name: Ashley Ifrah Vest [id number] Date: 11/19/2014 Reason for consult: Follow-up assessment;Difficult latch   Maternal Data    Feeding Feeding Type: Breast Fed  LATCH Score/Interventions Latch: Repeated attempts needed to sustain latch, nipple held in mouth throughout feeding, stimulation needed to elicit sucking reflex. Intervention(s): Skin to skin;Teach feeding cues;Waking techniques Intervention(s): Assist with latch;Breast compression  Audible Swallowing: A few with stimulation  Type of Nipple: Everted at rest and after stimulation  Comfort (Breast/Nipple): Soft / non-tender     Hold (Positioning): Assistance needed to correctly position infant at breast and maintain latch. Intervention(s): Skin to  skin;Position options;Support Pillows;Breastfeeding basics reviewed  LATCH Score: 7  Lactation Tools Discussed/Used     Consult Status Consult Status: Follow-up Date: 11/20/14 Follow-up type: In-patient    Kendell Bane Justine Null 11/19/2014, 7:52 PM

## 2014-11-19 NOTE — Progress Notes (Signed)
PPD #1 No problems Afeb, VSS Fundus firm, NT at U-1 Continue routine postpartum care 

## 2014-11-19 NOTE — Lactation Note (Signed)
This note was copied from the chart of Ashley Casey. Lactation Consultation Note Follow up visit.  Baby has had several bottle and few breast attempts.  Baby previously was supplemented with unstable blood sugars, but is stable now.  Mom was started on a DEBp earlier today.  Attempted to latch in football hold on right breast, but baby is too sleepy to wake for latch.  Drops of colostrum expressed to baby's lips and baby is now STS on mom's  Chest.  Mom has several years experience with breastfeeding older children.  Encouraged mom to keep baby STS and wait for feeding cues.  Mom plans to pump when baby does not latch.  MOm to call for assist as needed.    Patient Name: Ashley Casey Date: 11/19/2014 Reason for consult: Follow-up assessment;Difficult latch   Maternal Data    Feeding Feeding Type: Breast Fed  LATCH Score/Interventions Latch: Too sleepy or reluctant, no latch achieved, no sucking elicited. Intervention(s): Skin to skin;Waking techniques;Teach feeding cues Intervention(s): Assist with latch;Breast compression  Audible Swallowing: None  Type of Nipple: Everted at rest and after stimulation  Comfort (Breast/Nipple): Soft / non-tender     Hold (Positioning): Assistance needed to correctly position infant at breast and maintain latch. Intervention(s): Breastfeeding basics reviewed;Support Pillows;Position options;Skin to skin  LATCH Score: 5  Lactation Tools Discussed/Used     Consult Status Consult Status: Follow-up Date: 11/20/14 Follow-up type: In-patient    Ashley Casey, Ashley Casey 11/19/2014, 7:10 PM

## 2014-11-19 NOTE — Lactation Note (Signed)
This note was copied from the chart of Ashley Jolina Symonds. Lactation Consultation Note; Initial visit with this experienced BF mom. She reports that baby nursed well after delivery. Had formula due to low blood sugar. Has not nursed much since. Mom just gave 10 cc's of formula about 15 minutes ago. Mom has DEBP set up in room- has pumped twice. Encouraged to pump since she gave formula to promote milk supply. Encouraged to watch for feeding cues and feed whenever she sees them. Reviewed normal behavior the first 24 hours. Discussed cluster feeding the second night. No questions at present. To call for assist when baby ready for next feeding. BF brochure given with resources for support after DC.  Patient Name: Ashley Casey [id number] Date: 11/19/2014 Reason for consult: Initial assessment   Maternal Data Formula Feeding for Exclusion: No Does the patient have breastfeeding experience prior to this delivery?: Yes  Feeding    LATCH Score/Interventions                      Lactation Tools Discussed/Used     Consult Status Consult Status: Follow-up Date: 11/19/14 Follow-up type: In-patient    Truddie Crumble 11/19/2014, 2:08 PM

## 2014-11-19 NOTE — Plan of Care (Signed)
Problem: Phase II Progression Outcomes Goal: Pain controlled on oral analgesia Outcome: Completed/Met Date Met:  11/19/14 Goal: Afebrile, VS remain stable Outcome: Completed/Met Date Met:  11/19/14 Goal: Other Phase II Outcomes/Goals Outcome: Completed/Met Date Met:  11/19/14  Problem: Discharge Progression Outcomes Goal: Activity appropriate for discharge plan Outcome: Completed/Met Date Met:  11/19/14 Goal: Tolerating diet Outcome: Completed/Met Date Met:  11/19/14 Goal: Pain controlled with appropriate interventions Outcome: Completed/Met Date Met:  11/19/14 Goal: Discharge plan in place and appropriate Outcome: Completed/Met Date Met:  11/19/14     

## 2014-11-19 NOTE — Anesthesia Postprocedure Evaluation (Signed)
Anesthesia Post Note  Patient: Ashley Casey  Procedure(s) Performed: * No procedures listed *  Anesthesia type: Epidural  Patient location: Mother/Baby  Post pain: Pain level controlled  Post assessment: Post-op Vital signs reviewed  Last Vitals:  Filed Vitals:   11/19/14 0427  BP: 94/55  Pulse: 81  Temp: 36.9 C  Resp: 16    Post vital signs: Reviewed  Level of consciousness:alert  Complications: No apparent anesthesia complications

## 2014-11-19 NOTE — Plan of Care (Signed)
Problem: Phase II Progression Outcomes Goal: Pain controlled on oral analgesia Outcome: Progressing

## 2014-11-19 NOTE — Plan of Care (Signed)
Problem: Phase I Progression Outcomes Goal: Pain controlled with appropriate interventions Outcome: Completed/Met Date Met:  11/19/14 Goal: Voiding adequately Outcome: Completed/Met Date Met:  11/19/14 Goal: OOB as tolerated unless otherwise ordered Outcome: Completed/Met Date Met:  11/19/14 Goal: VS, stable, temp < 100.4 degrees F Outcome: Completed/Met Date Met:  11/19/14 Goal: Initial discharge plan identified Outcome: Completed/Met Date Met:  11/19/14 Goal: Other Phase I Outcomes/Goals Outcome: Completed/Met Date Met:  11/19/14  Problem: Phase II Progression Outcomes Goal: Progress activity as tolerated unless otherwise ordered Outcome: Completed/Met Date Met:  11/19/14 Goal: Tolerating diet Outcome: Completed/Met Date Met:  11/19/14

## 2014-11-20 MED ORDER — IBUPROFEN 600 MG PO TABS: 600.0000 mg | ORAL_TABLET | Freq: Four times a day (QID) | ORAL | Status: DC

## 2014-11-20 MED ORDER — OXYCODONE-ACETAMINOPHEN 5-325 MG PO TABS: 1.0000 | ORAL_TABLET | ORAL | Status: DC | PRN

## 2014-11-20 NOTE — Plan of Care (Signed)
Problem: Consults Goal: Postpartum Patient Education (See Patient Education module for education specifics.)  Outcome: Completed/Met Date Met:  11/20/14  Problem: Discharge Progression Outcomes Goal: Barriers To Progression Addressed/Resolved Outcome: Completed/Met Date Met:  37/48/27 Goal: Complications resolved/controlled Outcome: Completed/Met Date Met:  11/20/14 Goal: Afebrile, VS remain stable at discharge Outcome: Completed/Met Date Met:  11/20/14 Goal: Other Discharge Outcomes/Goals Outcome: Not Applicable Date Met:  07/86/75

## 2014-11-20 NOTE — Discharge Summary (Signed)
Obstetric Discharge Summary Reason for Admission: onset of labor Prenatal Procedures: NST Intrapartum Procedures: spontaneous vaginal delivery Postpartum Procedures: none Complications-Operative and Postpartum: none HEMOGLOBIN  Date Value Ref Range Status  11/18/2014 11.7* 12.0 - 15.0 g/dL Final  07/13/2013 13.1 12.2 - 16.2 g/dL Final   HCT  Date Value Ref Range Status  11/18/2014 35.9* 36.0 - 46.0 % Final   HCT, POC  Date Value Ref Range Status  07/13/2013 42.9 37.7 - 47.9 % Final    Physical Exam:  General: alert Lochia: appropriate Uterine Fundus: firm   Discharge Diagnoses: Term Pregnancy-delivered and A2GDM  Discharge Information: Date: 11/20/2014 Activity: pelvic rest Diet: routine Medications: Ibuprofen and Percocet Condition: stable Instructions: refer to practice specific booklet Discharge to: home Follow-up Information    Follow up with Kimeka Badour D, MD. Schedule an appointment as soon as possible for a visit in 6 weeks.   Specialty:  Obstetrics and Gynecology   Contact information:   [address] [place] [address] [phone]       Newborn Data: Live born female  Birth Weight: 9 lb 2.4 oz (4150 g) APGAR: 8, 9  Home with mother.  Caterra Ostroff D 11/20/2014, 8:26 AM

## 2014-11-20 NOTE — Progress Notes (Signed)
PPD #2 Doing well, cramping with nursing Afeb, VSS Fundus firm D/c home

## 2014-11-20 NOTE — Discharge Instructions (Signed)
As per discharge pamphlet °

## 2014-11-20 NOTE — Lactation Note (Signed)
This note was copied from the chart of Ashley Kenneshia Rehm. Lactation Consultation Note; Called to mom's room. She reports that baby is still nursing since I was in there earlier. Has done both breast and still acts hungry. Mom has a couple of cc's of EBM in pump so we bottle feed that. Reminded mom baby has not nursed much in the last 24 hours and he is cluster feeding. Mom ordering breakfast so she can eat. No questions at present.   Patient Name: Ashley Casey [id number] Date: 11/20/2014 Reason for consult: Follow-up assessment   Maternal Data Formula Feeding for Exclusion: No Has patient been taught Hand Expression?: Yes Does the patient have breastfeeding experience prior to this delivery?: Yes  Feeding Feeding Type: Breast Fed Length of feed: 40 min  LATCH Score/Interventions Latch: Grasps breast easily, tongue down, lips flanged, rhythmical sucking.  Audible Swallowing: A few with stimulation  Type of Nipple: Everted at rest and after stimulation  Comfort (Breast/Nipple): Soft / non-tender     Hold (Positioning): Assistance needed to correctly position infant at breast and maintain latch. Intervention(s): Breastfeeding basics reviewed;Support Pillows;Position options;Skin to skin  LATCH Score: 8  Lactation Tools Discussed/Used     Consult Status Consult Status: Complete    Truddie Crumble 11/20/2014, 11:23 AM

## 2014-11-20 NOTE — Progress Notes (Signed)
UR chart review completed.  

## 2014-11-20 NOTE — Lactation Note (Signed)
This note was copied from the chart of Ashley Casey. Lactation Consultation Note: Follow up visit with mom before DC. Mom has been giving bottles of formula through the night- only one attempt to latch. Mom states she wants to breast feed- even if she has to pump. Mom has pumped a couple of times- obtains about 5 cc's Colostrum. Reports breasts are feeling fuller this morning. Reports baby last had formula 3-4 hours ago- she can't remember exactly when, Offered assist with latch. Baby took a couple of attempts then latched well and nursed for 20 min on the right breast, Mom reports no pain with nursing except for lots of cramping. Baby latched to other breast when I left room. Mom reports she has Medela pump for home. Encouraged to always put baby to the breast at each feeding. No questions at present. To call prn.   Patient Name: Ashley Casey [id number] Date: 11/20/2014 Reason for consult: Follow-up assessment   Maternal Data Formula Feeding for Exclusion: No Has patient been taught Hand Expression?: Yes Does the patient have breastfeeding experience prior to this delivery?: Yes  Feeding Feeding Type: Breast Fed  LATCH Score/Interventions Latch: Grasps breast easily, tongue down, lips flanged, rhythmical sucking.  Audible Swallowing: A few with stimulation  Type of Nipple: Everted at rest and after stimulation  Comfort (Breast/Nipple): Soft / non-tender     Hold (Positioning): Assistance needed to correctly position infant at breast and maintain latch. Intervention(s): Breastfeeding basics reviewed;Support Pillows;Position options;Skin to skin  LATCH Score: 8  Lactation Tools Discussed/Used     Consult Status Consult Status: Complete    Truddie Crumble 11/20/2014, 10:20 AM

## 2014-12-19 ENCOUNTER — Emergency Department (HOSPITAL_COMMUNITY)
Admission: EM | Admit: 2014-12-19 | Discharge: 2014-12-19 | Disposition: A | Discharge status: Home / Self Care | Payer: Medicaid Other | Attending: Emergency Medicine | Admitting: Emergency Medicine

## 2014-12-19 ENCOUNTER — Encounter (HOSPITAL_COMMUNITY): Payer: Self-pay | Admitting: Emergency Medicine

## 2014-12-19 DIAGNOSIS — K589 Irritable bowel syndrome without diarrhea: Secondary | ICD-10-CM | POA: Diagnosis not present

## 2014-12-19 DIAGNOSIS — Z8659 Personal history of other mental and behavioral disorders: Secondary | ICD-10-CM | POA: Insufficient documentation

## 2014-12-19 DIAGNOSIS — R200 Anesthesia of skin: Secondary | ICD-10-CM | POA: Diagnosis present

## 2014-12-19 DIAGNOSIS — Z79899 Other long term (current) drug therapy: Secondary | ICD-10-CM | POA: Insufficient documentation

## 2014-12-19 DIAGNOSIS — G510 Bell's palsy: Secondary | ICD-10-CM | POA: Diagnosis not present

## 2014-12-19 DIAGNOSIS — Z8632 Personal history of gestational diabetes: Secondary | ICD-10-CM | POA: Insufficient documentation

## 2014-12-19 LAB — BASIC METABOLIC PANEL
ANION GAP: 5 (ref 5–15)
BUN: 7 mg/dL (ref 6–23)
CALCIUM: 9.2 mg/dL (ref 8.4–10.5)
CHLORIDE: 110 meq/L (ref 96–112)
CO2: 28 mmol/L (ref 19–32)
Creatinine, Ser: 0.65 mg/dL (ref 0.50–1.10)
GFR calc Af Amer: >90 mL/min (ref >90)
GFR calc non Af Amer: >90 mL/min (ref >90)
GLUCOSE: 148 mg/dL — AB (ref 70–99)
POTASSIUM: 3.2 mmol/L — AB (ref 3.5–5.1)
SODIUM: 143 mmol/L (ref 135–145)

## 2014-12-19 LAB — CBC
HCT: 43.1 % (ref 36.0–46.0)
HEMOGLOBIN: 13.2 g/dL (ref 12.0–15.0)
MCH: 25.7 pg — AB (ref 26.0–34.0)
MCHC: 30.6 g/dL (ref 30.0–36.0)
MCV: 84 fL (ref 78.0–100.0)
Platelets: 218 10*3/uL (ref 150–400)
RBC: 5.13 MIL/uL — ABNORMAL HIGH (ref 3.87–5.11)
RDW: 13 % (ref 11.5–15.5)
WBC: 6.6 10*3/uL (ref 4.0–10.5)

## 2014-12-19 MED ORDER — PREDNISONE 20 MG PO TABS: 60.0000 mg | ORAL_TABLET | Freq: Once | ORAL | Status: DC

## 2014-12-19 MED ORDER — VALACYCLOVIR HCL 1 G PO TABS: ORAL_TABLET | ORAL | Status: DC

## 2014-12-19 NOTE — ED Provider Notes (Signed)
CSN: [id number]     Arrival date & time 12/19/14  1504 History   First MD Initiated Contact with Patient 12/19/14 1554     Chief Complaint  Patient presents with  . Facial Swelling     (Consider location/radiation/quality/duration/timing/severity/associated sxs/prior Treatment) HPI Ashley Casey is a 33 y.o. female with no significant past medical history comes in for evaluation of left facial numbness. Patient states last night at 10 PM she noticed her left cheek felt numb, "like a constant drip of novocaine". She reports it did not bother her very much last night, but this morning when she was trying to brush her teeth she could not swish and was drooling out of the left side of her mouth. She also reports difficulty closing her left eye. There is associated slurred speech. She denies any other symptoms. No headaches, chest pain, short of breath, abdominal pain, numbness or weakness anywhere else.  Patient recently gave birth and is lactating/breast-feeding.  Past Medical History  Diagnosis Date  . Fatigue   . IBS (irritable bowel syndrome)   . Cholelithiasis   . Anxiety   . Neuromuscular disorder   . Abnormal Pap smear   . Gestational diabetes mellitus, antepartum    Past Surgical History  Procedure Laterality Date  . 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 Cholecystectomy  8/29/121  . Therapeutic abortion    . Wisdom tooth extraction     Family History  Problem Relation Age of Onset  . Other Mother     sickle cell anemia  . Arthritis Brother   . Asthma Brother    History  Substance Use Topics  . Smoking status: Never Smoker   . Smokeless tobacco: Never Used  . Alcohol Use: No   OB History    Gravida Para Term Preterm AB TAB SAB Ectopic Multiple Living   8 5 5  3 1 2   0 5     Review of Systems A 10 point review of systems was completed and was negative except for pertinent positives and negatives as mentioned in the history of present illness      Allergies  Grapefruit concentrate; Orange juice; and Tegretol  Home Medications   Prior to Admission medications   Medication Sig Start Date End Date Taking? Authorizing Provider  cholecalciferol (VITAMIN D) 1000 UNITS tablet Take 1,000 Units by mouth every morning.   Yes Historical Provider, MD  cyclobenzaprine (FLEXERIL) 10 MG tablet Take 20 mg by mouth 3 (three) times daily as needed for muscle spasms (muscle spasms).    Yes Historical Provider, MD  ibuprofen (ADVIL,MOTRIN) 600 MG tablet Take 1 tablet (600 mg total) by mouth every 6 (six) hours. 11/20/14  Yes [name], MD  oxyCODONE-acetaminophen (PERCOCET/ROXICET) 5-325 MG per tablet Take 1-2 tablets by mouth every 4 (four) hours as needed (for pain scale less than 7). 11/20/14  Yes [name], MD  Prenatal Vit-Fe Fumarate-FA (PRENATAL MULTIVITAMIN) TABS tablet Take 1 tablet by mouth daily at 12 noon.   Yes Historical Provider, MD  acetaminophen (TYLENOL) 325 MG tablet Take 650 mg by mouth every 6 (six) hours as needed for mild pain.     Historical Provider, MD  albuterol (PROVENTIL HFA;VENTOLIN HFA) 108 (90 BASE) MCG/ACT inhaler Inhale 1 puff into the lungs every 6 (six) hours as needed for wheezing or shortness of breath.    Historical Provider, MD  Ca Carbonate-Mag Hydroxide (ROLAIDS PO) Take 2 tablets by mouth 2 (two) times daily as needed (  For heartburn.).    Historical Provider, MD  EPINEPHrine (EPIPEN 2-PAK) 0.3 mg/0.3 mL IJ SOAJ injection Inject 0.3 mg into the muscle once.    Historical Provider, MD  predniSONE (DELTASONE) 20 MG tablet Take 3 tablets (60 mg total) by mouth once. 12/19/14   Verl Dicker, PA-C  valACYclovir (VALTREX) 1000 MG tablet 1000 mg, 3 times per day, for 7 days 12/19/14   Viona Gilmore Cheo Selvey, PA-C   BP 134/95 mmHg  Pulse 74  Temp(Src) 98.2 F (36.8 C) (Oral)  Resp 18  SpO2 100% Physical Exam  Constitutional: She is oriented to person, place, and time. She appears well-developed and  well-nourished.  HENT:  Head: Normocephalic and atraumatic.  Mouth/Throat: Oropharynx is clear and moist.  No swelling appreciated to left cheek.  Eyes: Conjunctivae are normal. Pupils are equal, round, and reactive to light. Right eye exhibits no discharge. Left eye exhibits no discharge. No scleral icterus.  Neck: Neck supple.  Cardiovascular: Normal rate, regular rhythm and normal heart sounds.   Pulmonary/Chest: Effort normal and breath sounds normal. No respiratory distress. She has no wheezes. She has no rales.  Abdominal: Soft. There is no tenderness.  Musculoskeletal: She exhibits no tenderness.  Neurological: She is alert and oriented to person, place, and time.  Patient asked to raise eyebrows and left forehead remains smooth. She is unable to fully close left eye. Decreased sensation to light touch on the left cheek compared to right. Motor and sensation remain 5/5 in all 4 extremities. Patient gait appears baseline without any appreciable ataxia. She completes finger to nose coordination movements without any difficulty. There is no horizontal, vertical or rotational nystagmus.  Skin: Skin is warm and dry. No rash noted.  Psychiatric: She has a normal mood and affect.  Nursing note and vitals reviewed.   ED Course  Procedures (including critical care time) Labs Review Labs Reviewed  CBC - Abnormal; Notable for the following:    RBC 5.13 (*)    MCH 25.7 (*)    All other components within normal limits  BASIC METABOLIC PANEL - Abnormal; Notable for the following:    Potassium 3.2 (*)    Glucose, Bld 148 (*)    All other components within normal limits    Imaging Review No results found.   EKG Interpretation None     Meds given in ED:  Medications - No data to display  New Prescriptions   PREDNISONE (DELTASONE) 20 MG TABLET    Take 3 tablets (60 mg total) by mouth once.   VALACYCLOVIR (VALTREX) 1000 MG TABLET    1000 mg, 3 times per day, for 7 days   Filed  Vitals:   12/19/14 1524  BP: 134/95  Pulse: 74  Temp: 98.2 F (36.8 C)  TempSrc: Oral  Resp: 18  SpO2: 100%    MDM  Ashley Casey is a 33 y.o. female who recently gave birth, currently breast-feeding comes in for evaluation of left-sided facial numbness. Symptoms began last night at 10 PM and have progressed over today. Patient reports difficulty swishing fluids after brushing teeth. Denies any other peripheral numbness or weakness. Denies headache, vision changes, chest pain, short of breath, abdominal pain.  Physical exam, patient is unable to completely close left eye, decreased sensation over left cheek, left forehead remains smooth when trying to raise eyebrows. Motor and sensation remain 5/5 in all 4 extremities. Patient gait is baseline without any ataxia. She performs finger to nose coordination movements without any  difficulty. There is no vertical, horizontal or rotational nystagmus. Labs noncontributory.  Neuro exam consistent with Bell's palsy. No evidence of central lesion, ICH, SAH, or other emergent pathology. Spoke with pharmacy about administration of valacyclovir and steroids and safety in lactation. They reported that both prednisolone and valacyclovir are compatible with breast-feeding. Discussed importance of keeping eye lubricated, pt reports she has eye drops at home she can use. Will DC with steroids and antivirals, have patient follow-up with her PCP.  Prior to patient discharge, I discussed and reviewed this case with Dr. Mingo Amber, who also saw and evaluated the patient    Final diagnoses:  Bell's palsy        Verl Dicker, PA-C 12/20/14 [place], MD 12/21/14 [phone]

## 2014-12-19 NOTE — ED Notes (Signed)
Per patient, states she woke up this am and left side of face swollen and numb

## 2014-12-19 NOTE — ED Notes (Signed)
Bed: NT70 Expected date:  Expected time:  Means of arrival:  Comments: Hold for triage

## 2014-12-19 NOTE — Discharge Instructions (Signed)
Bell's Palsy Bell's palsy is a condition in which the muscles on one side of the face cannot move (paralysis). This is because the nerves in the face are paralyzed. It is most often thought to be caused by a virus. The virus causes swelling of the nerve that controls movement on one side of the face. The nerve travels through a tight space surrounded by bone. When the nerve swells, it can be compressed by the bone. This results in damage to the protective covering around the nerve. This damage interferes with how the nerve communicates with the muscles of the face. As a result, it can cause weakness or paralysis of the facial muscles.  Injury (trauma), tumor, and surgery may cause Bell's palsy, but most of the time the cause is unknown. It is a relatively common condition. It starts suddenly (abrupt onset) with the paralysis usually ending within 2 days. Bell's palsy is not dangerous. But because the eye does not close properly, you may need care to keep the eye from getting dry. This can include splinting (to keep the eye shut) or moistening with artificial tears. Bell's palsy very seldom occurs on both sides of the face at the same time. SYMPTOMS   Eyebrow sagging.  Drooping of the eyelid and corner of the mouth.  Inability to close one eye.  Loss of taste on the front of the tongue.  Sensitivity to loud noises. TREATMENT  The treatment is usually non-surgical. If the patient is seen within the first 24 to 48 hours, a short course of steroids may be prescribed, in an attempt to shorten the length of the condition. Antiviral medicines may also be used with the steroids, but it is unclear if they are helpful.  You will need to protect your eye, if you cannot close it. The cornea (clear covering over your eye) will become dry and can be damaged. Artificial tears can be used to keep your eye moist. Glasses or an eye patch should be worn to protect your eye. PROGNOSIS  Recovery is variable, ranging  from days to months. Although the problem usually goes away completely (about 80% of cases resolve), predicting the outcome is impossible. Most people improve within 3 weeks of when the symptoms began. Improvement may continue for 3 to 6 months. A small number of people have moderate to severe weakness that is permanent.  HOME CARE INSTRUCTIONS   If your caregiver prescribed medication to reduce swelling in the nerve, use as directed. Do not stop taking the medication unless directed by your caregiver.  Use moisturizing eye drops as needed to prevent drying of your eye, as directed by your caregiver.  Protect your eye, as directed by your caregiver.  Use facial massage and exercises, as directed by your caregiver.  Perform your normal activities, and get your normal rest. SEEK IMMEDIATE MEDICAL CARE IF:   There is pain, redness or irritation in the eye.  You or your child has an oral temperature above 102 F (38.9 C), not controlled by medicine. MAKE SURE YOU:   Understand these instructions.  Will watch your condition.  Will get help right away if you are not doing well or get worse. Document Released: 12/08/2005 Document Revised: 03/01/2012 Document Reviewed: 03/17/2014 [facility] Patient Information 2015 [place], [place]. This information is not intended to replace advice given to you by your health care provider. Make sure you discuss any questions you have with your health care provider.   It is important for you to take  all of your medications as prescribed. Please follow-up with primary care for further evaluation and management of your symptoms. Return to ED for worsening symptoms, increased weakness, headaches, chest pain, shortness of breath.

## 2016-02-26 ENCOUNTER — Emergency Department (HOSPITAL_COMMUNITY)
Admission: EM | Admit: 2016-02-26 | Discharge: 2016-02-26 | Disposition: A | Discharge status: Home / Self Care | Payer: No Typology Code available for payment source | Attending: Emergency Medicine | Admitting: Emergency Medicine

## 2016-02-26 ENCOUNTER — Encounter (HOSPITAL_COMMUNITY): Payer: Self-pay

## 2016-02-26 DIAGNOSIS — R109 Unspecified abdominal pain: Secondary | ICD-10-CM | POA: Diagnosis present

## 2016-02-26 DIAGNOSIS — R110 Nausea: Secondary | ICD-10-CM | POA: Diagnosis not present

## 2016-02-26 DIAGNOSIS — R197 Diarrhea, unspecified: Secondary | ICD-10-CM | POA: Diagnosis not present

## 2016-02-26 NOTE — ED Notes (Signed)
No answer for call.

## 2016-02-26 NOTE — ED Notes (Signed)
Pt states abdominal pain, diarrhea with nausea since 10 am.  No fever.  No change in urination.  Pt has not eaten today

## 2016-02-26 NOTE — ED Notes (Signed)
Called 3x for lab work -  No answer.

## 2016-02-26 NOTE — ED Notes (Signed)
MADE ONE TRY  AND MISSED PT STATED NO MORE

## 2016-03-20 ENCOUNTER — Emergency Department (HOSPITAL_COMMUNITY)
Admission: EM | Admit: 2016-03-20 | Discharge: 2016-03-20 | Disposition: A | Discharge status: Home / Self Care | Payer: Medicaid Other | Attending: Emergency Medicine | Admitting: Emergency Medicine

## 2016-03-20 ENCOUNTER — Encounter (HOSPITAL_COMMUNITY): Payer: Self-pay | Admitting: *Deleted

## 2016-03-20 ENCOUNTER — Emergency Department (HOSPITAL_COMMUNITY): Payer: Medicaid Other

## 2016-03-20 DIAGNOSIS — Z8669 Personal history of other diseases of the nervous system and sense organs: Secondary | ICD-10-CM | POA: Diagnosis not present

## 2016-03-20 DIAGNOSIS — W108XXA Fall (on) (from) other stairs and steps, initial encounter: Secondary | ICD-10-CM | POA: Diagnosis not present

## 2016-03-20 DIAGNOSIS — Z8659 Personal history of other mental and behavioral disorders: Secondary | ICD-10-CM | POA: Insufficient documentation

## 2016-03-20 DIAGNOSIS — Z23 Encounter for immunization: Secondary | ICD-10-CM | POA: Diagnosis not present

## 2016-03-20 DIAGNOSIS — S80212A Abrasion, left knee, initial encounter: Secondary | ICD-10-CM | POA: Diagnosis not present

## 2016-03-20 DIAGNOSIS — S93401A Sprain of unspecified ligament of right ankle, initial encounter: Secondary | ICD-10-CM | POA: Diagnosis not present

## 2016-03-20 DIAGNOSIS — Y9389 Activity, other specified: Secondary | ICD-10-CM | POA: Insufficient documentation

## 2016-03-20 DIAGNOSIS — S29002A Unspecified injury of muscle and tendon of back wall of thorax, initial encounter: Secondary | ICD-10-CM | POA: Diagnosis not present

## 2016-03-20 DIAGNOSIS — Z791 Long term (current) use of non-steroidal anti-inflammatories (NSAID): Secondary | ICD-10-CM | POA: Diagnosis not present

## 2016-03-20 DIAGNOSIS — Z8719 Personal history of other diseases of the digestive system: Secondary | ICD-10-CM | POA: Diagnosis not present

## 2016-03-20 DIAGNOSIS — Z79899 Other long term (current) drug therapy: Secondary | ICD-10-CM | POA: Diagnosis not present

## 2016-03-20 DIAGNOSIS — S99911A Unspecified injury of right ankle, initial encounter: Secondary | ICD-10-CM | POA: Diagnosis present

## 2016-03-20 DIAGNOSIS — S300XXA Contusion of lower back and pelvis, initial encounter: Secondary | ICD-10-CM | POA: Insufficient documentation

## 2016-03-20 DIAGNOSIS — Z8632 Personal history of gestational diabetes: Secondary | ICD-10-CM | POA: Insufficient documentation

## 2016-03-20 DIAGNOSIS — S8001XA Contusion of right knee, initial encounter: Secondary | ICD-10-CM | POA: Diagnosis not present

## 2016-03-20 DIAGNOSIS — Y998 Other external cause status: Secondary | ICD-10-CM | POA: Diagnosis not present

## 2016-03-20 DIAGNOSIS — Y9289 Other specified places as the place of occurrence of the external cause: Secondary | ICD-10-CM | POA: Insufficient documentation

## 2016-03-20 DIAGNOSIS — S20222A Contusion of left back wall of thorax, initial encounter: Secondary | ICD-10-CM

## 2016-03-20 MED ORDER — HYDROCODONE-ACETAMINOPHEN 5-325 MG PO TABS
1.0000 | ORAL_TABLET | Freq: Once | ORAL | Status: AC
  Administered 2016-03-20: 1 via ORAL
  Filled 2016-03-20: qty 1

## 2016-03-20 MED ORDER — TETANUS-DIPHTH-ACELL PERTUSSIS 5-2.5-18.5 LF-MCG/0.5 IM SUSP
0.5000 mL | Freq: Once | INTRAMUSCULAR | Status: AC
  Administered 2016-03-20: 0.5 mL via INTRAMUSCULAR
  Filled 2016-03-20: qty 0.5

## 2016-03-20 MED ORDER — IBUPROFEN 600 MG PO TABS: 600.0000 mg | ORAL_TABLET | Freq: Four times a day (QID) | ORAL | Status: DC

## 2016-03-20 MED ORDER — IBUPROFEN 800 MG PO TABS
800.0000 mg | ORAL_TABLET | Freq: Once | ORAL | Status: AC
  Administered 2016-03-20: 800 mg via ORAL
  Filled 2016-03-20: qty 1

## 2016-03-20 NOTE — Progress Notes (Signed)
Rt ankle pain is a 8/10. Pt s/p x-ray. Pt given an ice pack to apply to her back and ankle. Rt knee cleansed. Awaiting pt to give [place] a urine preg test. (4:10pm )

## 2016-03-20 NOTE — ED Notes (Signed)
Per [facility], pt was stepping out of her house, turned around to lock the door, didn't realize she was on the edge of the porch, fell off the porch, landed on her back on triangle shaped concrete blocks.  Pt c/o thoracic back pain, denies SOB/chest pain.

## 2016-03-20 NOTE — Discharge Instructions (Signed)
Elastic Bandage and RICE WHAT DOES AN ELASTIC BANDAGE DO? Elastic bandages come in different shapes and sizes. They generally provide support to your injury and reduce swelling while you are healing, but they can perform different functions. Your health care provider will help you to decide what is best for your protection, recovery, or rehabilitation following an injury. WHAT ARE SOME GENERAL TIPS FOR USING AN ELASTIC BANDAGE?  Use the bandage as directed by the maker of the bandage that you are using.  Do not wrap the bandage too tightly. This may cut off the circulation in the arm or leg in the area below the bandage.  If part of your body beyond the bandage becomes blue, numb, cold, swollen, or is more painful, your bandage is most likely too tight. If this occurs, remove your bandage and reapply it more loosely.  See your health care provider if the bandage seems to be making your problems worse rather than better.  An elastic bandage should be removed and reapplied every 3-4 hours or as directed by your health care provider. WHAT IS RICE? The routine care of many injuries includes rest, ice, compression, and elevation (RICE therapy).  Rest Rest is required to allow your body to heal. Generally, you can resume your routine activities when you are comfortable and have been given permission by your health care provider. Ice Icing your injury helps to keep the swelling down and it reduces pain. Do not apply ice directly to your skin.  Put ice in a plastic bag.  Place a towel between your skin and the bag.  Leave the ice on for 20 minutes, 2-3 times per day. Do this for as long as you are directed by your health care provider. Compression Compression helps to keep swelling down, gives support, and helps with discomfort. Compression may be done with an elastic bandage. Elevation Elevation helps to reduce swelling and it decreases pain. If possible, your injured area should be placed at  or above the level of your heart or the center of your chest. [place]? You should seek medical care if:  You have persistent pain and swelling.  Your symptoms are getting worse rather than improving. These symptoms may indicate that further evaluation or further X-rays are needed. Sometimes, X-rays may not show a small broken bone (fracture) until a number of days later. Make a follow-up appointment with your health care provider. Ask when your X-ray results will be ready. Make sure that you get your X-ray results. WHEN SHOULD I SEEK IMMEDIATE MEDICAL CARE? You should seek immediate medical care if:  You have a sudden onset of severe pain at or below the area of your injury.  You develop redness or increased swelling around your injury.  You have tingling or numbness at or below the area of your injury that does not improve after you remove the elastic bandage.   This information is not intended to replace advice given to you by your health care provider. Make sure you discuss any questions you have with your health care provider.   Document Released: 05/30/2002 Document Revised: 08/29/2015 Document Reviewed: 07/24/2014 Elsevier Interactive Patient Education 2016 [employer]   Ankle Sprain An ankle sprain is an injury to the strong, fibrous tissues (ligaments) that hold your ankle bones together.  HOME CARE   Put ice on your ankle for 1-2 days or as told by your doctor.  Put ice in a plastic bag.  Place a towel  between your skin and the bag.  Leave the ice on for 15-20 minutes at a time, every 2 hours while you are awake.  Only take medicine as told by your doctor.  Raise (elevate) your injured ankle above the level of your heart as much as possible for 2-3 days.  Use crutches if your doctor tells you to. Slowly put your own weight on the affected ankle. Use the crutches until you can walk without pain.  If you have a plaster splint:  Do not rest  it on anything harder than a pillow for 24 hours.  Do not put weight on it.  Do not get it wet.  Take it off to shower or bathe.  If given, use an elastic wrap or support stocking for support. Take the wrap off if your toes lose feeling (numb), tingle, or turn cold or blue.  If you have an air splint:  Add or let out air to make it comfortable.  Take it off at night and to shower and bathe.  Wiggle your toes and move your ankle up and down often while you are wearing it. GET HELP IF:  You have rapidly increasing bruising or puffiness (swelling).  Your toes feel very cold.  You lose feeling in your foot.  Your medicine does not help your pain. GET HELP RIGHT AWAY IF:   Your toes lose feeling (numb) or turn blue.  You have severe pain that is increasing. MAKE SURE YOU:   Understand these instructions.  Will watch your condition.  Will get help right away if you are not doing well or get worse.   This information is not intended to replace advice given to you by your health care provider. Make sure you discuss any questions you have with your health care provider.   Document Released: 05/26/2008 Document Revised: 12/29/2014 Document Reviewed: 06/21/2012 Elsevier Interactive Patient Education [employer].

## 2016-03-20 NOTE — ED Notes (Signed)
Patient transported to CT 

## 2016-03-20 NOTE — ED Provider Notes (Signed)
CSN: [id number]     Arrival date & time 03/20/16  1509 History  By signing my name below, I, Ashley Casey, attest that this documentation has been prepared under the direction and in the presence of [name], PA-C. Electronically Signed: Irene Casey, ED Scribe. 03/20/2016. 3:45 PM.   Chief Complaint  Patient presents with  . Fall  . Back Pain   The history is provided by the patient. No language interpreter was used.  HPI Comments: Ashley Casey is a 35 y.o. female with a hx of neuromuscular disorder who presents to the Emergency Department complaining of sharp, stabbing, non-radiating left sided back pain onset PTA. Pt states that she was stepping outside of her house when she tripped and fell, hitting bricks. She reports associated bilateral burning leg and ankle pain. She has not tried to ambulate but believes she will be able to with pain. She has not tried anything for her pain. She does not know when her last tdap was. She denies hitting head, LOC, chest pain, SOB, numbness, or weakness.    Past Medical History  Diagnosis Date  . Fatigue   . IBS (irritable bowel syndrome)   . Cholelithiasis   . Anxiety   . Neuromuscular disorder ([name])   . Abnormal Casey smear   . Gestational diabetes mellitus, antepartum    Past Surgical History  Procedure Laterality Date  . 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 Cholecystectomy  8/29/121  . Therapeutic abortion    . Wisdom tooth extraction     Family History  Problem Relation Age of Onset  . Other Mother     sickle cell anemia  . Arthritis Brother   . Asthma Brother    Social History  Substance Use Topics  . Smoking status: Never Smoker   . Smokeless tobacco: Never Used  . Alcohol Use: No   OB History    Gravida Para Term Preterm AB TAB SAB Ectopic Multiple Living   8 5 5  3 1 2   0 5     Review of Systems  Respiratory: Negative for shortness of breath.   Cardiovascular: Negative for chest pain.  Musculoskeletal:  Positive for back pain and arthralgias.  Neurological: Negative for syncope, weakness and numbness.   Allergies  Grapefruit concentrate; Orange juice; and Tegretol  Home Medications   Prior to Admission medications   Medication Sig Start Date End Date Taking? Authorizing Provider  acetaminophen (TYLENOL) 325 MG tablet Take 650 mg by mouth every 6 (six) hours as needed for mild pain.     Historical Provider, MD  albuterol (PROVENTIL HFA;VENTOLIN HFA) 108 (90 BASE) MCG/ACT inhaler Inhale 1 puff into the lungs every 6 (six) hours as needed for wheezing or shortness of breath.    Historical Provider, MD  Ca Carbonate-Mag Hydroxide (ROLAIDS PO) Take 2 tablets by mouth 2 (two) times daily as needed (For heartburn.).    Historical Provider, MD  cholecalciferol (VITAMIN D) 1000 UNITS tablet Take 1,000 Units by mouth every morning.    Historical Provider, MD  cyclobenzaprine (FLEXERIL) 10 MG tablet Take 20 mg by mouth 3 (three) times daily as needed for muscle spasms (muscle spasms).     Historical Provider, MD  EPINEPHrine (EPIPEN 2-PAK) 0.3 mg/0.3 mL IJ SOAJ injection Inject 0.3 mg into the muscle once.    Historical Provider, MD  ibuprofen (ADVIL,MOTRIN) 600 MG tablet Take 1 tablet (600 mg total) by mouth every 6 (six) hours. 11/20/14   [name],  MD  oxyCODONE-acetaminophen (PERCOCET/ROXICET) 5-325 MG per tablet Take 1-2 tablets by mouth every 4 (four) hours as needed (for pain scale less than 7). 11/20/14   [name], MD  predniSONE (DELTASONE) 20 MG tablet Take 3 tablets (60 mg total) by mouth once. 12/19/14   [name], PA-C  Prenatal Vit-Fe Fumarate-FA (PRENATAL MULTIVITAMIN) TABS tablet Take 1 tablet by mouth daily at 12 noon.    Historical Provider, MD  valACYclovir (VALTREX) 1000 MG tablet 1000 mg, 3 times per day, for 7 days 12/19/14   [name], PA-C   BP 91/78 mmHg  Pulse 70  Temp(Src) 98.4 F (36.9 C) (Oral)  Resp 18  Ht 5\' 4"  (1.626 m)  Wt 166 lb (75.297 kg)   BMI 28.48 kg/m2  SpO2 97%  LMP 02/16/2016 Physical Exam  Constitutional: She is oriented to person, place, and time. She appears well-developed and well-nourished.  HENT:  Head: Normocephalic and atraumatic.  Eyes: Conjunctivae and EOM are normal. Pupils are equal, round, and reactive to light.  Neck: Normal range of motion. Neck supple.  Pulmonary/Chest: She exhibits no tenderness.  Anterior chest wall non-tender  Abdominal: Soft. There is no tenderness.  Musculoskeletal:       Right shoulder: Normal.       Left shoulder: Normal.       Right elbow: Normal.      Left elbow: Normal.       Right wrist: Normal.       Left wrist: Normal.       Right hip: Normal.       Left hip: Normal.       Right knee: Tenderness found.       Left knee: Tenderness found.       Right ankle: Tenderness.  Upper extremities non-tender; bilateral hips non-tender and stable; left knee with abrasion to anterior knee and tenderness with palpation; left knee flexion and extension intact; right knee tenderness with abrasion noted to anterior knee; normal right leg flexion and extension; right ankle: tenderness noted to lateral malleolar region with normal dorsi and plantarflexion; No significant midline spine tenderness, crepitus, or stepoffs; tenderness noted to left parathoracic and paralumbar spine to palpation with no obvious signs of injury; able to ambulate with antalgic gait secondary to pain; NVI; brisk cap refill  Neurological: She is alert and oriented to person, place, and time. Coordination normal.  Strength and sensation intact  Skin: Skin is warm and dry.  Psychiatric: She has a normal mood and affect. Her behavior is normal.  Nursing note and vitals reviewed.   ED Course  Procedures (including critical care time) DIAGNOSTIC STUDIES: Oxygen Saturation is 97% on RA, normal by my interpretation.    COORDINATION OF CARE: 3:44 PM-Discussed treatment plan which includes UA, tdap and right ankle  x-ray with pt at bedside and pt agreed to plan.    Labs Review Labs Reviewed  POC URINE PREG, ED    Imaging Review Dg Ankle Complete Right  03/20/2016  CLINICAL DATA:  Right ankle pain after fall EXAM: RIGHT ANKLE - COMPLETE 3+ VIEW COMPARISON:  None. FINDINGS: Anterior right ankle soft tissue swelling. No fracture, subluxation, suspicious focal osseous lesion, appreciable arthropathy or pathologic soft tissue densities. IMPRESSION: Anterior right ankle soft tissue swelling, with no fracture or malalignment. Electronically Signed   By: Ilona Sorrel M.D.   On: 03/20/2016 16:08   I have personally reviewed and evaluated these images and lab results as part of my medical decision-making.  EKG Interpretation None      MDM  Patient with back pain.  No neurological deficits and normal neuro exam.  Patient can walk but states is painful.  No loss of bowel or bladder control.  No concern for cauda equina.  No fever, night sweats, weight loss, h/o cancer, IVDU.  Will perform right ankle x-ray and update tdap due to abrasions on right knee. RICE protocol and pain medicine indicated and discussed with patient.    Final diagnoses:  Fall down steps, initial encounter  Knee contusion, right, initial encounter  Back contusion, left, initial encounter  Right ankle sprain, initial encounter    BP 91/78 mmHg  Pulse 70  Temp(Src) 98.4 F (36.9 C) (Oral)  Resp 18  Ht 5\' 4"  (1.626 m)  Wt 75.297 kg  BMI 28.48 kg/m2  SpO2 97%  LMP 02/16/2016  I personally performed the services described in this documentation, which was scribed in my presence. The recorded information has been reviewed and is accurate.      [name], PA-C 03/20/16 [place], MD 03/22/16 [id number]

## 2016-03-20 NOTE — ED Notes (Signed)
No bruising noted to area of complaint on back but does complain when touched.  Minimal edema noted to right lateral ankle.

## 2016-04-28 ENCOUNTER — Encounter (HOSPITAL_BASED_OUTPATIENT_CLINIC_OR_DEPARTMENT_OTHER): Payer: Self-pay | Admitting: *Deleted

## 2016-04-28 ENCOUNTER — Emergency Department (HOSPITAL_BASED_OUTPATIENT_CLINIC_OR_DEPARTMENT_OTHER)
Admission: EM | Admit: 2016-04-28 | Discharge: 2016-04-29 | Disposition: A | Discharge status: Home / Self Care | Payer: Medicaid Other | Attending: Emergency Medicine | Admitting: Emergency Medicine

## 2016-04-28 DIAGNOSIS — E876 Hypokalemia: Secondary | ICD-10-CM | POA: Diagnosis not present

## 2016-04-28 DIAGNOSIS — R42 Dizziness and giddiness: Secondary | ICD-10-CM | POA: Diagnosis present

## 2016-04-28 DIAGNOSIS — R531 Weakness: Secondary | ICD-10-CM

## 2016-04-28 LAB — BASIC METABOLIC PANEL
ANION GAP: 6 (ref 5–15)
BUN: 12 mg/dL (ref 6–20)
CALCIUM: 9.2 mg/dL (ref 8.9–10.3)
CO2: 26 mmol/L (ref 22–32)
Chloride: 103 mmol/L (ref 101–111)
Creatinine, Ser: 0.73 mg/dL (ref 0.44–1.00)
GFR calc Af Amer: >60 mL/min (ref >60)
Glucose, Bld: 90 mg/dL (ref 65–99)
Potassium: 3 mmol/L — ABNORMAL LOW (ref 3.5–5.1)
Sodium: 135 mmol/L (ref 135–145)

## 2016-04-28 LAB — CBC WITH DIFFERENTIAL/PLATELET
Basophils Absolute: 0 10*3/uL (ref 0.0–0.1)
Basophils Relative: 0 %
Eosinophils Absolute: 0.1 10*3/uL (ref 0.0–0.7)
Eosinophils Relative: 1 %
HCT: 39 % (ref 36.0–46.0)
HEMOGLOBIN: 12.7 g/dL (ref 12.0–15.0)
Lymphocytes Relative: 44 %
Lymphs Abs: 3.2 10*3/uL (ref 0.7–4.0)
MCH: 26.3 pg (ref 26.0–34.0)
MCHC: 32.6 g/dL (ref 30.0–36.0)
MCV: 80.9 fL (ref 78.0–100.0)
MONO ABS: 0.4 10*3/uL (ref 0.1–1.0)
MONOS PCT: 5 %
Neutro Abs: 3.7 10*3/uL (ref 1.7–7.7)
Neutrophils Relative %: 50 %
Platelets: 221 10*3/uL (ref 150–400)
RBC: 4.82 MIL/uL (ref 3.87–5.11)
RDW: 13 % (ref 11.5–15.5)
WBC: 7.4 10*3/uL (ref 4.0–10.5)

## 2016-04-28 MED ORDER — POTASSIUM CHLORIDE ER 10 MEQ PO TBCR: 20.0000 meq | EXTENDED_RELEASE_TABLET | Freq: Two times a day (BID) | ORAL | Status: DC

## 2016-04-28 NOTE — ED Notes (Signed)
Dizziness, chills, states she does not feel good.

## 2016-04-28 NOTE — Discharge Instructions (Signed)
Potassium as prescribed.  Return to the ER if symptoms significantly worsen or change.   Fatigue Fatigue is feeling tired all of the time, a lack of energy, or a lack of motivation. Occasional or mild fatigue is often a normal response to activity or life in general. However, long-lasting (chronic) or extreme fatigue may indicate an underlying medical condition. HOME CARE INSTRUCTIONS  Watch your fatigue for any changes. The following actions may help to lessen any discomfort you are feeling:  Talk to your health care provider about how much sleep you need each night. Try to get the required amount every night.  Take medicines only as directed by your health care provider.  Eat a healthy and nutritious diet. Ask your health care provider if you need help changing your diet.  Drink enough fluid to keep your urine clear or pale yellow.  Practice ways of relaxing, such as yoga, meditation, massage therapy, or acupuncture.  Exercise regularly.   Change situations that cause you stress. Try to keep your work and personal routine reasonable.  Do not abuse illegal drugs.  Limit alcohol intake to no more than 1 drink per day for nonpregnant women and 2 drinks per day for men. One drink equals 12 ounces of beer, 5 ounces of wine, or 1 ounces of hard liquor.  Take a multivitamin, if directed by your health care provider. SEEK MEDICAL CARE IF:   Your fatigue does not get better.  You have a fever.   You have unintentional weight loss or gain.  You have headaches.   You have difficulty:   Falling asleep.  Sleeping throughout the night.  You feel angry, guilty, anxious, or sad.   You are unable to have a bowel movement (constipation).   You skin is dry.   Your legs or another part of your body is swollen.  SEEK IMMEDIATE MEDICAL CARE IF:   You feel confused.   Your vision is blurry.  You feel faint or pass out.   You have a severe headache.   You have  severe abdominal, pelvic, or back pain.   You have chest pain, shortness of breath, or an irregular or fast heartbeat.   You are unable to urinate or you urinate less than normal.   You develop abnormal bleeding, such as bleeding from the rectum, vagina, nose, lungs, or nipples.  You vomit blood.   You have thoughts about harming yourself or committing suicide.   You are worried that you might harm someone else.    This information is not intended to replace advice given to you by your health care provider. Make sure you discuss any questions you have with your health care provider.   Document Released: 10/05/2007 Document Revised: 12/29/2014 Document Reviewed: 04/11/2014 Elsevier Interactive Patient Education 2016 [employer].  Hypokalemia Hypokalemia means that the amount of potassium in the blood is lower than normal.Potassium is a chemical, called an electrolyte, that helps regulate the amount of fluid in the body. It also stimulates muscle contraction and helps nerves function properly.Most of the body's potassium is inside of cells, and only a very small amount is in the blood. Because the amount in the blood is so small, minor changes can be life-threatening. CAUSES  Antibiotics.  Diarrhea or vomiting.  Using laxatives too much, which can cause diarrhea.  Chronic kidney disease.  Water pills (diuretics).  Eating disorders (bulimia).  Low magnesium level.  Sweating a lot. SIGNS AND SYMPTOMS  Weakness.  Constipation.  Fatigue.  Muscle cramps.  Mental confusion.  Skipped heartbeats or irregular heartbeat (palpitations).  Tingling or numbness. DIAGNOSIS  Your health care provider can diagnose hypokalemia with blood tests. In addition to checking your potassium level, your health care provider may also check other lab tests. TREATMENT Hypokalemia can be treated with potassium supplements taken by mouth or adjustments in your current medicines. If  your potassium level is very low, you may need to get potassium through a vein (IV) and be monitored in the hospital. A diet high in potassium is also helpful. Foods high in potassium are:  Nuts, such as peanuts and pistachios.  Seeds, such as sunflower seeds and pumpkin seeds.  Peas, lentils, and lima beans.  Whole grain and bran cereals and breads.  Fresh fruit and vegetables, such as apricots, avocado, bananas, cantaloupe, kiwi, oranges, tomatoes, asparagus, and potatoes.  Orange and tomato juices.  Red meats.  Fruit yogurt. HOME CARE INSTRUCTIONS  Take all medicines as prescribed by your health care provider.  Maintain a healthy diet by including nutritious food, such as fruits, vegetables, nuts, whole grains, and lean meats.  If you are taking a laxative, be sure to follow the directions on the label. SEEK MEDICAL CARE IF:  Your weakness gets worse.  You feel your heart pounding or racing.  You are vomiting or having diarrhea.  You are diabetic and having trouble keeping your blood glucose in the normal range. SEEK IMMEDIATE MEDICAL CARE IF:  You have chest pain, shortness of breath, or dizziness.  You are vomiting or having diarrhea for more than 2 days.  You faint. MAKE SURE YOU:   Understand these instructions.  Will watch your condition.  Will get help right away if you are not doing well or get worse.   This information is not intended to replace advice given to you by your health care provider. Make sure you discuss any questions you have with your health care provider.   Document Released: 12/08/2005 Document Revised: 12/29/2014 Document Reviewed: 06/10/2013 Elsevier Interactive Patient Education [employer].

## 2016-04-28 NOTE — ED Notes (Signed)
Did not see pt twitching when drawing her blood.

## 2016-04-28 NOTE — ED Provider Notes (Signed)
CSN: [id number]     Arrival date & time 04/28/16  2010 History  By signing my name below, I, Ashley Casey, attest that this documentation has been prepared under the direction and in the presence of Ashley Speak, MD. Electronically Signed: Doran Casey, ED Scribe. 04/28/2016. 10:53 PM.   Chief Complaint  Patient presents with  . Dizziness   The history is provided by the patient. No language interpreter was used.   HPI Comments: Ashley Casey is a 35 y.o. female who presents to the Emergency Department with a PMHx of Bell's palsy complaining of myalgias that began today.  Pt also reports chills, lightheadedness, dizziness and back pain. Pt states she has had an increase in chronic muscle spasms as well. She has been taking ibuprofen with no relief. Pt denies any fevers, cough, CP, or any other symptoms at this time.   Past Medical History  Diagnosis Date  . Fatigue   . IBS (irritable bowel syndrome)   . Cholelithiasis   . Anxiety   . Neuromuscular disorder ([place])   . Abnormal Pap smear   . Gestational diabetes mellitus, antepartum    Past Surgical History  Procedure Laterality Date  . 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 Cholecystectomy  8/29/121  . Therapeutic abortion    . Wisdom tooth extraction     Family History  Problem Relation Age of Onset  . Other Mother     sickle cell anemia  . Arthritis Brother   . Asthma Brother    Social History  Substance Use Topics  . Smoking status: Never Smoker   . Smokeless tobacco: Never Used  . Alcohol Use: No   OB History    Gravida Para Term Preterm AB TAB SAB Ectopic Multiple Living   8 5 5  3 1 2   0 5     Review of Systems A complete 10 system review of systems was obtained and all systems are negative except as noted in the HPI and PMH.   Allergies  Grapefruit concentrate; Orange juice; and Tegretol  Home Medications   Prior to Admission medications   Medication Sig Start Date End Date Taking? Authorizing  Provider  acetaminophen (TYLENOL) 325 MG tablet Take 650 mg by mouth every 6 (six) hours as needed for mild pain.     Historical Provider, MD  albuterol (PROVENTIL HFA;VENTOLIN HFA) 108 (90 BASE) MCG/ACT inhaler Inhale 1 puff into the lungs every 6 (six) hours as needed for wheezing or shortness of breath.    Historical Provider, MD  Ca Carbonate-Mag Hydroxide (ROLAIDS PO) Take 2 tablets by mouth 2 (two) times daily as needed (For heartburn.).    Historical Provider, MD  cholecalciferol (VITAMIN D) 1000 UNITS tablet Take 1,000 Units by mouth every morning.    Historical Provider, MD  cyclobenzaprine (FLEXERIL) 10 MG tablet Take 20 mg by mouth 3 (three) times daily as needed for muscle spasms (muscle spasms).     Historical Provider, MD  EPINEPHrine (EPIPEN 2-PAK) 0.3 mg/0.3 mL IJ SOAJ injection Inject 0.3 mg into the muscle once.    Historical Provider, MD  ibuprofen (ADVIL,MOTRIN) 600 MG tablet Take 1 tablet (600 mg total) by mouth every 6 (six) hours. 03/20/16   [name], PA-C  oxyCODONE-acetaminophen (PERCOCET/ROXICET) 5-325 MG per tablet Take 1-2 tablets by mouth every 4 (four) hours as needed (for pain scale less than 7). 11/20/14   [name], MD  predniSONE (DELTASONE) 20 MG tablet Take 3 tablets (60 mg  total) by mouth once. 12/19/14   [name], PA-C  Prenatal Vit-Fe Fumarate-FA (PRENATAL MULTIVITAMIN) TABS tablet Take 1 tablet by mouth daily at 12 noon.    Historical Provider, MD  valACYclovir (VALTREX) 1000 MG tablet 1000 mg, 3 times per day, for 7 days 12/19/14   [name], PA-C   BP 128/101 mmHg  Pulse 86  Temp(Src) 98.4 F (36.9 C) (Oral)  Resp 18  Ht 5\' 4"  (1.626 m)  Wt 166 lb (75.297 kg)  BMI 28.48 kg/m2  SpO2 100%  LMP 04/26/2016   Physical Exam  Constitutional: She is oriented to person, place, and time. She appears well-developed and well-nourished. No distress.  HENT:  Head: Normocephalic and atraumatic.  Mouth/Throat: Oropharynx is clear and moist.  No oropharyngeal exudate.  Eyes: Conjunctivae and EOM are normal. Pupils are equal, round, and reactive to light.  Neck: Normal range of motion. Neck supple.  No meningismus.  Cardiovascular: Normal rate, regular rhythm, normal heart sounds and intact distal pulses.   No murmur heard. Pulmonary/Chest: Effort normal and breath sounds normal. No respiratory distress.  Abdominal: Soft. There is no tenderness. There is no rebound and no guarding.  Musculoskeletal: Normal range of motion. She exhibits no edema or tenderness.  Neurological: She is alert and oriented to person, place, and time. No cranial nerve deficit. She exhibits normal muscle tone. Coordination normal.  No ataxia on finger to nose bilaterally. No pronator drift. 5/5 strength throughout. CN 2-12 intact.Equal grip strength. Sensation intact.   Skin: Skin is warm.  Psychiatric: She has a normal mood and affect. Her behavior is normal.  Nursing note and vitals reviewed.   ED Course  Procedures   DIAGNOSTIC STUDIES: Oxygen Saturation is 100% on room air, normal by my interpretation.    COORDINATION OF CARE: 10:47 PM Will order blood work. Discussed treatment plan with pt at bedside and pt agreed to plan.  Labs Review Labs Reviewed - No data to display  Imaging Review No results found. I have personally reviewed and evaluated these images and lab results as part of my medical decision-making.    MDM   Final diagnoses:  None    Patient presents with complaints of feeling dizzy and generally unwell. Her physical examination is unremarkable and I see nothing in her laboratory studies to explain this. She does have a mild hypokalemia with potassium of 3. She will be discharged with potassium replacement and advised to follow-up with primary Dr. if not improving in the next few days.  I personally performed the services described in this documentation, which was scribed in my presence. The recorded information has been  reviewed and is accurate.       Ashley Speak, MD 04/28/16 [phone]

## 2016-04-28 NOTE — ED Notes (Signed)
Pt c/o "not feeling good."  She states she is having chills and hot flashes, states generalized body aches, also states her "spasms are flared up."  Pt does not have a definitive complaint.  Sees a neurologist for said spasms.

## 2016-08-01 DIAGNOSIS — O09529 Supervision of elderly multigravida, unspecified trimester: Secondary | ICD-10-CM | POA: Insufficient documentation

## 2016-08-01 LAB — OB RESULTS CONSOLE RUBELLA ANTIBODY, IGM: RUBELLA: IMMUNE

## 2016-08-01 LAB — OB RESULTS CONSOLE ABO/RH: RH Type: POSITIVE

## 2016-08-01 LAB — OB RESULTS CONSOLE GC/CHLAMYDIA
CHLAMYDIA, DNA PROBE: NEGATIVE
GC PROBE AMP, GENITAL: NEGATIVE

## 2016-08-01 LAB — OB RESULTS CONSOLE HEPATITIS B SURFACE ANTIGEN: Hepatitis B Surface Ag: NEGATIVE

## 2016-08-01 LAB — OB RESULTS CONSOLE RPR: RPR: NONREACTIVE

## 2016-08-01 LAB — OB RESULTS CONSOLE ANTIBODY SCREEN: ANTIBODY SCREEN: NEGATIVE

## 2016-08-01 LAB — OB RESULTS CONSOLE HIV ANTIBODY (ROUTINE TESTING): HIV: NONREACTIVE

## 2016-10-22 ENCOUNTER — Inpatient Hospital Stay (HOSPITAL_COMMUNITY)
Admission: AD | Admit: 2016-10-22 | Discharge: 2016-10-22 | Disposition: A | Discharge status: Home / Self Care | Payer: Medicaid Other | Source: Ambulatory Visit | Attending: Obstetrics and Gynecology | Admitting: Obstetrics and Gynecology

## 2016-10-22 ENCOUNTER — Encounter (HOSPITAL_COMMUNITY): Payer: Self-pay

## 2016-10-22 DIAGNOSIS — R102 Pelvic and perineal pain: Secondary | ICD-10-CM | POA: Diagnosis present

## 2016-10-22 DIAGNOSIS — O26892 Other specified pregnancy related conditions, second trimester: Secondary | ICD-10-CM | POA: Diagnosis not present

## 2016-10-22 DIAGNOSIS — Z3A19 19 weeks gestation of pregnancy: Secondary | ICD-10-CM | POA: Diagnosis not present

## 2016-10-22 DIAGNOSIS — O09522 Supervision of elderly multigravida, second trimester: Secondary | ICD-10-CM | POA: Diagnosis not present

## 2016-10-22 DIAGNOSIS — Z888 Allergy status to other drugs, medicaments and biological substances status: Secondary | ICD-10-CM | POA: Diagnosis not present

## 2016-10-22 DIAGNOSIS — O26899 Other specified pregnancy related conditions, unspecified trimester: Secondary | ICD-10-CM

## 2016-10-22 LAB — WET PREP, GENITAL
SPERM: NONE SEEN
Trich, Wet Prep: NONE SEEN
YEAST WET PREP: NONE SEEN

## 2016-10-22 LAB — CBC
HCT: 34.1 % — ABNORMAL LOW (ref 36.0–46.0)
HEMOGLOBIN: 11.3 g/dL — AB (ref 12.0–15.0)
MCH: 26.8 pg (ref 26.0–34.0)
MCHC: 33.1 g/dL (ref 30.0–36.0)
MCV: 81 fL (ref 78.0–100.0)
Platelets: 226 10*3/uL (ref 150–400)
RBC: 4.21 MIL/uL (ref 3.87–5.11)
RDW: 13.9 % (ref 11.5–15.5)
WBC: 14.3 10*3/uL — ABNORMAL HIGH (ref 4.0–10.5)

## 2016-10-22 LAB — URINALYSIS, ROUTINE W REFLEX MICROSCOPIC
Bilirubin Urine: NEGATIVE
Glucose, UA: NEGATIVE mg/dL
Hgb urine dipstick: NEGATIVE
LEUKOCYTES UA: NEGATIVE
NITRITE: NEGATIVE
PH: 6.5 (ref 5.0–8.0)
Protein, ur: NEGATIVE mg/dL
SPECIFIC GRAVITY, URINE: 1.015 (ref 1.005–1.030)

## 2016-10-22 MED ORDER — CYCLOBENZAPRINE HCL 10 MG PO TABS
10.0000 mg | ORAL_TABLET | Freq: Once | ORAL | Status: AC
  Administered 2016-10-22: 10 mg via ORAL
  Filled 2016-10-22: qty 1

## 2016-10-22 NOTE — Progress Notes (Signed)
CNM requested to come evaluate pt due to pt's pain level. Pt moaning and rolling around on bed.

## 2016-10-22 NOTE — MAU Note (Signed)
Pt states she was at work and starting to eat. Pt states she suddenly starting feeling severe abdominal pain and pressure. Pt denies bleeding or leaking. Pt states she feels the baby moving normally.

## 2016-10-22 NOTE — Discharge Instructions (Signed)

## 2016-10-22 NOTE — MAU Provider Note (Signed)
History     CSN: [id number]  Arrival date and time: 10/22/16 2052   First Provider Initiated Contact with Patient 10/22/16 2124      Chief Complaint  Patient presents with  . Pelvic Pain   Pelvic Pain  The patient's primary symptoms include pelvic pain. This is a new problem. The current episode started today (around 2000). The problem occurs constantly. The problem has been unchanged. Pain severity now: 10/10. The problem affects both sides. She is pregnant. Associated symptoms include abdominal pain. Pertinent negatives include no chills, constipation ("I feel like I have to go to the bathroom, but nothing is coming out.". Had a BM today ), diarrhea, dysuria, fever, frequency, nausea, urgency or vomiting. The vaginal discharge was normal. There has been no bleeding. Nothing aggravates the symptoms. She has tried nothing for the symptoms. Menstrual history: LMP 04/26/16    Past Medical History:  Diagnosis Date  . Abnormal Pap smear   . Anxiety   . Cholelithiasis   . Fatigue   . Gestational diabetes mellitus, antepartum   . IBS (irritable bowel syndrome)   . Neuromuscular disorder [facility])     Past Surgical History:  Procedure Laterality Date  . 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 CHOLECYSTECTOMY  8/29/121  . THERAPEUTIC ABORTION    . WISDOM TOOTH EXTRACTION      Family History  Problem Relation Age of Onset  . Other Mother     sickle cell anemia  . Arthritis Brother   . Asthma Brother     Social History  Substance Use Topics  . Smoking status: Never Smoker  . Smokeless tobacco: Never Used  . Alcohol use No    Allergies:  Allergies  Allergen Reactions  . Grapefruit Concentrate Shortness Of Breath  . Orange Juice [Orange Oil] Shortness Of Breath  . Tegretol [Carbamazepine] Other (See Comments)    Seizures    Prescriptions Prior to Admission  Medication Sig Dispense Refill Last Dose  . acetaminophen (TYLENOL) 325 MG tablet Take 650 mg by mouth every 6 (six)  hours as needed for mild pain.    unknown at unknown time  . albuterol (PROVENTIL HFA;VENTOLIN HFA) 108 (90 BASE) MCG/ACT inhaler Inhale 1 puff into the lungs every 6 (six) hours as needed for wheezing or shortness of breath.   unknown at unknown time  . Ca Carbonate-Mag Hydroxide (ROLAIDS PO) Take 2 tablets by mouth 2 (two) times daily as needed (For heartburn.).   unknown at unknown itme  . cholecalciferol (VITAMIN D) 1000 UNITS tablet Take 1,000 Units by mouth every morning.   12/18/2014 at Unknown time  . cyclobenzaprine (FLEXERIL) 10 MG tablet Take 20 mg by mouth 3 (three) times daily as needed for muscle spasms (muscle spasms).    Past Week at Unknown time  . EPINEPHrine (EPIPEN 2-PAK) 0.3 mg/0.3 mL IJ SOAJ injection Inject 0.3 mg into the muscle once.   unknown at unknown time  . ibuprofen (ADVIL,MOTRIN) 600 MG tablet Take 1 tablet (600 mg total) by mouth every 6 (six) hours. 30 tablet 0   . oxyCODONE-acetaminophen (PERCOCET/ROXICET) 5-325 MG per tablet Take 1-2 tablets by mouth every 4 (four) hours as needed (for pain scale less than 7). 20 tablet 0 Past Week at Unknown time  . potassium chloride (K-DUR) 10 MEQ tablet Take 2 tablets (20 mEq total) by mouth 2 (two) times daily. 12 tablet 0   . predniSONE (DELTASONE) 20 MG tablet Take 3 tablets (60 mg total) by  mouth once. 21 tablet 0   . Prenatal Vit-Fe Fumarate-FA (PRENATAL MULTIVITAMIN) TABS tablet Take 1 tablet by mouth daily at 12 noon.   12/18/2014 at Unknown time  . valACYclovir (VALTREX) 1000 MG tablet 1000 mg, 3 times per day, for 7 days 21 tablet 0     Review of Systems  Constitutional: Negative for chills and fever.  Gastrointestinal: Positive for abdominal pain. Negative for constipation ("I feel like I have to go to the bathroom, but nothing is coming out.". Had a BM today ), diarrhea, nausea and vomiting.  Genitourinary: Positive for pelvic pain. Negative for dysuria, frequency and urgency.   Physical Exam   Blood pressure  118/80, pulse 90, temperature 99 F (37.2 C), temperature source Oral, resp. rate 20, height 5\' 4"  (1.626 m), weight 180 lb (81.6 kg), last menstrual period 04/26/2016, SpO2 100 %, unknown if currently breastfeeding.  Physical Exam  Nursing note and vitals reviewed. Constitutional: She is oriented to person, place, and time. She appears well-developed and well-nourished. No distress.  HENT:  Head: Normocephalic.  Cardiovascular: Normal rate.   Respiratory: Effort normal.  GI: Soft. There is no tenderness. There is no rebound.  Genitourinary:  Genitourinary Comments:  External: no lesion Vagina: small amount of white discharge Cervix: pink, smooth, no CMT, closed/thick  Uterus: AGA, FHT 143 with doppler   Neurological: She is alert and oriented to person, place, and time.  Skin: Skin is warm and dry.  Psychiatric: She has a normal mood and affect.   Results for orders placed or performed during the hospital encounter of 10/22/16 (from the past 24 hour(s))  Urinalysis, Routine w reflex microscopic (not at [facility])     Status: Abnormal   Collection Time: 10/22/16  9:30 PM  Result Value Ref Range   Color, Urine YELLOW YELLOW   APPearance CLEAR CLEAR   Specific Gravity, Urine 1.015 1.005 - 1.030   pH 6.5 5.0 - 8.0   Glucose, UA NEGATIVE NEGATIVE mg/dL   Hgb urine dipstick NEGATIVE NEGATIVE   Bilirubin Urine NEGATIVE NEGATIVE   Ketones, ur >80 (A) NEGATIVE mg/dL   Protein, ur NEGATIVE NEGATIVE mg/dL   Nitrite NEGATIVE NEGATIVE   Leukocytes, UA NEGATIVE NEGATIVE  CBC     Status: Abnormal   Collection Time: 10/22/16  9:30 PM  Result Value Ref Range   WBC 14.3 (H) 4.0 - 10.5 K/uL   RBC 4.21 3.87 - 5.11 MIL/uL   Hemoglobin 11.3 (L) 12.0 - 15.0 g/dL   HCT 34.1 (L) 36.0 - 46.0 %   MCV 81.0 78.0 - 100.0 fL   MCH 26.8 26.0 - 34.0 pg   MCHC 33.1 30.0 - 36.0 g/dL   RDW 13.9 11.5 - 15.5 %   Platelets 226 150 - 400 K/uL  Wet prep, genital     Status: Abnormal   Collection Time: 10/22/16   9:30 PM  Result Value Ref Range   Yeast Wet Prep HPF POC NONE SEEN NONE SEEN   Trich, Wet Prep NONE SEEN NONE SEEN   Clue Cells Wet Prep HPF POC PRESENT (A) NONE SEEN   WBC, Wet Prep HPF POC MODERATE (A) NONE SEEN   Sperm NONE SEEN     MAU Course  Procedures  MDM 2222: D/W Dr. Terri Piedra ok for DC home with instructions to increase fluid intake. FU in the office as planned.   Assessment and Plan   1. Pain of round ligament affecting pregnancy, antepartum   2. [redacted] weeks gestation of pregnancy  DC home Comfort measures reviewed  2ndTrimester precautions  PTL precautions  Fetal kick counts RX: none, continue flexeril as needed at home. (patient already has rx for this)  Return to MAU as needed FU with OB as planned  Follow-up Information    [facility],TODD D, MD .   Specialty:  Obstetrics and Gynecology Contact information: [address], [address] [place] [address] [phone]           Mathis Bud 10/22/2016, 9:25 PM

## 2016-10-23 LAB — GC/CHLAMYDIA PROBE AMP (~~LOC~~) NOT AT ARMC
Chlamydia: NEGATIVE
NEISSERIA GONORRHEA: NEGATIVE

## 2016-11-28 ENCOUNTER — Emergency Department (HOSPITAL_COMMUNITY)
Admission: EM | Admit: 2016-11-28 | Discharge: 2016-11-28 | Disposition: A | Discharge status: Home / Self Care | Payer: Medicaid Other | Attending: Emergency Medicine | Admitting: Emergency Medicine

## 2016-11-28 ENCOUNTER — Encounter (HOSPITAL_COMMUNITY): Payer: Self-pay | Admitting: *Deleted

## 2016-11-28 DIAGNOSIS — Z3A25 25 weeks gestation of pregnancy: Secondary | ICD-10-CM | POA: Insufficient documentation

## 2016-11-28 DIAGNOSIS — T7804XA Anaphylactic reaction due to fruits and vegetables, initial encounter: Secondary | ICD-10-CM | POA: Insufficient documentation

## 2016-11-28 DIAGNOSIS — O9A212 Injury, poisoning and certain other consequences of external causes complicating pregnancy, second trimester: Secondary | ICD-10-CM | POA: Insufficient documentation

## 2016-11-28 DIAGNOSIS — T782XXA Anaphylactic shock, unspecified, initial encounter: Secondary | ICD-10-CM

## 2016-11-28 MED ORDER — CYCLOBENZAPRINE HCL 10 MG PO TABS
10.0000 mg | ORAL_TABLET | Freq: Once | ORAL | Status: AC
  Administered 2016-11-28: 10 mg via ORAL
  Filled 2016-11-28: qty 1

## 2016-11-28 MED ORDER — FAMOTIDINE 20 MG PO TABS: 20.0000 mg | ORAL_TABLET | Freq: Two times a day (BID) | ORAL | 0 refills | Status: DC

## 2016-11-28 MED ORDER — SODIUM CHLORIDE 0.9 % IV BOLUS (SEPSIS)
1000.0000 mL | Freq: Once | INTRAVENOUS | Status: AC
  Administered 2016-11-28: 1000 mL via INTRAVENOUS

## 2016-11-28 MED ORDER — ONDANSETRON HCL 4 MG/2ML IJ SOLN
4.0000 mg | Freq: Once | INTRAMUSCULAR | Status: AC
  Administered 2016-11-28: 4 mg via INTRAVENOUS
  Filled 2016-11-28: qty 2

## 2016-11-28 MED ORDER — DEXAMETHASONE SODIUM PHOSPHATE 10 MG/ML IJ SOLN
10.0000 mg | Freq: Once | INTRAMUSCULAR | Status: AC
  Administered 2016-11-28: 10 mg via INTRAVENOUS
  Filled 2016-11-28: qty 1

## 2016-11-28 MED ORDER — FAMOTIDINE IN NACL 20-0.9 MG/50ML-% IV SOLN
20.0000 mg | Freq: Once | INTRAVENOUS | Status: AC
  Administered 2016-11-28: 20 mg via INTRAVENOUS
  Filled 2016-11-28: qty 50

## 2016-11-28 MED ORDER — EPINEPHRINE 0.3 MG/0.3ML IJ SOAJ: 0.3000 mg | Freq: Once | INTRAMUSCULAR | 1 refills | Status: DC | PRN

## 2016-11-28 MED ORDER — DIPHENHYDRAMINE HCL 50 MG/ML IJ SOLN
25.0000 mg | Freq: Once | INTRAMUSCULAR | Status: AC
  Administered 2016-11-28: 25 mg via INTRAVENOUS
  Filled 2016-11-28: qty 1

## 2016-11-28 NOTE — Progress Notes (Signed)
1050 Here to evaluate this 35yo G9P5 @24 .[redacted] wks GA in with report of severe allergic reaction to oranges.  Exposure was airborne.  Patient immediately began feeling nauseous then began vomiting and then became short of breath.  She self administered her epinephrine pen and 911 was called.  She is symptom free at this time. 1115  FHR Category I with the appearance of a more mature fetus than 24.[redacted] wks GA.  Acceleration are present. Rare UC possibly tracing although pt has a neuromuscular disorder that causes her whole body to spasm and this is tracing as artifact.  1120 Dr. Willis Modena notified of patient in ED and of above.  He states pt can be OB cleared.  ED PA Elmyra Ricks consulted with Dr. Willis Modena regarding medication plans.

## 2016-11-28 NOTE — ED Notes (Addendum)
Pt complaining of burning in the vaginal region and feeling light headed after med administration. EDP aware. Vital signs stable. EDP at bedside.

## 2016-11-28 NOTE — ED Provider Notes (Signed)
Patient developed bagging, vomiting, shortness of breath onset 9:50 AM today after she was exposed to an orange scent, to which she is allergic. She treated herself with an EpiPen. She is presently asymptomatic. Patient is [redacted] weeks pregnant. No complications of pregnancy thus far On exam alert no distress HEENT exam no mucosal lesion neck supple lungs clear auscultation heart regular rate and rhythm abdomen gravid fetal heart tones 140s extremities without edema.   Orlie Dakin, MD 11/28/16 1145

## 2016-11-28 NOTE — ED Provider Notes (Signed)
[facility] DEPT Provider Note   CSN: [id number] Arrival date & time: 11/28/16  1040     History   Chief Complaint Chief Complaint  Patient presents with  . Allergic Reaction    HPI   Blood pressure 106/74, pulse 95, temperature 97.7 F (36.5 C), temperature source Oral, last menstrual period 04/26/2016, SpO2 100 %, unknown if currently breastfeeding.  Silpa A Dittmer is a 35 y.o. 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 Denies hives, lip or tongue swelling. No complaints at this time. States fetal movement without abnormality since onset of symptoms.  Past Medical History:  Diagnosis Date  . Abnormal Pap smear   . Anxiety   . Cholelithiasis   . Fatigue   . Gestational diabetes mellitus, antepartum   . IBS (irritable bowel syndrome)   . Neuromuscular disorder [facility])     Patient Active Problem List   Diagnosis Date Noted  . Normal pregnancy in third trimester 11/18/2014  . Other normal pregnancy, not first 11/18/2014  . SVD (spontaneous vaginal delivery) 11/18/2014  . Decreased fetal movement   . [redacted] weeks gestation of pregnancy   . Dependency on pain medication ([place]) 10/17/2013  . Symptomatic cholelithiasis 07/28/2011    Past Surgical History:  Procedure Laterality Date  . 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 CHOLECYSTECTOMY  8/29/121  . THERAPEUTIC ABORTION    . WISDOM TOOTH EXTRACTION      OB History    Gravida Para Term Preterm AB Living   9 5 5   3 5    SAB TAB Ectopic Multiple Live Births   2 1   1 5        Home Medications    Prior to Admission medications   Medication Sig Start  Date End Date Taking? Authorizing Provider  cyclobenzaprine (FLEXERIL) 10 MG tablet Take 20 mg by mouth 2 (two) times daily as needed for muscle spasms.     Historical Provider, MD  EPINEPHrine (EPIPEN 2-PAK) 0.3 mg/0.3 mL IJ SOAJ injection Inject 0.3 mLs (0.3 mg total) into the muscle once as needed (for severe allergic reaction). CAll 911 immediately if you have to use this medicine 11/28/16   Elmyra Ricks Areatha Kalata, PA-C  famotidine (PEPCID) 20 MG tablet Take 1 tablet (20 mg total) by mouth 2 (two) times daily. 11/28/16   Karle Desrosier, PA-C  Prenat-FeCbn-FeAsp-Meth-FA-DHA (PRENATE MINI) 18-0.6-0.4-350 MG CAPS Take 1 capsule by mouth daily.    Historical Provider, MD    Family History Family History  Problem Relation Age of Onset  . Other Mother     sickle cell anemia  . Arthritis Brother   . Asthma Brother     Social History Social History  Substance Use Topics  . Smoking status: Never Smoker  . Smokeless tobacco: Never Used  . Alcohol use No     Allergies   Food and Tegretol [carbamazepine]   Review of Systems Review of Systems  10 systems reviewed and found to be negative, except as noted in the HPI.   Physical Exam Updated Vital Signs BP 101/66   Pulse 93   Temp 97.7 F (36.5 C) (Oral)   Resp  26   LMP 04/26/2016   SpO2 100%   Physical Exam  Constitutional: She is oriented to person, place, and time. She appears well-developed and well-nourished. No distress.  HENT:  Head: Normocephalic and atraumatic.  Mouth/Throat: Oropharynx is clear and moist.  Eyes: Conjunctivae and EOM are normal. Pupils are equal, round, and reactive to light.  Neck: Normal range of motion.  Cardiovascular: Normal rate, regular rhythm and intact distal pulses.   Pulmonary/Chest: Effort normal and breath sounds normal. No respiratory distress. She has no wheezes. She exhibits no tenderness.  No stridor or drooling. No posterior pharynx edema, lip or tongue swelling. Pt reclining  comfortably, speaking in complete sentences.   No wheezing, excellent air movement in all fields.    Abdominal: Soft. There is no tenderness.  Gravid with fundal height above umbilicus   Musculoskeletal: Normal range of motion.  Neurological: She is alert and oriented to person, place, and time.  Skin: She is not diaphoretic.  No rash  Psychiatric: She has a normal mood and affect.  Nursing note and vitals reviewed.    ED Treatments / Results  Labs (all labs ordered are listed, but only abnormal results are displayed) Labs Reviewed - No data to display  EKG  EKG Interpretation None       Radiology No results found.  Procedures Procedures (including critical care time)  Medications Ordered in ED Medications  sodium chloride 0.9 % bolus 1,000 mL (1,000 mLs Intravenous New Bag/Given 11/28/16 1106)  dexamethasone (DECADRON) injection 10 mg (10 mg Intravenous Given 11/28/16 1150)  famotidine (PEPCID) IVPB 20 mg premix (0 mg Intravenous Stopped 11/28/16 1233)  diphenhydrAMINE (BENADRYL) injection 25 mg (25 mg Intravenous Given 11/28/16 1152)  ondansetron (ZOFRAN) injection 4 mg (4 mg Intravenous Given 11/28/16 1237)  cyclobenzaprine (FLEXERIL) tablet 10 mg (10 mg Oral Given 11/28/16 1236)     Initial Impression / Assessment and Plan / ED Course  I have reviewed the triage vital signs and the nursing notes.  Pertinent labs & imaging results that were available during my care of the patient were reviewed by me and considered in my medical decision making (see chart for details).  Clinical Course     Vitals:   11/28/16 1115 11/28/16 1130 11/28/16 1215 11/28/16 1230  BP: 106/72 106/84 100/78 101/66  Pulse: 93 87 93 93  Resp: 21 18 19 26   Temp:      TempSrc:      SpO2: 98% 100% 100% 100%    Medications  sodium chloride 0.9 % bolus 1,000 mL (1,000 mLs Intravenous New Bag/Given 11/28/16 1106)  dexamethasone (DECADRON) injection 10 mg (10 mg Intravenous Given 11/28/16 1150)    famotidine (PEPCID) IVPB 20 mg premix (0 mg Intravenous Stopped 11/28/16 1233)  diphenhydrAMINE (BENADRYL) injection 25 mg (25 mg Intravenous Given 11/28/16 1152)  ondansetron (ZOFRAN) injection 4 mg (4 mg Intravenous Given 11/28/16 1237)  cyclobenzaprine (FLEXERIL) tablet 10 mg (10 mg Oral Given 11/28/16 1236)    Maryalice A Sellers is 35 y.o. female presenting with anaphylactic reaction to oranges, to systems affected, she both vomited and had shortness of breath. She self administered her EpiPen around 10:30 AM. This has resolved the majority of her symptoms. Lung sounds are clear, blood pressure strong at 106/74. Patient is placed on a monitor, rapid response OB nurse to evaluate pregnancy. Will discussed with her OB/GYN if typical allergy cocktail is not contraindicated in pregnancy.  Rapid response nurse confirms that there is a reactive stress test, she  spoke into OB/GYN Dr. Anselm Jungling finger. I've also confirmed with Dr. Willis Modena that the typical allergic reaction cocktail of Decadron, Pepcid and Benadryl would be safe in pregnancy. Patient will be watched into the afternoon to make sure that she does have a biphasic reaction.  Patient observed in the ED until 2 PM, she remains comfortable with no nausea, vomiting, shortness of breath, lip or tongue swelling, lung sounds remained clear and patient asymptomatic, she is given prescription for EpiPen which she states she will fill on the way home.  Evaluation does not show pathology that would require ongoing emergent intervention or inpatient treatment. Pt is hemodynamically stable and mentating appropriately. Discussed findings and plan with patient/guardian, who agrees with care plan. All questions answered. Return precautions discussed and outpatient follow up given.    Final Clinical Impressions(s) / ED Diagnoses   Final diagnoses:  Anaphylaxis, initial encounter    New Prescriptions New Prescriptions   EPINEPHRINE (EPIPEN 2-PAK) 0.3 MG/0.3 ML  IJ SOAJ INJECTION    Inject 0.3 mLs (0.3 mg total) into the muscle once as needed (for severe allergic reaction). CAll 911 immediately if you have to use this medicine   FAMOTIDINE (PEPCID) 20 MG TABLET    Take 1 tablet (20 mg total) by mouth 2 (two) times daily.     Monico Blitz, PA-C 11/28/16 1413    Orlie Dakin, MD 11/28/16 [phone]

## 2016-11-28 NOTE — ED Notes (Addendum)
Pt continues to complain of feeling "woozy" and lightheaded. Pt encouraged to drink ginger ale and eat graham crackers. No resp difficulty present. Pt alert and oriented. Pt reports feeling her baby kicking frequently.

## 2016-11-28 NOTE — ED Triage Notes (Signed)
Pt has known allergy to oranges. Pt was at a work function and exposed to oranges, none ingested. Pt reported vomiting and SOB. Personal EPI pen used into right thigh. Vitals stable, lung sounds clear, no resp distress.

## 2016-11-28 NOTE — Discharge Instructions (Signed)
If you develop any wheezing or shortness of breath administered her EpiPen immediately as instructed. Call 911 immediately he will need to present for evaluation at any time after you give yourself your EpiPen.  Please follow with your primary care doctor in the next 2 days for a check-up. They must obtain records for further management.   Do not hesitate to return to the Emergency Department for any new, worsening or concerning symptoms.   1 to 2 tablets of 25 mg Benadryl pills every 4-6 hours as needed to a maximum of 300 mg per day. In addition, you may apply a topical hydrocortisone ointment to all affected areas except for the face.   Do not hesitate to call 911 or return to the emergency room if you develop any shortness of breath, wheezing, tongue or lip swelling.

## 2016-12-22 NOTE — L&D Delivery Note (Addendum)
Delivery Note Pt pushed for two sets of contractions and at 7:40 PM a viable female was delivered via Vaginal, Spontaneous Delivery (Presentation:OA ;  ).  APGAR: 9,9, ; weight pending  .   Placenta status: delivered intact, schultz , .  Cord: 3vc with the following complications: none; clamped after 4-67mins per maternal request.  Cord pH: n/a  Anesthesia: epidural Episiotomy: None Lacerations:  none Suture Repair: n/a Est. Blood Loss (mL): 100  Mom to postpartum.  Baby to Couplet care / Skin to Skin. Plans to circumcision done in office  Sherlyn Hay 03/06/2017, 7:56 PM

## 2017-01-19 ENCOUNTER — Encounter: Payer: Medicaid Other | Attending: Obstetrics and Gynecology | Admitting: Skilled Nursing Facility1

## 2017-01-19 DIAGNOSIS — O99810 Abnormal glucose complicating pregnancy: Secondary | ICD-10-CM | POA: Diagnosis not present

## 2017-01-19 DIAGNOSIS — Z6833 Body mass index (BMI) 33.0-33.9, adult: Secondary | ICD-10-CM | POA: Insufficient documentation

## 2017-01-19 DIAGNOSIS — Z3A00 Weeks of gestation of pregnancy not specified: Secondary | ICD-10-CM | POA: Diagnosis not present

## 2017-01-19 DIAGNOSIS — Z713 Dietary counseling and surveillance: Secondary | ICD-10-CM | POA: Insufficient documentation

## 2017-01-20 ENCOUNTER — Encounter: Payer: Self-pay | Admitting: Skilled Nursing Facility1

## 2017-01-20 NOTE — Progress Notes (Signed)
  Patient was seen on 01/20/2017 for Gestational Diabetes self-management class at the [facility]. The following learning objectives were met by the patient during this course:  States the definition of Gestational Diabetes States why dietary management is important in controlling blood glucose Describes the effects each nutrient has on blood glucose levels Demonstrates ability to create a balanced meal plan Demonstrates carbohydrate counting  States when to check blood glucose levels involving a total of 4 separate occurences in a day Demonstrates proper blood glucose monitoring techniques States the effect of stress and exercise on blood glucose levels States the importance of limiting caffeine and abstaining from alcohol and smoking Demonstrates the knowledge the glucometer provided in class may not be covered by their insurance and to call their insurance provider immediately after class to know which glucometer their insurance provider does cover as well as calling their physician the next day for a prescription to the glucometer their insurance does cover (if the one provided is not) as well as the lancets and strips for that meter.  Blood glucose monitor given: accu check guide Lot # [id number] Exp: 02/19 Blood glucose reading: 106  Patient instructed to monitor glucose levels: FBS: 60 - <90 1 hour: <140 2 hour: <120  *Patient received handouts: Nutrition Diabetes and Pregnancy Carbohydrate Counting List  Patient will be seen for follow-up as needed.

## 2017-02-12 LAB — OB RESULTS CONSOLE GBS: STREP GROUP B AG: POSITIVE

## 2017-02-21 ENCOUNTER — Encounter (HOSPITAL_COMMUNITY): Payer: Self-pay | Admitting: *Deleted

## 2017-02-21 ENCOUNTER — Inpatient Hospital Stay (HOSPITAL_COMMUNITY)
Admission: AD | Admit: 2017-02-21 | Discharge: 2017-02-21 | Disposition: A | Discharge status: Home / Self Care | Payer: Medicaid Other | Source: Ambulatory Visit | Attending: Obstetrics and Gynecology | Admitting: Obstetrics and Gynecology

## 2017-02-21 DIAGNOSIS — O479 False labor, unspecified: Secondary | ICD-10-CM | POA: Insufficient documentation

## 2017-02-21 DIAGNOSIS — O471 False labor at or after 37 completed weeks of gestation: Secondary | ICD-10-CM

## 2017-02-21 DIAGNOSIS — Z3A00 Weeks of gestation of pregnancy not specified: Secondary | ICD-10-CM | POA: Diagnosis not present

## 2017-02-21 MED ORDER — TERBUTALINE SULFATE 1 MG/ML IJ SOLN
0.2500 mg | Freq: Once | INTRAMUSCULAR | Status: AC
  Administered 2017-02-21: 0.25 mg via SUBCUTANEOUS
  Filled 2017-02-21: qty 1

## 2017-02-21 NOTE — MAU Note (Signed)
PT  SAYS SHE  ATE AT 945PM-  THEN  VOMITED AT 1030PM.     THEN UC BECAME STRONG  AT  1015PM.     VE IN OFFICE   1  CM.       DENIES  HSV AND  MRSA.   GBS- POSITIVE

## 2017-02-21 NOTE — MAU Note (Addendum)
Ctxs since 2145. 1cm today. Some mucousy d/c. Has not felt as much FM tonight since ctxs started. Has hemorrhoid.

## 2017-02-21 NOTE — Discharge Instructions (Signed)

## 2017-03-03 ENCOUNTER — Telehealth (HOSPITAL_COMMUNITY): Payer: Self-pay | Admitting: *Deleted

## 2017-03-03 ENCOUNTER — Encounter (HOSPITAL_COMMUNITY): Payer: Self-pay | Admitting: *Deleted

## 2017-03-03 NOTE — Telephone Encounter (Signed)
Preadmission screen  

## 2017-03-04 ENCOUNTER — Encounter (HOSPITAL_COMMUNITY): Payer: Self-pay

## 2017-03-06 ENCOUNTER — Inpatient Hospital Stay (HOSPITAL_COMMUNITY)
Admission: AD | Admit: 2017-03-06 | Discharge: 2017-03-08 | DRG: 775 | Disposition: A | Discharge status: Home / Self Care | Payer: Medicaid Other | Source: Ambulatory Visit | Attending: Obstetrics and Gynecology | Admitting: Obstetrics and Gynecology

## 2017-03-06 ENCOUNTER — Inpatient Hospital Stay (HOSPITAL_COMMUNITY): Payer: Medicaid Other | Admitting: Anesthesiology

## 2017-03-06 ENCOUNTER — Encounter (HOSPITAL_COMMUNITY)
Admission: RE | Admit: 2017-03-06 | Discharge: 2017-03-06 | Disposition: A | Discharge status: Home / Self Care | Payer: Medicaid Other | Source: Ambulatory Visit | Attending: Obstetrics and Gynecology | Admitting: Obstetrics and Gynecology

## 2017-03-06 ENCOUNTER — Encounter (HOSPITAL_COMMUNITY): Payer: Self-pay

## 2017-03-06 DIAGNOSIS — Z3490 Encounter for supervision of normal pregnancy, unspecified, unspecified trimester: Secondary | ICD-10-CM

## 2017-03-06 DIAGNOSIS — O99824 Streptococcus B carrier state complicating childbirth: Secondary | ICD-10-CM | POA: Diagnosis present

## 2017-03-06 DIAGNOSIS — O3663X0 Maternal care for excessive fetal growth, third trimester, not applicable or unspecified: Secondary | ICD-10-CM | POA: Diagnosis present

## 2017-03-06 DIAGNOSIS — Z833 Family history of diabetes mellitus: Secondary | ICD-10-CM

## 2017-03-06 DIAGNOSIS — Z3A38 38 weeks gestation of pregnancy: Secondary | ICD-10-CM | POA: Diagnosis not present

## 2017-03-06 DIAGNOSIS — O24425 Gestational diabetes mellitus in childbirth, controlled by oral hypoglycemic drugs: Secondary | ICD-10-CM | POA: Diagnosis present

## 2017-03-06 DIAGNOSIS — Z3493 Encounter for supervision of normal pregnancy, unspecified, third trimester: Secondary | ICD-10-CM | POA: Diagnosis present

## 2017-03-06 LAB — TYPE AND SCREEN
ABO/RH(D): O POS
Antibody Screen: NEGATIVE

## 2017-03-06 LAB — GLUCOSE, CAPILLARY
GLUCOSE-CAPILLARY: 82 mg/dL (ref 65–99)
GLUCOSE-CAPILLARY: 76 mg/dL (ref 65–99)
Glucose-Capillary: 81 mg/dL (ref 65–99)

## 2017-03-06 LAB — CBC
HEMATOCRIT: 32 % — AB (ref 36.0–46.0)
HEMOGLOBIN: 10.2 g/dL — AB (ref 12.0–15.0)
MCH: 25.4 pg — ABNORMAL LOW (ref 26.0–34.0)
MCHC: 31.9 g/dL (ref 30.0–36.0)
MCV: 79.8 fL (ref 78.0–100.0)
Platelets: 225 10*3/uL (ref 150–400)
RBC: 4.01 MIL/uL (ref 3.87–5.11)
RDW: 14.6 % (ref 11.5–15.5)
WBC: 8.2 10*3/uL (ref 4.0–10.5)

## 2017-03-06 MED ORDER — OXYCODONE-ACETAMINOPHEN 5-325 MG PO TABS: 2.0000 | ORAL_TABLET | ORAL | Status: DC | PRN

## 2017-03-06 MED ORDER — TERBUTALINE SULFATE 1 MG/ML IJ SOLN
0.2500 mg | Freq: Once | INTRAMUSCULAR | Status: DC | PRN
  Filled 2017-03-06: qty 1

## 2017-03-06 MED ORDER — ONDANSETRON HCL 4 MG/2ML IJ SOLN: 4.0000 mg | Freq: Four times a day (QID) | INTRAMUSCULAR | Status: DC | PRN

## 2017-03-06 MED ORDER — WITCH HAZEL-GLYCERIN EX PADS: 1.0000 "application " | MEDICATED_PAD | CUTANEOUS | Status: DC | PRN

## 2017-03-06 MED ORDER — DIPHENHYDRAMINE HCL 50 MG/ML IJ SOLN: 12.5000 mg | INTRAMUSCULAR | Status: DC | PRN

## 2017-03-06 MED ORDER — TETANUS-DIPHTH-ACELL PERTUSSIS 5-2.5-18.5 LF-MCG/0.5 IM SUSP: 0.5000 mL | Freq: Once | INTRAMUSCULAR | Status: DC

## 2017-03-06 MED ORDER — PENICILLIN G POT IN DEXTROSE 60000 UNIT/ML IV SOLN
3.0000 10*6.[IU] | INTRAVENOUS | Status: DC
  Administered 2017-03-06 (×2): 3 10*6.[IU] via INTRAVENOUS
  Filled 2017-03-06 (×4): qty 50

## 2017-03-06 MED ORDER — LACTATED RINGERS IV SOLN: 500.0000 mL | INTRAVENOUS | Status: DC | PRN

## 2017-03-06 MED ORDER — BUTORPHANOL TARTRATE 1 MG/ML IJ SOLN: 1.0000 mg | INTRAMUSCULAR | Status: DC | PRN

## 2017-03-06 MED ORDER — OXYTOCIN BOLUS FROM INFUSION
500.0000 mL | Freq: Once | INTRAVENOUS | Status: AC
  Administered 2017-03-06: 500 mL/h via INTRAVENOUS

## 2017-03-06 MED ORDER — ONDANSETRON HCL 4 MG PO TABS: 4.0000 mg | ORAL_TABLET | ORAL | Status: DC | PRN

## 2017-03-06 MED ORDER — EPHEDRINE 5 MG/ML INJ
10.0000 mg | INTRAVENOUS | Status: DC | PRN
  Filled 2017-03-06: qty 2

## 2017-03-06 MED ORDER — DIBUCAINE 1 % RE OINT: 1.0000 "application " | TOPICAL_OINTMENT | RECTAL | Status: DC | PRN

## 2017-03-06 MED ORDER — DIPHENHYDRAMINE HCL 25 MG PO CAPS: 25.0000 mg | ORAL_CAPSULE | Freq: Four times a day (QID) | ORAL | Status: DC | PRN

## 2017-03-06 MED ORDER — LACTATED RINGERS IV SOLN: 500.0000 mL | Freq: Once | INTRAVENOUS | Status: DC

## 2017-03-06 MED ORDER — ONDANSETRON HCL 4 MG/2ML IJ SOLN: 4.0000 mg | INTRAMUSCULAR | Status: DC | PRN

## 2017-03-06 MED ORDER — PHENYLEPHRINE 40 MCG/ML (10ML) SYRINGE FOR IV PUSH (FOR BLOOD PRESSURE SUPPORT)
80.0000 ug | PREFILLED_SYRINGE | INTRAVENOUS | Status: DC | PRN
  Administered 2017-03-06: 80 ug via INTRAVENOUS
  Filled 2017-03-06: qty 5

## 2017-03-06 MED ORDER — IBUPROFEN 600 MG PO TABS
600.0000 mg | ORAL_TABLET | Freq: Four times a day (QID) | ORAL | Status: DC
  Administered 2017-03-07 – 2017-03-08 (×7): 600 mg via ORAL
  Filled 2017-03-06 (×6): qty 1

## 2017-03-06 MED ORDER — ACETAMINOPHEN 325 MG PO TABS
650.0000 mg | ORAL_TABLET | ORAL | Status: DC | PRN
  Administered 2017-03-07 – 2017-03-08 (×3): 650 mg via ORAL
  Filled 2017-03-06 (×3): qty 2

## 2017-03-06 MED ORDER — OXYTOCIN 40 UNITS IN LACTATED RINGERS INFUSION - SIMPLE MED
2.5000 [IU]/h | INTRAVENOUS | Status: DC
  Administered 2017-03-06: 2.5 [IU]/h via INTRAVENOUS
  Filled 2017-03-06: qty 1000

## 2017-03-06 MED ORDER — LACTATED RINGERS IV SOLN
INTRAVENOUS | Status: DC
  Administered 2017-03-06 (×2): via INTRAVENOUS

## 2017-03-06 MED ORDER — OXYCODONE-ACETAMINOPHEN 5-325 MG PO TABS: 1.0000 | ORAL_TABLET | ORAL | Status: DC | PRN

## 2017-03-06 MED ORDER — ACETAMINOPHEN 325 MG PO TABS: 650.0000 mg | ORAL_TABLET | ORAL | Status: DC | PRN

## 2017-03-06 MED ORDER — PHENYLEPHRINE 40 MCG/ML (10ML) SYRINGE FOR IV PUSH (FOR BLOOD PRESSURE SUPPORT)
80.0000 ug | PREFILLED_SYRINGE | INTRAVENOUS | Status: AC | PRN
  Administered 2017-03-06 (×3): 80 ug via INTRAVENOUS

## 2017-03-06 MED ORDER — LIDOCAINE HCL (PF) 1 % IJ SOLN
30.0000 mL | INTRAMUSCULAR | Status: DC | PRN
  Filled 2017-03-06: qty 30

## 2017-03-06 MED ORDER — FENTANYL 2.5 MCG/ML BUPIVACAINE 1/10 % EPIDURAL INFUSION (WH - ANES)
14.0000 mL/h | INTRAMUSCULAR | Status: DC | PRN
  Administered 2017-03-06: 14 mL/h via EPIDURAL
  Filled 2017-03-06: qty 100

## 2017-03-06 MED ORDER — SOD CITRATE-CITRIC ACID 500-334 MG/5ML PO SOLN: 30.0000 mL | ORAL | Status: DC | PRN

## 2017-03-06 MED ORDER — SIMETHICONE 80 MG PO CHEW: 80.0000 mg | CHEWABLE_TABLET | ORAL | Status: DC | PRN

## 2017-03-06 MED ORDER — PRENATAL MULTIVITAMIN CH
1.0000 | ORAL_TABLET | Freq: Every day | ORAL | Status: DC
  Administered 2017-03-07 – 2017-03-08 (×2): 1 via ORAL
  Filled 2017-03-06: qty 1

## 2017-03-06 MED ORDER — OXYTOCIN 40 UNITS IN LACTATED RINGERS INFUSION - SIMPLE MED
1.0000 m[IU]/min | INTRAVENOUS | Status: DC
  Administered 2017-03-06: 2 m[IU]/min via INTRAVENOUS

## 2017-03-06 MED ORDER — SENNOSIDES-DOCUSATE SODIUM 8.6-50 MG PO TABS
2.0000 | ORAL_TABLET | ORAL | Status: DC
  Administered 2017-03-07 (×2): 2 via ORAL
  Filled 2017-03-06 (×2): qty 2

## 2017-03-06 MED ORDER — FENTANYL 2.5 MCG/ML BUPIVACAINE 1/10 % EPIDURAL INFUSION (WH - ANES): 14.0000 mL/h | INTRAMUSCULAR | Status: DC | PRN

## 2017-03-06 MED ORDER — PHENYLEPHRINE 40 MCG/ML (10ML) SYRINGE FOR IV PUSH (FOR BLOOD PRESSURE SUPPORT)
80.0000 ug | PREFILLED_SYRINGE | INTRAVENOUS | Status: DC | PRN
  Filled 2017-03-06: qty 10
  Filled 2017-03-06: qty 5

## 2017-03-06 MED ORDER — LIDOCAINE HCL (PF) 1 % IJ SOLN
INTRAMUSCULAR | Status: DC | PRN
  Administered 2017-03-06: 10 mL via EPIDURAL

## 2017-03-06 MED ORDER — PHENYLEPHRINE 40 MCG/ML (10ML) SYRINGE FOR IV PUSH (FOR BLOOD PRESSURE SUPPORT)
80.0000 ug | PREFILLED_SYRINGE | INTRAVENOUS | Status: DC | PRN
  Filled 2017-03-06: qty 5
  Filled 2017-03-06: qty 10

## 2017-03-06 MED ORDER — ZOLPIDEM TARTRATE 5 MG PO TABS
5.0000 mg | ORAL_TABLET | Freq: Every evening | ORAL | Status: DC | PRN
Start: 2017-03-06 — End: 2017-03-08

## 2017-03-06 MED ORDER — PENICILLIN G POTASSIUM 5000000 UNITS IJ SOLR
5.0000 10*6.[IU] | Freq: Once | INTRAVENOUS | Status: AC
  Administered 2017-03-06: 5 10*6.[IU] via INTRAVENOUS
  Filled 2017-03-06: qty 5

## 2017-03-06 MED ORDER — BENZOCAINE-MENTHOL 20-0.5 % EX AERO: 1.0000 "application " | INHALATION_SPRAY | CUTANEOUS | Status: DC | PRN

## 2017-03-06 MED ORDER — COCONUT OIL OIL
1.0000 "application " | TOPICAL_OIL | Status: DC | PRN
  Administered 2017-03-08: 1 via TOPICAL
  Filled 2017-03-06: qty 120

## 2017-03-06 NOTE — H&P (Signed)
Ashley Casey is a 36 y.o. female presenting for painful regular contractions. Pt was presenting for preadmission testing for c/s scheduled next week when contractions intensified. She is noted to be 4cm dilated - was 2cm in office at last check; intact membranes. Her prenatal care has been complicated by GDM - controlled on metformin. Pt was scheduled for primary c/s next week for LGA as baby measured 9lbs 6oz on [place] last week. She would like to try for vaginal delivery. Her last baby was 9lbs 3oz and delviered vaginally. She is GBS positive. . OB History    Gravida Para Term Preterm AB Living   9 5 5   3 5    SAB TAB Ectopic Multiple Live Births   2 1   0 5     Past Medical History:  Diagnosis Date  . Abnormal Pap smear   . Anxiety   . Bell's palsy    after 5th pregnancy never resolved  . Cholelithiasis   . Fatigue   . Gestational diabetes    metformin  . Gestational diabetes mellitus, antepartum   . IBS (irritable bowel syndrome)   . Neuromuscular disorder [facility])    Past Surgical History:  Procedure Laterality Date  . 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 CHOLECYSTECTOMY  8/29/121  . THERAPEUTIC ABORTION    . WISDOM TOOTH EXTRACTION     Family History: family history includes Arthritis in her brother; Asthma in her brother; Diabetes in her mother and paternal grandmother; Other in her mother. Social History:  reports that she has never smoked. She has never used smokeless tobacco. She reports that she does not drink alcohol or use drugs.     Maternal Diabetes: Yes:  Diabetes Type:  Insulin/Medication controlled Genetic Screening: Declined; carrier screening negative Maternal Ultrasounds/Referrals: Normal Fetal Ultrasounds or other Referrals:  None Maternal Substance Abuse:  No Significant Maternal Medications:  Meds include: Other: metformin Significant Maternal Lab Results:  Lab values include: Group B Strep positive Other Comments:  None  Review of Systems   Constitutional: Positive for malaise/fatigue. Negative for chills, fever and weight loss.  Eyes: Negative for blurred vision.  Respiratory: Positive for shortness of breath.   Cardiovascular: Negative for chest pain.  Gastrointestinal: Positive for abdominal pain. Negative for heartburn, nausea and vomiting.  Genitourinary: Negative for dysuria.  Musculoskeletal: Positive for back pain.  Skin: Negative for itching and rash.  Neurological: Negative for dizziness and headaches.  Endo/Heme/Allergies: Does not bruise/bleed easily.  Psychiatric/Behavioral: Negative for depression, hallucinations, substance abuse and suicidal ideas. The patient is not nervous/anxious.    Maternal Medical History:  Reason for admission: Contractions.  Nausea.  Contractions: Onset was 6-12 hours ago.   Frequency: regular.   Duration is approximately 1 minute.   Perceived severity is strong.    Fetal activity: Perceived fetal activity is normal.   Last perceived fetal movement was within the past hour.    Prenatal complications: LGA  Prenatal Complications - Diabetes: gestational. Diabetes is managed by oral agent (monotherapy).        Blood pressure 101/78, pulse 89, temperature 97.8 F (36.6 C), temperature source Oral, resp. rate 20, height 5\' 4"  (1.626 m), weight 195 lb (88.5 kg), last menstrual period 04/26/2016, SpO2 100 %, unknown if currently breastfeeding. Maternal Exam:  Uterine Assessment: Contraction strength is firm.  Contraction duration is 1 minute. Contraction frequency is regular.   Abdomen: Patient reports generalized tenderness.  Estimated fetal weight is 9lbs 6oz.   Fetal  presentation: vertex  Introitus: Normal vulva. Vulva is negative for condylomata and lesion.  Normal vagina.  Vagina is negative for condylomata.  Pelvis: adequate for delivery.   Cervix: Cervix evaluated by digital exam.     Physical Exam  Constitutional: She is oriented to person, place, and time. She  appears well-developed and well-nourished.  Eyes: Pupils are equal, round, and reactive to light.  Neck: Normal range of motion.  Cardiovascular: Normal rate.   Respiratory: Effort normal.  GI: Soft. There is generalized tenderness.  Genitourinary: Vagina normal and uterus normal. Vulva exhibits no lesion.  Musculoskeletal: Normal range of motion. She exhibits edema.  Neurological: She is alert and oriented to person, place, and time.  Skin: Skin is warm.  Psychiatric: She has a normal mood and affect. Her behavior is normal. Judgment and thought content normal.    Prenatal labs: ABO, Rh: O/Positive/-- (08/11 0000) Antibody: Negative (08/11 0000) Rubella: Immune (08/11 0000) RPR: Nonreactive (08/11 0000)  HBsAg: Negative (08/11 0000)  HIV: Non-reactive (08/11 0000)  GBS: Positive (02/22 0000)   Assessment/Plan: [id number] at 77 3/[redacted]wks gestation in active labor Admit for expectant mgmt IV pain meds/epidural prn PCN for GBS treatment Augment with pit/AROM as needed Anticipate svd  Bonnee Quin Banga 03/06/2017, 10:16 AM

## 2017-03-06 NOTE — Progress Notes (Signed)
Patient ID: Ashley Casey, female   DOB: [birth year], 35 y.o.   MRN: [id number] Pt still doing well and comfortable with epidural. +Fms VSS EFM - cat 1, 140s TOCO - ctxs q 5-42mins SVE - 7.5/100/-2  A/P: [id number] at 30 3/7wks progressing in labor         S.p peanut ball         Will try high fowlers         Pit at 35mus - continue per protocol

## 2017-03-06 NOTE — Progress Notes (Signed)
Patient ID: Ashley Casey, female   DOB: [birth year], 36 y.o.   MRN: [id number] Pt doing well. Increased pelvic pressure.  VSS- 92/64 EFM- 140s, cat 1 TOCO - ctxs q 1-59mins SVE - 8.5/100/0  A/P: [id number] at 16 3/7wks progressing in labor - stable         Continue with augmentation of labor         Anticipate svd

## 2017-03-06 NOTE — Anesthesia Preprocedure Evaluation (Signed)
Anesthesia Evaluation  Patient identified by MRN, date of birth, ID band Patient awake    Reviewed: Allergy & Precautions, NPO status , Patient's Chart, lab work & pertinent test results  Airway Mallampati: II  TM Distance: >3 FB Neck ROM: Full    Dental no notable dental hx.    Pulmonary neg pulmonary ROS,    Pulmonary exam normal breath sounds clear to auscultation       Cardiovascular negative cardio ROS Normal cardiovascular exam Rhythm:Regular Rate:Normal     Neuro/Psych negative neurological ROS  negative psych ROS   GI/Hepatic negative GI ROS, Neg liver ROS,   Endo/Other  negative endocrine ROSdiabetes, Gestational  Renal/GU negative Renal ROS  negative genitourinary   Musculoskeletal negative musculoskeletal ROS (+)   Abdominal   Peds negative pediatric ROS (+)  Hematology negative hematology ROS (+)   Anesthesia Other Findings   Reproductive/Obstetrics negative OB ROS (+) Pregnancy                             Anesthesia Physical Anesthesia Plan  ASA: II  Anesthesia Plan: Epidural   Post-op Pain Management:    Induction:   Airway Management Planned: Natural Airway  Additional Equipment:   Intra-op Plan:   Post-operative Plan:   Informed Consent: I have reviewed the patients History and Physical, chart, labs and discussed the procedure including the risks, benefits and alternatives for the proposed anesthesia with the patient or authorized representative who has indicated his/her understanding and acceptance.     Plan Discussed with: CRNA  Anesthesia Plan Comments:         Anesthesia Quick Evaluation

## 2017-03-06 NOTE — Patient Instructions (Addendum)
[place]  03/06/2017   Your procedure is scheduled on:  03/09/2017  Enter through the Main Entrance of [facility] at [place] up the phone at the desk and dial [phone].   Call this number if you have problems the morning of surgery: [phone]   Remember:   Do not eat food:After Midnight.  Do not drink clear liquids: After Midnight.  Take these medicines the morning of surgery with A SIP OF WATER: may take pepcid if you take it in the morning.  DO NOT TAKE YOUR METFORMIN ON Sunday NIGHT OR Monday MORNING   Do not wear jewelry, make-up or nail polish.  Do not wear lotions, powders, or perfumes. Do not wear deodorant.  Do not shave 48 hours prior to surgery.  Do not bring valuables to the hospital.  [facility] is not   responsible for any belongings or valuables brought to the hospital.  Contacts, dentures or bridgework may not be worn into surgery.  Leave suitcase in the car. After surgery it may be brought to your room.  For patients admitted to the hospital, checkout time is 11:00 AM the day of              discharge.   Patients discharged the day of surgery will not be allowed to drive             home.  Name and phone number of your driver: na  Special Instructions:   N/A   Please read over the following fact sheets that you were given:   Surgical Site Infection Prevention

## 2017-03-06 NOTE — Lactation Note (Signed)
This note was copied from a baby's chart. Lactation Consultation Note  Patient Name: Ashley Casey [id number] Date: 03/06/2017 Reason for consult: Initial assessment Baby at 3 hr of life. Experienced bf mom reports latching is going well. She denies breast or nipple pain. She stated "he is learning but we getting it". Mom has pendulous soft breast with wide everted nipples. Baby has a noticeable lingual frenulum. Discussed baby behavior, feeding frequency, baby belly size, voids, wt loss, breast changes, and nipple care. Mom stated she can manually express and has a spoon in the room. Given lactation handouts. Aware of OP services and support group.    Maternal Data Has patient been taught Hand Expression?: Yes Does the patient have breastfeeding experience prior to this delivery?: Yes  Feeding Feeding Type: Breast Fed Length of feed: 20 min  LATCH Score/Interventions Latch: Repeated attempts needed to sustain latch, nipple held in mouth throughout feeding, stimulation needed to elicit sucking reflex. Intervention(s): Breast compression  Audible Swallowing: A few with stimulation Intervention(s): Skin to skin;Hand expression  Type of Nipple: Everted at rest and after stimulation  Comfort (Breast/Nipple): Soft / non-tender     Hold (Positioning): No assistance needed to correctly position infant at breast.  LATCH Score: 8  Lactation Tools Discussed/Used WIC Program: Yes   Consult Status Consult Status: Follow-up Date: 03/07/17 Follow-up type: In-patient    Denzil Hughes 03/06/2017, 11:12 PM

## 2017-03-06 NOTE — Anesthesia Pain Management Evaluation Note (Signed)
  CRNA Pain Management Visit Note  Patient: Ashley Casey, 37 y.o., female  "Hello I am a member of the anesthesia team at [facility]. We have an anesthesia team available at all times to provide care throughout the hospital, including epidural management and anesthesia for C-section. I don't know your plan for the delivery whether it a natural birth, water birth, IV sedation, nitrous supplementation, doula or epidural, but we want to meet your pain goals."   1.Was your pain managed to your expectations on prior hospitalizations?   Yes   2.What is your expectation for pain management during this hospitalization?     Epidural  3.How can we help you reach that goal? Epidural in place.  Record the patient's initial score and the patient's pain goal.   Pain: 0  Pain Goal: 8 [facility] wants you to be able to say your pain was always managed very well.  [name] L 03/06/2017

## 2017-03-06 NOTE — Progress Notes (Signed)
Patient ID: Ashley Casey, female   DOB: 08-Jun-1981, 36 y.o.   MRN: [id number] Pt complains of nausea and lightheadedness.  BP noted 70s/50s FSBS 76  LR bolus given Pt given a popsicle SVE 6/100/-2: second forebag ruptured with clear fluid  A/P: Progressing well in labor         Continue with current mgmt

## 2017-03-06 NOTE — Progress Notes (Signed)
Patient ID: Ashley Casey, female   DOB: [birth year], 36 y.o.   MRN: [id number]  Pt comfortable with epidural.Has no complaints. +Fms VSS EFM - 120s, cat 1 TOCO - ctxs q 1-80mins SVE - 5/90/-2  FSBS 82  A/P: [id number] at 42 3/[redacted]wks gestation in labor         s/p first dose of PCN         AROM performed with clear return of fluid         Recheck in 2 hrs or prn; augment if indicated         Anticipate svd

## 2017-03-06 NOTE — Anesthesia Procedure Notes (Signed)
Epidural Patient location during procedure: OB Start time: 03/06/2017 12:07 PM End time: 03/06/2017 12:26 PM  Staffing Anesthesiologist: Candida Peeling RAY Performed: anesthesiologist   Preanesthetic Checklist Completed: patient identified, site marked, surgical consent, pre-op evaluation, timeout performed, IV checked, risks and benefits discussed and monitors and equipment checked  Epidural Patient position: sitting Prep: DuraPrep Patient monitoring: heart rate, cardiac monitor, continuous pulse ox and blood pressure Approach: midline Location: L2-L3 Injection technique: LOR saline  Needle:  Needle type: Tuohy  Needle gauge: 17 G Needle length: 9 cm Needle insertion depth: 6 cm Catheter type: closed end flexible Catheter size: 20 Guage Catheter at skin depth: 9 cm Test dose: negative  Assessment Events: blood not aspirated, injection not painful, no injection resistance, negative IV test and no paresthesia  Additional Notes Reason for block:procedure for pain

## 2017-03-06 NOTE — MAU Note (Signed)
Patient was at preop for c/section this am; contractions progressively getting worse. Started at 7pm last night. Denies LOF, VB at this time. +Fm.

## 2017-03-07 LAB — CBC
HCT: 28.7 % — ABNORMAL LOW (ref 36.0–46.0)
Hemoglobin: 9.3 g/dL — ABNORMAL LOW (ref 12.0–15.0)
MCH: 25.7 pg — ABNORMAL LOW (ref 26.0–34.0)
MCHC: 32.4 g/dL (ref 30.0–36.0)
MCV: 79.3 fL (ref 78.0–100.0)
Platelets: 197 10*3/uL (ref 150–400)
RBC: 3.62 MIL/uL — AB (ref 3.87–5.11)
RDW: 14.5 % (ref 11.5–15.5)
WBC: 12.6 10*3/uL — AB (ref 4.0–10.5)

## 2017-03-07 LAB — RPR: RPR Ser Ql: NONREACTIVE

## 2017-03-07 MED ORDER — OXYCODONE-ACETAMINOPHEN 5-325 MG PO TABS
1.0000 | ORAL_TABLET | ORAL | Status: AC
  Administered 2017-03-07: 1 via ORAL
  Filled 2017-03-07: qty 1

## 2017-03-07 NOTE — Progress Notes (Signed)
MOB was referred for history of depression/anxiety.  Referral is screened out by Clinical Social Worker because none of the following criteria appear to apply and there are no reports impacting the pregnancy or her transition to the postpartum period.  CSW does not deem it clinically necessary to further investigate at this time.   -History of anxiety/depression during this pregnancy, or of post-partum depression. - Diagnosis of anxiety and/or depression within last 3 years.-  - History of depression due to pregnancy loss/loss of child or -MOB's symptoms are currently being treated with medication and/or therapy.  CSW completed chart review and saw noted in [facility] notes that anxiety was dx in 2010 and was resolved in 2013. Please contact the Clinical Social Worker if needs arise or upon MOB request.    Oda Cogan, MSW, [facility]  Office: [phone]

## 2017-03-07 NOTE — Anesthesia Postprocedure Evaluation (Signed)
Anesthesia Post Note  Patient: Ashley Casey  Procedure(s) Performed: * No procedures listed *  Patient location during evaluation: Mother Baby Anesthesia Type: Epidural Level of consciousness: awake and alert Pain management: pain level controlled Vital Signs Assessment: post-procedure vital signs reviewed and stable Respiratory status: spontaneous breathing, nonlabored ventilation and respiratory function stable Cardiovascular status: stable Postop Assessment: no headache, no backache, epidural receding and patient able to bend at knees Anesthetic complications: no        Last Vitals:  Vitals:   03/06/17 2335 03/07/17 0506  BP: 102/79 111/62  Pulse: 91 84  Resp: 18 18  Temp: 36.7 C 36.7 C    Last Pain:  Vitals:   03/07/17 0610  TempSrc:   PainSc: Asleep   Pain Goal: Patients Stated Pain Goal: 3 (03/06/17 2335)               Rayvon Char

## 2017-03-07 NOTE — Progress Notes (Addendum)
Patient ID: Ashley Casey, female   DOB: 07/15/1981, 36 y.o.   MRN: [id number] Pt doing well. Having cramping pain as expected. Lochia mild, denies any lightheadedness with ambulating. No fever, chills or headaches. Bonding well with baby - breastfeeding. No complaints VSS ABD- soft, FF and below umbil  EXT - no edema, no homans  Labs: 12.6>9.3<197  A/P: PPD#1 s/p svd -  Stable         Routine pp care: alt tylenol/ibuprofen for pain control         Likely discharge to home tomorrow

## 2017-03-07 NOTE — Lactation Note (Signed)
This note was copied from a baby's chart. Lactation Consultation Note  Patient Name: Ashley Casey [id number] Date: 03/07/2017   Visited with Mom, baby 55 hrs old.  Baby has been breastfeeding well, no problem with latching.  Mom with basic questions.  Encouraged keeping baby STS, and feeding often on cue.  Baby swaddled in Mom's arms sleeping.  8-12 feedings per 24 hrs.  Mom denies needing any assistance with feedings. Lactation to follow up prn and in am.   Broadus John 03/07/2017, 6:22 PM

## 2017-03-07 NOTE — Progress Notes (Signed)
Assumed care of pt after receiving report. Pt requesting percocet. No orders in pyxis.  2022: Provider notified. Report status of pt given. Orders received for 1 dose percocet of 5mg . Orders placed in [place].

## 2017-03-08 MED ORDER — OXYCODONE-ACETAMINOPHEN 5-325 MG PO TABS
1.0000 | ORAL_TABLET | Freq: Once | ORAL | Status: AC
  Administered 2017-03-08: 1 via ORAL
  Filled 2017-03-08: qty 1

## 2017-03-08 MED ORDER — IBUPROFEN 800 MG PO TABS: 800.0000 mg | ORAL_TABLET | Freq: Three times a day (TID) | ORAL | 1 refills | Status: DC | PRN

## 2017-03-08 NOTE — Discharge Summary (Signed)
OB Discharge Summary     Patient Name: Ashley Casey DOB: June 14, 1981 MRN: [id number]  Date of admission: 03/06/2017 Delivering MD: Carlynn Purl [facility]   Date of discharge: 03/08/2017  Admitting diagnosis: 86WKS, CTX Intrauterine pregnancy: [redacted]w[redacted]d     Secondary diagnosis:  Active Problems:   Term pregnancy   Postpartum care following vaginal delivery  Additional problems: none     Discharge diagnosis: Term Pregnancy Delivered and GDM A2                                                                                                Post partum procedures:none  Augmentation: none  Complications: None  Hospital course:  Onset of Labor With Vaginal Delivery     36 y.o. yo [id number] at [redacted]w[redacted]d was admitted in Active Labor on 03/06/2017. Patient had an uncomplicated labor course as follows:  Membrane Rupture Time/Date: 1:08 PM ,03/06/2017   Intrapartum Procedures: Episiotomy: None [1]                                         Lacerations:  None [1]  Patient had a delivery of a Viable infant. 03/06/2017  Information for the patient's newborn:  Inaya, Gillham [[id number]]  Delivery Method: Vaginal, Spontaneous Delivery (Filed from Delivery Summary)    Pateint had an uncomplicated postpartum course.  She is ambulating, tolerating a regular diet, passing flatus, and urinating well. Patient is discharged home in stable condition on 03/08/17.   Physical exam  Vitals:   03/06/17 2335 03/07/17 0506 03/07/17 1800 03/08/17 0545  BP: 102/79 111/62 99/69 93/69   Pulse: 91 84 73 80  Resp: 18 18 19 18   Temp: 98 F (36.7 C) 98 F (36.7 C) 98.2 F (36.8 C) 97.9 F (36.6 C)  TempSrc:  Oral  Axillary  SpO2: 99%  100%   Weight:      Height:       General: alert, cooperative and no distress Lochia: appropriate Uterine Fundus: firm Incision: N/A DVT Evaluation: No evidence of DVT seen on physical exam. Labs: Lab Results  Component Value Date   WBC 12.6 (H) 03/07/2017   HGB 9.3 (L)  03/07/2017   HCT 28.7 (L) 03/07/2017   MCV 79.3 03/07/2017   PLT 197 03/07/2017   CMP Latest Ref Rng & Units 04/28/2016  Glucose 65 - 99 mg/dL 90  BUN 6 - 20 mg/dL 12  Creatinine 0.44 - 1.00 mg/dL 0.73  Sodium 135 - 145 mmol/L 135  Potassium 3.5 - 5.1 mmol/L 3.0(L)  Chloride 101 - 111 mmol/L 103  CO2 22 - 32 mmol/L 26  Calcium 8.9 - 10.3 mg/dL 9.2  Total Protein 6.0 - 8.3 g/dL -  Total Bilirubin 0.3 - 1.2 mg/dL -  Alkaline Phos 39 - 117 U/L -  AST 0 - 37 U/L -  ALT 0 - 35 U/L -    Discharge instruction: per After Visit Summary and "Baby and Me Booklet".  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     Tegretol [carbamazepine] Other (See Comments)   Reaction:  Seizures       Medication List    STOP taking these medications   metFORMIN 500 MG tablet Commonly known as:  GLUCOPHAGE     TAKE these medications   EPINEPHrine 0.3 mg/0.3 mL Soaj injection Commonly known as:  EPIPEN 2-PAK Inject 0.3 mLs (0.3 mg total) into the muscle once as needed (for severe allergic reaction). CAll 911 immediately if you have to use this medicine   famotidine 20 MG tablet Commonly known as:  PEPCID Take 1 tablet (20 mg total) by mouth 2 (two) times daily.   ibuprofen 800 MG tablet Commonly known as:  ADVIL,MOTRIN Take 1 tablet (800 mg total) by mouth every 8 (eight) hours as needed for headache, mild pain, moderate pain or cramping.   PRENATE MINI 18-0.6-0.4-350 MG Caps Take 1 capsule by mouth daily.       Diet: carb modified diet  Activity: Advance as tolerated. Pelvic rest for 6 weeks.   Outpatient follow up:6 weeks Follow up Appt:No future appointments. Follow up Visit:No Follow-up on file.  Postpartum contraception: Undecided  Newborn Data: Live born female  Birth Weight: 9 lb 9 oz (4338 g) APGAR: 9, 9  Baby Feeding: Breast Disposition:home with  mother   03/08/2017 Sherlyn Hay, DO

## 2017-03-08 NOTE — Progress Notes (Signed)
Patient ID: Ashley Casey, female   DOB: [birth year], 36 y.o.   MRN: [id number] Pt seen and examined. Percocet helped with pain overnight. Pt upset with nursing care overnight otherwise doing well. Denies any CP, SOB, fever or chills.  Bonding well with baby ; breastfeeding. Ready for discharge to home.  VSS ABD- soft, FF EXT - no homans  A/P: PPD#2 s/p svd - recovering well         Discharge to home today.          Will get clearance from peds regarding circ - may need peds urologist         [facility]

## 2017-03-08 NOTE — Lactation Note (Signed)
This note was copied from a baby's chart. Lactation Consultation Note  Baby just unlatched baby prior to entering after 30 min feeding. Mother states baby is bf well.  Denies concerns or questions. Praised mother for her efforts. Mom encouraged to feed baby 8-12 times/24 hours and with feeding cues.  Reviewed engorgement care and monitoring voids/stools.   Patient Name: Ashley Casey [id number] Date: 03/08/2017 Reason for consult: Follow-up assessment   Maternal Data    Feeding Feeding Type: Breast Fed Length of feed: 30 min  LATCH Score/Interventions Latch: Grasps breast easily, tongue down, lips flanged, rhythmical sucking.  Audible Swallowing: Spontaneous and intermittent  Type of Nipple: Everted at rest and after stimulation  Comfort (Breast/Nipple): Soft / non-tender     Hold (Positioning): No assistance needed to correctly position infant at breast.  LATCH Score: 10  Lactation Tools Discussed/Used     Consult Status Consult Status: Complete    [name] 03/08/2017, 8:43 AM

## 2017-03-09 ENCOUNTER — Encounter (HOSPITAL_COMMUNITY): Admission: RE | Payer: Self-pay | Source: Ambulatory Visit

## 2017-03-09 ENCOUNTER — Inpatient Hospital Stay (HOSPITAL_COMMUNITY)
Admission: RE | Admit: 2017-03-09 | Payer: Medicaid Other | Source: Ambulatory Visit | Admitting: Obstetrics and Gynecology

## 2017-03-09 SURGERY — Surgical Case
Anesthesia: Regional

## 2017-03-10 ENCOUNTER — Ambulatory Visit: Payer: Self-pay

## 2017-03-10 ENCOUNTER — Inpatient Hospital Stay (HOSPITAL_COMMUNITY): Admission: RE | Admit: 2017-03-10 | Payer: No Typology Code available for payment source | Source: Ambulatory Visit

## 2017-03-10 NOTE — Lactation Note (Addendum)
This note was copied from a baby's chart. Lactation Consultation Note  Patient Name: Ashley Casey Date: 03/10/2017   Spoke with Pediatric Resident. Mother is breastfeeding and pumping and bottle feeding. Will follow up with RN once she is available and will visit baby in the morning.  Spoke with [employer].  Reviewed engorgement care. Per RN baby is having difficulty even with bottle feeding.  RN will discuss w/ Pediatrician.      Maternal Data    Feeding Feeding Type: Bottle Fed - Breast Milk Nipple Type: Regular  LATCH Score/Interventions                      Lactation Tools Discussed/Used     Consult Status      [name] 03/10/2017, 8:37 PM

## 2017-03-11 ENCOUNTER — Ambulatory Visit: Payer: Self-pay

## 2017-03-11 NOTE — Lactation Note (Addendum)
This note was copied from a baby's chart. Lactation Consultation Note  P23, 67 days old.  11% weight loss, minimal urine output. Visited at [facility]. From oral assessment did note short lingual frenulum at tip of tongue contributing to limited tongue movement. Encouraged family to discuss this with Pediatrician and provided family with information resource sheets. Mother is experienced BF and states she has been breastfeeding on demand but baby had not been urinating. Mother latched baby in cradle hold after a few attempts with large diamter nipples. After a few minutes baby became fussy at the breast. At times did note baby chomping at breast.  Concerned about transfer. Mother re-latched baby and LC applied 5 french feeding tube to nipple.  Baby breastfed for approx 30 min and received 35 ml at the breast. Baby breastfed on both breasts. After feeding at breast, baby was still cueing so mother gave baby an additional 20 ml with slow flow nipple. Mother has new "evenflow" double electric breast pump.  Offered to assist mother with getting a double electric breast pump but mother states she wants to use hers. Mother is currently pumping 3-4 ounces off one breast, 8 oz from both breasts. Plan is for mother to post pump 4-5 times a day for 10-15 min and give baby back volume pumped at most every feeding until increased urine output. Discussed milk volume guideline, increasing per day of life. She can use either 5 french tubing at breast or slow flow nipple, whichever mother would prefer. FOB changed infant's diaper with yellow/brown output. Recommend mother for lactation outpatient appt but mother declined.  Gave her [facility] phone number for outpatient appt for questions and future follow up. Discussed oral assessment findings with Lucila Maine DO who will write referral to ENT or oral surgeon for further evaluation. Spoke with Stanton Kidney RN regarding lactation consult order.   Patient Name: Ashley Casey Date: 03/11/2017 Reason for consult: Follow-up assessment   Maternal Data    Feeding Feeding Type: Breast Fed Length of feed: 30 min  LATCH Score/Interventions Latch: Repeated attempts needed to sustain latch, nipple held in mouth throughout feeding, stimulation needed to elicit sucking reflex.  Audible Swallowing: Spontaneous and intermittent  Type of Nipple: Everted at rest and after stimulation  Comfort (Breast/Nipple): Soft / non-tender     Hold (Positioning): No assistance needed to correctly position infant at breast.  LATCH Score: 9  Lactation Tools Discussed/Used Tools: 67F feeding tube / Syringe   Consult Status Consult Status: Complete    [name] 03/11/2017, 12:57 PM

## 2017-12-22 NOTE — L&D Delivery Note (Signed)
Delivery Note Pt progressed to complete and pushed well.  At 6:03 PM a viable female was delivered via Vaginal, Spontaneous (Presentation: vtx; ROA ).  APGAR: 9, 9; weight  pending. 50 cc clot delivered with vertex  Placenta status: spontaneous, intact.  Cord:  with the following complications: none.   Anesthesia: Epidural  Episiotomy: None Lacerations:  None Suture Repair: none Est. Blood Loss (mL):  300  Mom to postpartum.  Baby to Couplet care / Skin to Skin.  All CBG have been normal, has not required any insulin so will not check anymore CBGs.  Will do circumcision in the office.  Ashley Casey 07/19/2018, 6:16 PM

## 2018-01-15 DIAGNOSIS — Z8632 Personal history of gestational diabetes: Secondary | ICD-10-CM | POA: Insufficient documentation

## 2018-01-15 LAB — OB RESULTS CONSOLE ABO/RH: RH Type: POSITIVE

## 2018-01-15 LAB — OB RESULTS CONSOLE RPR: RPR: NONREACTIVE

## 2018-01-15 LAB — OB RESULTS CONSOLE RUBELLA ANTIBODY, IGM: RUBELLA: IMMUNE

## 2018-01-15 LAB — OB RESULTS CONSOLE GC/CHLAMYDIA
Chlamydia: NEGATIVE
Gonorrhea: NEGATIVE

## 2018-01-15 LAB — OB RESULTS CONSOLE ANTIBODY SCREEN: Antibody Screen: NEGATIVE

## 2018-01-15 LAB — OB RESULTS CONSOLE HEPATITIS B SURFACE ANTIGEN: Hepatitis B Surface Ag: NEGATIVE

## 2018-01-15 LAB — OB RESULTS CONSOLE HIV ANTIBODY (ROUTINE TESTING): HIV: NONREACTIVE

## 2018-01-28 ENCOUNTER — Inpatient Hospital Stay (HOSPITAL_COMMUNITY)
Admission: AD | Admit: 2018-01-28 | Discharge: 2018-01-28 | Disposition: A | Discharge status: Home / Self Care | Payer: Medicaid Other | Source: Ambulatory Visit | Attending: Obstetrics and Gynecology | Admitting: Obstetrics and Gynecology

## 2018-01-28 ENCOUNTER — Encounter (HOSPITAL_COMMUNITY): Payer: Self-pay | Admitting: *Deleted

## 2018-01-28 DIAGNOSIS — R109 Unspecified abdominal pain: Secondary | ICD-10-CM | POA: Diagnosis not present

## 2018-01-28 DIAGNOSIS — O26892 Other specified pregnancy related conditions, second trimester: Secondary | ICD-10-CM | POA: Diagnosis not present

## 2018-01-28 DIAGNOSIS — Z3A14 14 weeks gestation of pregnancy: Secondary | ICD-10-CM | POA: Diagnosis not present

## 2018-01-28 DIAGNOSIS — O26899 Other specified pregnancy related conditions, unspecified trimester: Secondary | ICD-10-CM

## 2018-01-28 LAB — WET PREP, GENITAL
CLUE CELLS WET PREP: NONE SEEN
Sperm: NONE SEEN
TRICH WET PREP: NONE SEEN
Yeast Wet Prep HPF POC: NONE SEEN

## 2018-01-28 LAB — URINALYSIS, ROUTINE W REFLEX MICROSCOPIC
GLUCOSE, UA: NEGATIVE mg/dL
Hgb urine dipstick: NEGATIVE
Ketones, ur: NEGATIVE mg/dL
Leukocytes, UA: NEGATIVE
Nitrite: NEGATIVE
Protein, ur: NEGATIVE mg/dL
SPECIFIC GRAVITY, URINE: 1.023 (ref 1.005–1.030)
pH: 7 (ref 5.0–8.0)

## 2018-01-28 LAB — POCT PREGNANCY, URINE: Preg Test, Ur: POSITIVE — AB

## 2018-01-28 MED ORDER — ACETAMINOPHEN 325 MG PO TABS
650.0000 mg | ORAL_TABLET | Freq: Once | ORAL | Status: AC
  Administered 2018-01-28: 650 mg via ORAL
  Filled 2018-01-28: qty 2

## 2018-01-28 NOTE — Discharge Instructions (Signed)

## 2018-01-28 NOTE — MAU Note (Signed)
Pt reports she started having abd pain about 2 hours ago. Denies ang vag bleeding or discharge.

## 2018-01-28 NOTE — MAU Provider Note (Signed)
History     CSN: [id number]  Arrival date and time: 01/28/18 1740   None     Chief Complaint  Patient presents with  . Abdominal Pain   HPI Ashley Casey is a 37 y.o. [id number] at [redacted]w[redacted]d who presents to the MAU with complaints of abdominal pain. Patient describes the pain as "comin and going" pain that has been present for the last 2.5 hours. Pain is generalized to her entire abdomen. She denies LOF, vaginal discharge, vaginal bleeding. Patient reports not drinking enough fluids today. Has only had one water bottle to drink. Feels like she is dry.   She receives prenatal care a [place] where she had a confirmed intrauterine pregnancy.    Past Medical History:  Diagnosis Date  . Abnormal Pap smear   . Anxiety   . Bell's palsy    after 5th pregnancy never resolved  . Cholelithiasis   . Fatigue   . Gestational diabetes    metformin  . Gestational diabetes mellitus, antepartum   . IBS (irritable bowel syndrome)   . Neuromuscular disorder [facility])     Past Surgical History:  Procedure Laterality Date  . 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 CHOLECYSTECTOMY  8/29/121  . THERAPEUTIC ABORTION    . WISDOM TOOTH EXTRACTION      Family History  Problem Relation Age of Onset  . Other Mother        sickle cell anemia  . Diabetes Mother   . Arthritis Brother   . Asthma Brother   . Diabetes Paternal Grandmother     Social History   Tobacco Use  . Smoking status: Never Smoker  . Smokeless tobacco: Never Used  Substance Use Topics  . Alcohol use: No  . Drug use: No    Allergies:  Allergies  Allergen Reactions  . 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    . Tegretol [Carbamazepine] Other (See Comments)    Reaction:  Seizures     Medications Prior to Admission  Medication Sig Dispense Refill Last Dose  . EPINEPHrine (EPIPEN 2-PAK) 0.3 mg/0.3 mL IJ SOAJ injection Inject 0.3 mLs (0.3 mg total) into  the muscle once as needed (for severe allergic reaction). CAll 911 immediately if you have to use this medicine 1 Device 1   . famotidine (PEPCID) 20 MG tablet Take 1 tablet (20 mg total) by mouth 2 (two) times daily. 10 tablet 0 03/05/2017 at Unknown time  . ibuprofen (ADVIL,MOTRIN) 800 MG tablet Take 1 tablet (800 mg total) by mouth every 8 (eight) hours as needed for headache, mild pain, moderate pain or cramping. 30 tablet 1   . Prenat-FeCbn-FeAsp-Meth-FA-DHA (PRENATE MINI) 18-0.6-0.4-350 MG CAPS Take 1 capsule by mouth daily.   03/05/2017 at Unknown time    Review of Systems  All systems reviewed and are negative for acute change except as noted in the HPI.  Physical Exam   Blood pressure 97/65, pulse 88, temperature 98.9 F (37.2 C), temperature source Oral, resp. rate 18, height 5\' 4"  (1.626 m), weight 178 lb (80.7 kg), last menstrual period 10/08/2017, unknown if currently breastfeeding.  Physical Exam  Constitutional: She is oriented to person, place, and time. She appears well-developed and well-nourished.  HENT:  Head: Normocephalic and atraumatic.  mucous membranes dry  Eyes: Conjunctivae and EOM are normal.  Neck: Normal range of motion. Neck supple.  Cardiovascular: Normal rate and regular rhythm.  Respiratory: Effort normal and breath sounds normal.  GI: Soft. She exhibits no distension. There is no tenderness. There is no guarding.  Genitourinary: Uterus normal. Cervix exhibits no motion tenderness. Vaginal discharge found.  Genitourinary Comments: General tenderness to pelvic exam. Cervix visualized to be long, thick, closed.  Musculoskeletal: Normal range of motion.  Neurological: She is alert and oriented to person, place, and time.  Skin: Skin is warm and dry.  Psychiatric: She has a normal mood and affect.   Results for orders placed or performed during the hospital encounter of 01/28/18  Wet prep, genital  Result Value Ref Range   Yeast Wet Prep HPF POC NONE SEEN  NONE SEEN   Trich, Wet Prep NONE SEEN NONE SEEN   Clue Cells Wet Prep HPF POC NONE SEEN NONE SEEN   WBC, Wet Prep HPF POC FEW (A) NONE SEEN   Sperm NONE SEEN   Urinalysis, Routine w reflex microscopic  Result Value Ref Range   Color, Urine AMBER (A) YELLOW   APPearance CLOUDY (A) CLEAR   Specific Gravity, Urine 1.023 1.005 - 1.030   pH 7.0 5.0 - 8.0   Glucose, UA NEGATIVE NEGATIVE mg/dL   Hgb urine dipstick NEGATIVE NEGATIVE   Bilirubin Urine SMALL (A) NEGATIVE   Ketones, ur NEGATIVE NEGATIVE mg/dL   Protein, ur NEGATIVE NEGATIVE mg/dL   Nitrite NEGATIVE NEGATIVE   Leukocytes, UA NEGATIVE NEGATIVE  Pregnancy, urine POC  Result Value Ref Range   Preg Test, Ur POSITIVE (A) NEGATIVE     MAU Course  Procedures  MDM VSS Nursing note reviewed PO hydrated in MAU Given Tylenol for pain Wet prep and UA unremarkable  Discussed case with Dr. Willis Modena whom is agreeable to plan  Most likely has uterine irritability from poor PO intake. Possible infection. Cultures pending.   Assessment and Plan  Abdominal cramping affecting pregnancy  Discharge home in stable condition Encouraged PO hydration Follow-up with OB provider General obstetric precautions given Patient to return MAU of symptoms worsen  Luiz Blare, DO 01/28/2018, 7:25 PM

## 2018-01-29 LAB — GC/CHLAMYDIA PROBE AMP (~~LOC~~) NOT AT ARMC
Chlamydia: NEGATIVE
Neisseria Gonorrhea: NEGATIVE

## 2018-02-24 ENCOUNTER — Inpatient Hospital Stay (HOSPITAL_COMMUNITY)
Admission: AD | Admit: 2018-02-24 | Discharge: 2018-02-24 | Disposition: A | Discharge status: Home / Self Care | Payer: Medicaid Other | Source: Ambulatory Visit | Attending: Obstetrics and Gynecology | Admitting: Obstetrics and Gynecology

## 2018-02-24 ENCOUNTER — Encounter (HOSPITAL_COMMUNITY): Payer: Self-pay | Admitting: *Deleted

## 2018-02-24 DIAGNOSIS — E876 Hypokalemia: Secondary | ICD-10-CM | POA: Insufficient documentation

## 2018-02-24 DIAGNOSIS — Z8261 Family history of arthritis: Secondary | ICD-10-CM | POA: Insufficient documentation

## 2018-02-24 DIAGNOSIS — Z91018 Allergy to other foods: Secondary | ICD-10-CM | POA: Diagnosis not present

## 2018-02-24 DIAGNOSIS — Z825 Family history of asthma and other chronic lower respiratory diseases: Secondary | ICD-10-CM | POA: Insufficient documentation

## 2018-02-24 DIAGNOSIS — K589 Irritable bowel syndrome without diarrhea: Secondary | ICD-10-CM | POA: Insufficient documentation

## 2018-02-24 DIAGNOSIS — O99282 Endocrine, nutritional and metabolic diseases complicating pregnancy, second trimester: Secondary | ICD-10-CM | POA: Insufficient documentation

## 2018-02-24 DIAGNOSIS — M545 Low back pain, unspecified: Secondary | ICD-10-CM

## 2018-02-24 DIAGNOSIS — Z832 Family history of diseases of the blood and blood-forming organs and certain disorders involving the immune mechanism: Secondary | ICD-10-CM | POA: Insufficient documentation

## 2018-02-24 DIAGNOSIS — R112 Nausea with vomiting, unspecified: Secondary | ICD-10-CM

## 2018-02-24 DIAGNOSIS — Z9889 Other specified postprocedural states: Secondary | ICD-10-CM | POA: Insufficient documentation

## 2018-02-24 DIAGNOSIS — O99355 Diseases of the nervous system complicating the puerperium: Secondary | ICD-10-CM | POA: Insufficient documentation

## 2018-02-24 DIAGNOSIS — O99612 Diseases of the digestive system complicating pregnancy, second trimester: Secondary | ICD-10-CM | POA: Insufficient documentation

## 2018-02-24 DIAGNOSIS — Z3A18 18 weeks gestation of pregnancy: Secondary | ICD-10-CM | POA: Diagnosis not present

## 2018-02-24 DIAGNOSIS — O219 Vomiting of pregnancy, unspecified: Secondary | ICD-10-CM | POA: Diagnosis not present

## 2018-02-24 DIAGNOSIS — O24415 Gestational diabetes mellitus in pregnancy, controlled by oral hypoglycemic drugs: Secondary | ICD-10-CM | POA: Insufficient documentation

## 2018-02-24 DIAGNOSIS — Z833 Family history of diabetes mellitus: Secondary | ICD-10-CM | POA: Diagnosis not present

## 2018-02-24 DIAGNOSIS — E871 Hypo-osmolality and hyponatremia: Secondary | ICD-10-CM

## 2018-02-24 DIAGNOSIS — Z8541 Personal history of malignant neoplasm of cervix uteri: Secondary | ICD-10-CM | POA: Insufficient documentation

## 2018-02-24 DIAGNOSIS — R197 Diarrhea, unspecified: Secondary | ICD-10-CM | POA: Diagnosis present

## 2018-02-24 DIAGNOSIS — Z888 Allergy status to other drugs, medicaments and biological substances status: Secondary | ICD-10-CM | POA: Insufficient documentation

## 2018-02-24 DIAGNOSIS — R1111 Vomiting without nausea: Secondary | ICD-10-CM | POA: Diagnosis present

## 2018-02-24 DIAGNOSIS — G510 Bell's palsy: Secondary | ICD-10-CM | POA: Diagnosis not present

## 2018-02-24 DIAGNOSIS — Z9049 Acquired absence of other specified parts of digestive tract: Secondary | ICD-10-CM | POA: Insufficient documentation

## 2018-02-24 DIAGNOSIS — R109 Unspecified abdominal pain: Secondary | ICD-10-CM | POA: Diagnosis present

## 2018-02-24 LAB — URINALYSIS, ROUTINE W REFLEX MICROSCOPIC
Bilirubin Urine: NEGATIVE
GLUCOSE, UA: NEGATIVE mg/dL
HGB URINE DIPSTICK: NEGATIVE
KETONES UR: 20 mg/dL — AB
Leukocytes, UA: NEGATIVE
NITRITE: NEGATIVE
PROTEIN: 30 mg/dL — AB
Specific Gravity, Urine: 1.02 (ref 1.005–1.030)
pH: 5 (ref 5.0–8.0)

## 2018-02-24 LAB — COMPREHENSIVE METABOLIC PANEL
ALBUMIN: 3 g/dL — AB (ref 3.5–5.0)
ALT: 14 U/L (ref 14–54)
AST: 18 U/L (ref 15–41)
Alkaline Phosphatase: 69 U/L (ref 38–126)
Anion gap: 9 (ref 5–15)
BUN: 8 mg/dL (ref 6–20)
CHLORIDE: 103 mmol/L (ref 101–111)
CO2: 19 mmol/L — AB (ref 22–32)
Calcium: 8.5 mg/dL — ABNORMAL LOW (ref 8.9–10.3)
Creatinine, Ser: 0.45 mg/dL (ref 0.44–1.00)
GFR calc Af Amer: >60 mL/min (ref >60)
GLUCOSE: 138 mg/dL — AB (ref 65–99)
POTASSIUM: 2.8 mmol/L — AB (ref 3.5–5.1)
SODIUM: 131 mmol/L — AB (ref 135–145)
Total Bilirubin: 1 mg/dL (ref 0.3–1.2)
Total Protein: 7.3 g/dL (ref 6.5–8.1)

## 2018-02-24 LAB — CBC WITH DIFFERENTIAL/PLATELET
BASOS PCT: 0 %
Basophils Absolute: 0 10*3/uL (ref 0.0–0.1)
EOS ABS: 0 10*3/uL (ref 0.0–0.7)
EOS PCT: 0 %
HCT: 35 % — ABNORMAL LOW (ref 36.0–46.0)
Hemoglobin: 11.9 g/dL — ABNORMAL LOW (ref 12.0–15.0)
LYMPHS PCT: 8 %
Lymphs Abs: 0.7 10*3/uL (ref 0.7–4.0)
MCH: 27.4 pg (ref 26.0–34.0)
MCHC: 34 g/dL (ref 30.0–36.0)
MCV: 80.5 fL (ref 78.0–100.0)
MONO ABS: 0.1 10*3/uL (ref 0.1–1.0)
Monocytes Relative: 2 %
Neutro Abs: 8 10*3/uL — ABNORMAL HIGH (ref 1.7–7.7)
Neutrophils Relative %: 90 %
Platelets: 222 10*3/uL (ref 150–400)
RBC: 4.35 MIL/uL (ref 3.87–5.11)
RDW: 13.6 % (ref 11.5–15.5)
WBC: 8.9 10*3/uL (ref 4.0–10.5)

## 2018-02-24 MED ORDER — IBUPROFEN 600 MG PO TABS
600.0000 mg | ORAL_TABLET | Freq: Once | ORAL | Status: AC
  Administered 2018-02-24: 600 mg via ORAL
  Filled 2018-02-24: qty 1

## 2018-02-24 MED ORDER — SODIUM CHLORIDE 0.9 % IV SOLN
INTRAVENOUS | Status: DC
  Administered 2018-02-24: 06:00:00 via INTRAVENOUS

## 2018-02-24 MED ORDER — LACTATED RINGERS IV SOLN
INTRAVENOUS | Status: DC
  Administered 2018-02-24: 05:00:00 via INTRAVENOUS

## 2018-02-24 MED ORDER — POTASSIUM CHLORIDE 10 MEQ/100ML IV SOLN
10.0000 meq | Freq: Once | INTRAVENOUS | Status: DC
  Administered 2018-02-24: 10 meq via INTRAVENOUS
  Filled 2018-02-24: qty 100

## 2018-02-24 MED ORDER — ONDANSETRON 4 MG PO TBDP: 4.0000 mg | ORAL_TABLET | Freq: Four times a day (QID) | ORAL | 0 refills | Status: DC | PRN

## 2018-02-24 MED ORDER — ONDANSETRON 4 MG PO TBDP
4.0000 mg | ORAL_TABLET | Freq: Once | ORAL | Status: AC
  Administered 2018-02-24: 4 mg via ORAL
  Filled 2018-02-24: qty 1

## 2018-02-24 MED ORDER — POTASSIUM CHLORIDE CRYS ER 20 MEQ PO TBCR: 40.0000 meq | EXTENDED_RELEASE_TABLET | Freq: Every day | ORAL | 0 refills | Status: DC

## 2018-02-24 NOTE — Discharge Instructions (Signed)
Back Pain, Adult Back pain is very common. The pain often gets better over time. The cause of back pain is usually not dangerous. Most people can learn to manage their back pain on their own. Follow these instructions at home: Watch your back pain for any changes. The following actions may help to lessen any pain you are feeling:  Stay active. Start with short walks on flat ground if you can. Try to walk farther each day.  Exercise regularly as told by your doctor. Exercise helps your back heal faster. It also helps avoid future injury by keeping your muscles strong and flexible.  Do not sit, drive, or stand in one place for more than 30 minutes.  Do not stay in bed. Resting more than 1-2 days can slow down your recovery.  Be careful when you bend or lift an object. Use good form when lifting: ? Bend at your knees. ? Keep the object close to your body. ? Do not twist.  Sleep on a firm mattress. Lie on your side, and bend your knees. If you lie on your back, put a pillow under your knees.  Take medicines only as told by your doctor.  Put ice on the injured area. ? Put ice in a plastic bag. ? Place a towel between your skin and the bag. ? Leave the ice on for 20 minutes, 2-3 times a day for the first 2-3 days. After that, you can switch between ice and heat packs.  Avoid feeling anxious or stressed. Find good ways to deal with stress, such as exercise.  Maintain a healthy weight. Extra weight puts stress on your back.  Contact a doctor if:  You have pain that does not go away with rest or medicine.  You have worsening pain that goes down into your legs or buttocks.  You have pain that does not get better in one week.  You have pain at night.  You lose weight.  You have a fever or chills. Get help right away if:  You cannot control when you poop (bowel movement) or pee (urinate).  Your arms or legs feel weak.  Your arms or legs lose feeling (numbness).  You feel sick  to your stomach (nauseous) or throw up (vomit).  You have belly (abdominal) pain.  You feel like you may pass out (faint). This information is not intended to replace advice given to you by your health care provider. Make sure you discuss any questions you have with your health care provider. Document Released: 05/26/2008 Document Revised: 05/15/2016 Document Reviewed: 04/11/2014 Elsevier Interactive Patient Education  2018 [place]. Potassium Salts tablets, extended-release tablets or capsules What is this medicine? POTASSIUM (poe TASS i um) is a natural salt that is important for the heart, muscles, and nerves. It is found in many foods and is normally supplied by a well balanced diet. This medicine is used to treat low potassium. This medicine may be used for other purposes; ask your health care provider or pharmacist if you have questions. COMMON BRAND NAME(S): ED-K+10, Glu-K, K-10, K-8, K-Dur, K-Tab, Kaon-CL, Klor-Con, Klor-Con M10, Klor-Con M15, Klor-Con M20, Klotrix, Micro-K, Micro-K Extencaps, Slow-K What should I tell my health care provider before I take this medicine? They need to know if you have any of these conditions: -Addison's disease -dehydration -diabetes -difficulty swallowing -heart disease -history of high levels of potassium in the blood -irregular heartbeat -kidney disease -recent severe burn -stomach ulcers or other stomach problems -an unusual or allergic  reaction to potassium, tartrazine, other medicines, foods, dyes, or preservatives -pregnant or trying to get pregnant -breast-feeding How should I use this medicine? Take this medicine by mouth with a full glass of water. Take with food. Follow the directions on the prescription label. Do not suck on, crush, or chew this medicine. If you have difficulty swallowing, ask the pharmacist how to take. Take your medicine at regular intervals. Do not take it more often than directed. Do not stop taking except on  your doctor's advice. Talk to your pediatrician regarding the use of this medicine in children. Special care may be needed. Overdosage: If you think you have taken too much of this medicine contact a poison control center or emergency room at once. NOTE: This medicine is only for you. Do not share this medicine with others. What if I miss a dose? If you miss a dose, take it as soon as you can. If it is almost time for your next dose, take only that dose. Do not take double or extra doses. What may interact with this medicine? Do not take this medicine with any of the following medications: -eplerenone -certain medicines for stomach problems like atropine; difenoxin and glycopyrrolate -sodium polystyrene sulfonate This medicine may also interact with the following medications: -certain medicines for blood pressure or heart disease like lisinopril, losartan, quinapril, valsartan -medicines for cold or allergies -NSAIDs, medicines for pain and inflammation, like ibuprofen or napoxen -other potassium supplements -salt substitutes -some diuretics This list may not describe all possible interactions. Give your health care provider a list of all the medicines, herbs, non-prescription drugs, or dietary supplements you use. Also tell them if you smoke, drink alcohol, or use illegal drugs. Some items may interact with your medicine. What should I watch for while using this medicine? Visit your doctor or health care professional for regular check ups. You will need lab work done regularly. You may need to be on a special diet while taking this medicine. Ask your doctor. What side effects may I notice from receiving this medicine? Side effects that you should report to your doctor or health care professional as soon as possible: -allergic reactions like skin rash, itching or hives, swelling of the face, lips, or tongue -anxious -black, tarry stools -breathing  problems -confusion -heartburn -irregular heartbeat -numbness or tingling in hands or feet -pain when swallowing -unusually weak or tired -weakness, heaviness of legs Side effects that usually do not require medical attention (report to your doctor or health care professional if they continue or are bothersome): -diarrhea -nausea -upset stomach -vomiting This list may not describe all possible side effects. Call your doctor for medical advice about side effects. You may report side effects to FDA at 1-800-FDA-1088. Where should I keep my medicine? Keep out of the reach of children. Store at room temperature between 15 and 30 degrees C (59 and 86 degrees F ). Keep bottle closed tightly to protect this medicine from light and moisture. Throw away any unused medicine after the expiration date. NOTE: This sheet is a summary. It may not cover all possible information. If you have questions about this medicine, talk to your doctor, pharmacist, or health care provider.  2018 Elsevier/Gold Standard (2015-05-17 08:55:21)  Vomiting, Adult Vomiting occurs when stomach contents are thrown up and out of the mouth. Many people notice nausea before vomiting. Vomiting can make you feel weak and dehydrated. Dehydration can make you tired and thirsty, cause you to have a dry mouth, and decrease  how often you urinate. Older adults and people who have other diseases or a weak immune system are at higher risk for dehydration.It is important to treat vomiting as told by your health care provider. Follow these instructions at home: Follow your health care providers instructions about how to care for yourself at home. Eating and drinking Follow these recommendations as told by your health care provider:  Take an oral rehydration solution (ORS). This is a drink that is sold at pharmacies and retail stores.  Eat bland, easy-to-digest foods in small amounts as you are able. These foods include bananas,  applesauce, rice, lean meats, toast, and crackers.  Drink clear fluids in small amounts as you are able. Clear fluids include water, ice chips, low-calorie sports drinks, and fruit juice that has water added (diluted fruit juice).  Avoid fluids that contain a lot of sugar or caffeine.  Avoid alcohol and foods that are spicy or fatty.  General instructions   Wash your hands frequently with soap and water. If soap and water are not available, use hand sanitizer. Make sure that everyone in your household washes their hands frequently.  Take over-the-counter and prescription medicines only as told by your health care provider.  Watch your condition for any changes.  Keep all follow-up visits as told by your health care provider. This is important. Contact a health care provider if:  You have a fever.  You are not able to keep fluids down.  Your vomiting gets worse.  You have new symptoms.  You feel light-headed or dizzy.  You have a headache.  You have muscle cramps. Get help right away if:  You have pain in your chest, neck, arm, or jaw.  You feel extremely weak or you faint.  You have persistent vomiting.  You have vomit that is bright red or looks like black coffee grounds.  You have stools that are bloody or black, or stools that look like tar.  You have severe pain, cramping, or bloating in your abdomen.  You have a severe headache, a stiff neck, or both.  You have a rash.  You have trouble breathing or you are breathing very quickly.  Your heart is beating very quickly.  Your skin feels cold and clammy.  You feel confused.  You have pain while urinating.  You have signs of dehydration, such as: ? Dark urine, or very little or no urine. ? Cracked lips. ? Dry mouth. ? Sunken eyes. ? Sleepiness. ? Weakness. These symptoms may represent a serious problem that is an emergency. Do not wait to see if the symptoms will go away. Get medical help right  away. Call your local emergency services (911 in the [place] Do not drive yourself to the hospital. This information is not intended to replace advice given to you by your health care provider. Make sure you discuss any questions you have with your health care provider. Document Released: 01/04/2016 Document Revised: 05/15/2016 Document Reviewed: 08/14/2015 Elsevier Interactive Patient Education  [employer].

## 2018-02-24 NOTE — MAU Provider Note (Signed)
Chief Complaint:  No chief complaint on file.   First Provider Initiated Contact with Patient 02/24/18 [phone]     HPI: Ashley Casey is a 37 y.o. [id number] at 23w0dwho presents to maternity admissions reporting vomiting since 2200hrs.  Tells me she has diarrhea, told RN she did not.  Has abdominal cramping. . She reports good fetal movement, denies LOF, vaginal bleeding, vaginal itching/burning, urinary symptoms, h/a, dizziness, constipation or fever/chills.  .Two of her children at home are sick with the same thing, all started today.   Emesis   This is a new problem. The current episode started today. The problem has been unchanged. There has been no fever. Associated symptoms include abdominal pain and diarrhea. Pertinent negatives include no chills, dizziness, fever or myalgias. Risk factors include ill contacts. She has tried nothing for the symptoms.    RN Note: PT SAYS SHE  STARTED  VOMITING  AT 10PM.        ALSO  BABY  AND 7 YO    VOMITED YESTERDAY . [place] . NO DIARRHEA,  NO FEVER,  NO COUGH, SORE THROAT , RUNNY NOSE    Past Medical History: Past Medical History:  Diagnosis Date  . Abnormal Pap smear   . Anxiety   . Bell's palsy    after 5th pregnancy never resolved  . Cholelithiasis   . Fatigue   . Gestational diabetes    metformin  . Gestational diabetes mellitus, antepartum   . IBS (irritable bowel syndrome)   . Neuromuscular disorder ([place])     Past obstetric history: OB History  Gravida Para Term Preterm AB Living  10 6 6   3 6   SAB TAB Ectopic Multiple Live Births  2 1   0 6    # Outcome Date GA Lbr Len/2nd Weight Sex Delivery Anes PTL Lv  10 Current           9 Term 03/06/17 [redacted]w[redacted]d / 00:10 9 lb 9 oz (4.338 kg) M Vag-Spont EPI  LIV     Birth Comments: WNL  8 Term 11/18/14 [redacted]w[redacted]d 18:45 / 00:14 9 lb 2.4 oz (4.15 kg) M Vag-Spont EPI  LIV  7 Term 10/29/05 [redacted]w[redacted]d  7 lb 4 oz (3.289 kg) F Vag-Spont None  LIV  6 Term 03/21/03 [redacted]w[redacted]d  7 lb 3 oz (3.26  kg) F Vag-Spont None  LIV  5 SAB 2003          4 Term 09/04/97 [redacted]w[redacted]d  7 lb 4 oz (3.289 kg) M Vag-Spont None  LIV  3 SAB              Birth Comments: System Generated. Please review and update pregnancy details.  2 TAB              Birth Comments: TWINS  1 Term     M Vag-Spont   LIV      Past Surgical History: Past Surgical History:  Procedure Laterality Date  . 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 CHOLECYSTECTOMY  8/29/121  . THERAPEUTIC ABORTION    . WISDOM TOOTH EXTRACTION      Family History: Family History  Problem Relation Age of Onset  . Other Mother        sickle cell anemia  . Diabetes Mother   . Arthritis Brother   . Asthma Brother   . Diabetes Paternal Grandmother     Social History: Social History   Tobacco Use  .  Smoking status: Never Smoker  . Smokeless tobacco: Never Used  Substance Use Topics  . Alcohol use: No  . Drug use: No    Allergies:  Allergies  Allergen Reactions  . 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    . Tegretol [Carbamazepine] Other (See Comments)    Reaction:  Seizures     Meds:  Medications Prior to Admission  Medication Sig Dispense Refill Last Dose  . EPINEPHrine (EPIPEN 2-PAK) 0.3 mg/0.3 mL IJ SOAJ injection Inject 0.3 mLs (0.3 mg total) into the muscle once as needed (for severe allergic reaction). CAll 911 immediately if you have to use this medicine 1 Device 1   . famotidine (PEPCID) 20 MG tablet Take 1 tablet (20 mg total) by mouth 2 (two) times daily. 10 tablet 0 03/05/2017 at Unknown time  . Prenat-FeCbn-FeAsp-Meth-FA-DHA (PRENATE MINI) 18-0.6-0.4-350 MG CAPS Take 1 capsule by mouth daily.   03/05/2017 at Unknown time    I have reviewed patient's Past Medical Hx, Surgical Hx, Family Hx, Social Hx, medications and allergies.   ROS:  Review of Systems  Constitutional: Negative for chills and fever.  Gastrointestinal: Positive for abdominal pain,  diarrhea and vomiting.  Musculoskeletal: Negative for myalgias.  Neurological: Negative for dizziness.   Other systems negative  Physical Exam   Patient Vitals for the past 24 hrs:  BP Temp Temp src Pulse Resp  02/24/18 0351 108/63 98.1 F (36.7 C) Oral (!) 107 20   Constitutional: Well-developed, well-nourished female in no acute distress.  Cardiovascular: normal rate and rhythm Respiratory: normal effort, clear to auscultation bilaterally GI: Abd soft, non-tender, gravid appropriate for gestational age.   No rebound or guarding. MS: Extremities nontender, no edema, normal ROM Neurologic: Alert and oriented x 4.  GU: Neg CVAT.  PELVIC EXAM:  deferred  FHT:  156  Labs: --/--/O POS (03/16 1022) Results for orders placed or performed during the hospital encounter of 02/24/18 (from the past 24 hour(s))  Urinalysis, Routine w reflex microscopic     Status: Abnormal   Collection Time: 02/24/18  3:38 AM  Result Value Ref Range   Color, Urine YELLOW YELLOW   APPearance HAZY (A) CLEAR   Specific Gravity, Urine 1.020 1.005 - 1.030   pH 5.0 5.0 - 8.0   Glucose, UA NEGATIVE NEGATIVE mg/dL   Hgb urine dipstick NEGATIVE NEGATIVE   Bilirubin Urine NEGATIVE NEGATIVE   Ketones, ur 20 (A) NEGATIVE mg/dL   Protein, ur 30 (A) NEGATIVE mg/dL   Nitrite NEGATIVE NEGATIVE   Leukocytes, UA NEGATIVE NEGATIVE   RBC / HPF 0-5 0 - 5 RBC/hpf   WBC, UA 6-30 0 - 5 WBC/hpf   Bacteria, UA MANY (A) NONE SEEN   Squamous Epithelial / LPF 0-5 (A) NONE SEEN   Mucus PRESENT   CBC with Differential/Platelet     Status: Abnormal   Collection Time: 02/24/18  4:18 AM  Result Value Ref Range   WBC 8.9 4.0 - 10.5 K/uL   RBC 4.35 3.87 - 5.11 MIL/uL   Hemoglobin 11.9 (L) 12.0 - 15.0 g/dL   HCT 35.0 (L) 36.0 - 46.0 %   MCV 80.5 78.0 - 100.0 fL   MCH 27.4 26.0 - 34.0 pg   MCHC 34.0 30.0 - 36.0 g/dL   RDW 13.6 11.5 - 15.5 %   Platelets 222 150 - 400 K/uL   Neutrophils Relative % 90 %   Neutro Abs 8.0 (  H)  1.7 - 7.7 K/uL   Lymphocytes Relative 8 %   Lymphs Abs 0.7 0.7 - 4.0 K/uL   Monocytes Relative 2 %   Monocytes Absolute 0.1 0.1 - 1.0 K/uL   Eosinophils Relative 0 %   Eosinophils Absolute 0.0 0.0 - 0.7 K/uL   Basophils Relative 0 %   Basophils Absolute 0.0 0.0 - 0.1 K/uL  Comprehensive metabolic panel     Status: Abnormal   Collection Time: 02/24/18  4:18 AM  Result Value Ref Range   Sodium 131 (L) 135 - 145 mmol/L   Potassium 2.8 (L) 3.5 - 5.1 mmol/L   Chloride 103 101 - 111 mmol/L   CO2 19 (L) 22 - 32 mmol/L   Glucose, Bld 138 (H) 65 - 99 mg/dL   BUN 8 6 - 20 mg/dL   Creatinine, Ser 0.45 0.44 - 1.00 mg/dL   Calcium 8.5 (L) 8.9 - 10.3 mg/dL   Total Protein 7.3 6.5 - 8.1 g/dL   Albumin 3.0 (L) 3.5 - 5.0 g/dL   AST 18 15 - 41 U/L   ALT 14 14 - 54 U/L   Alkaline Phosphatase 69 38 - 126 U/L   Total Bilirubin 1.0 0.3 - 1.2 mg/dL   GFR calc non Af Amer >60 >60 mL/min   GFR calc Af Amer >60 >60 mL/min   Anion gap 9 5 - 15    Imaging:  No results found.  MAU Course/MDM: I have ordered labs and reviewed results.  NST reviewed Consult Dr Sandford Craze with presentation, exam findings and test results.  Treatments in MAU included IV hydration, potassium supplement (will do one run of K+)  Will send home with two doses of K-Dur. .  Did not tolerate IV potassium, will just do PO at home Gave one dose of ibuprofen for back pain which relieved it No vomiting while here.    Assessment: Single IUP at  [redacted]w[redacted]d Vomiting Abdominal cramping Likely gastroenteritis, probably viral  Hypokalemia Hyponatremia  Plan: Discharge home Rx K-Dur x 2 days for hypokalemia Rx Zofran for nausea/vomiting Advance diet as tolerated Labor precautions and fetal kick counts Follow up in Office for prenatal visits and recheck  Encouraged to return here or to other Urgent Care/ED if she develops worsening of symptoms, increase in pain, fever, or other concerning symptoms.   Pt stable at time of  discharge.  Hansel Feinstein CNM, MSN Certified Nurse-Midwife 02/24/2018 3:57 AM

## 2018-02-24 NOTE — MAU Note (Signed)
PO ICE CHIPS

## 2018-02-24 NOTE — MAU Note (Signed)
PT SAYS SHE  STARTED  VOMITING  AT 10PM.        ALSO  BABY  AND 39 YO    VOMITED YESTERDAY . [place] . NO DIARRHEA,  NO FEVER,  NO COUGH, SORE THROAT , RUNNY NOSE

## 2018-02-25 LAB — CULTURE, OB URINE: CULTURE: NO GROWTH

## 2018-03-03 ENCOUNTER — Encounter: Payer: Medicaid Other | Attending: Obstetrics and Gynecology | Admitting: Registered"

## 2018-03-03 DIAGNOSIS — Z713 Dietary counseling and surveillance: Secondary | ICD-10-CM | POA: Diagnosis not present

## 2018-03-03 DIAGNOSIS — O99810 Abnormal glucose complicating pregnancy: Secondary | ICD-10-CM | POA: Insufficient documentation

## 2018-03-03 DIAGNOSIS — R7309 Other abnormal glucose: Secondary | ICD-10-CM

## 2018-03-04 ENCOUNTER — Encounter: Payer: Self-pay | Admitting: Registered"

## 2018-03-04 DIAGNOSIS — R7309 Other abnormal glucose: Secondary | ICD-10-CM | POA: Insufficient documentation

## 2018-03-04 NOTE — Progress Notes (Signed)
Patient was seen on 03/03/2018 for Gestational Diabetes self-management class at the [facility]. The following learning objectives were met by the patient during this course:   States the definition of Gestational Diabetes  States why dietary management is important in controlling blood glucose  Describes the effects each nutrient has on blood glucose levels  Demonstrates ability to create a balanced meal plan  Demonstrates carbohydrate counting   States when to check blood glucose levels  Demonstrates proper blood glucose monitoring techniques  States the effect of stress and exercise on blood glucose levels  States the importance of limiting caffeine and abstaining from alcohol and smoking  Blood glucose monitor given: Accu-chek Lot # [id number] Exp: 03/06/2019 Blood glucose reading: 95 mg/dL  Patient instructed to monitor glucose levels: FBS: 60 - <95; 1 hour: <140; 2 hour: <120  Patient received handouts:  Nutrition Diabetes and Pregnancy, including carb counting list  Patient will be seen for follow-up as needed.

## 2018-03-17 DIAGNOSIS — O24419 Gestational diabetes mellitus in pregnancy, unspecified control: Secondary | ICD-10-CM | POA: Insufficient documentation

## 2018-05-13 ENCOUNTER — Encounter (HOSPITAL_COMMUNITY): Payer: Self-pay

## 2018-05-13 ENCOUNTER — Inpatient Hospital Stay (HOSPITAL_COMMUNITY)
Admission: AD | Admit: 2018-05-13 | Discharge: 2018-05-14 | Disposition: A | Discharge status: Home / Self Care | Payer: Medicaid Other | Source: Ambulatory Visit | Attending: Obstetrics and Gynecology | Admitting: Obstetrics and Gynecology

## 2018-05-13 DIAGNOSIS — E860 Dehydration: Secondary | ICD-10-CM

## 2018-05-13 DIAGNOSIS — O471 False labor at or after 37 completed weeks of gestation: Secondary | ICD-10-CM | POA: Diagnosis not present

## 2018-05-13 DIAGNOSIS — Z3A29 29 weeks gestation of pregnancy: Secondary | ICD-10-CM

## 2018-05-13 DIAGNOSIS — O219 Vomiting of pregnancy, unspecified: Secondary | ICD-10-CM

## 2018-05-13 DIAGNOSIS — O26893 Other specified pregnancy related conditions, third trimester: Secondary | ICD-10-CM | POA: Insufficient documentation

## 2018-05-13 DIAGNOSIS — B86 Scabies: Secondary | ICD-10-CM | POA: Insufficient documentation

## 2018-05-13 DIAGNOSIS — O479 False labor, unspecified: Secondary | ICD-10-CM

## 2018-05-13 DIAGNOSIS — O212 Late vomiting of pregnancy: Secondary | ICD-10-CM | POA: Diagnosis not present

## 2018-05-13 DIAGNOSIS — O4703 False labor before 37 completed weeks of gestation, third trimester: Secondary | ICD-10-CM | POA: Diagnosis not present

## 2018-05-13 LAB — URINALYSIS, ROUTINE W REFLEX MICROSCOPIC
BILIRUBIN URINE: NEGATIVE
GLUCOSE, UA: NEGATIVE mg/dL
HGB URINE DIPSTICK: NEGATIVE
KETONES UR: 80 mg/dL — AB
LEUKOCYTES UA: NEGATIVE
Nitrite: NEGATIVE
PH: 6 (ref 5.0–8.0)
Protein, ur: 30 mg/dL — AB
Specific Gravity, Urine: 1.02 (ref 1.005–1.030)

## 2018-05-13 MED ORDER — PROMETHAZINE HCL 25 MG/ML IJ SOLN
25.0000 mg | Freq: Once | INTRAMUSCULAR | Status: AC
  Administered 2018-05-13: 25 mg via INTRAVENOUS
  Filled 2018-05-13: qty 1

## 2018-05-13 MED ORDER — LACTATED RINGERS IV BOLUS
1000.0000 mL | Freq: Once | INTRAVENOUS | Status: AC
  Administered 2018-05-13: 1000 mL via INTRAVENOUS

## 2018-05-13 NOTE — MAU Provider Note (Signed)
Chief Complaint:  Emesis and Contractions   First Provider Initiated Contact with Patient 05/13/18 2122      HPI: Ashley Casey is a 37 y.o. [id number] at [redacted]w[redacted]d who presents to maternity admissions reporting nausea/vomiting and contractions starting yesterday.  She reports new onset of vomiting yesterday, making it hard to keep anything down. She had Zofran ODT but reports the taste made her vomit immediately after taking the medicine today.  She reports Braxton-Hicks irregular contractions today but more frequent and more painful than usual.  She has not tried any treatments. There is dizziness since the vomiting but no other associated symptoms. She reports good fetal movement, denies LOF, vaginal bleeding, vaginal itching/burning, urinary symptoms, h/a, dizziness, or fever/chills.    She was diagnosed with scabies and used the topical medication prescribed 2 days ago as directed. She will use the medication again in 1 week as directed.  Her husband was also symptomatic and treated.  HPI  Past Medical History: Past Medical History:  Diagnosis Date  . Abnormal Pap smear   . Anxiety   . Bell's palsy    after 5th pregnancy never resolved  . Cholelithiasis   . Fatigue   . Gestational diabetes    metformin  . Gestational diabetes mellitus, antepartum   . IBS (irritable bowel syndrome)   . Neuromuscular disorder ([place])     Past obstetric history: OB History  Gravida Para Term Preterm AB Living  10 6 6   3 6   SAB TAB Ectopic Multiple Live Births  2 1   0 6    # Outcome Date GA Lbr Len/2nd Weight Sex Delivery Anes PTL Lv  10 Current           9 Term 03/06/17 [redacted]w[redacted]d / 00:10 9 lb 9 oz (4.338 kg) M Vag-Spont EPI  LIV     Birth Comments: WNL  8 Term 11/18/14 [redacted]w[redacted]d 18:45 / 00:14 9 lb 2.4 oz (4.15 kg) M Vag-Spont EPI  LIV  7 Term 10/29/05 [redacted]w[redacted]d  7 lb 4 oz (3.289 kg) F Vag-Spont None  LIV  6 Term 03/21/03 [redacted]w[redacted]d  7 lb 3 oz (3.26 kg) F Vag-Spont None  LIV  5 SAB 2003          4 Term  09/04/97 [redacted]w[redacted]d  7 lb 4 oz (3.289 kg) M Vag-Spont None  LIV  3 SAB              Birth Comments: System Generated. Please review and update pregnancy details.  2 TAB              Birth Comments: TWINS  1 Term     M Vag-Spont   LIV    Past Surgical History: Past Surgical History:  Procedure Laterality Date  . 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 CHOLECYSTECTOMY  8/29/121  . THERAPEUTIC ABORTION    . WISDOM TOOTH EXTRACTION      Family History: Family History  Problem Relation Age of Onset  . Other Mother        sickle cell anemia  . Diabetes Mother   . Arthritis Brother   . Asthma Brother   . Diabetes Paternal Grandmother     Social History: Social History   Tobacco Use  . Smoking status: Never Smoker  . Smokeless tobacco: Never Used  Substance Use Topics  . Alcohol use: No  . Drug use: No    Allergies:  Allergies  Allergen Reactions  . Food Anaphylaxis  and Other (See Comments)    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    . Tegretol [Carbamazepine] Other (See Comments)    Reaction:  Seizures     Meds:  Medications Prior to Admission  Medication Sig Dispense Refill Last Dose  . EPINEPHrine (EPIPEN 2-PAK) 0.3 mg/0.3 mL IJ SOAJ injection Inject 0.3 mLs (0.3 mg total) into the muscle once as needed (for severe allergic reaction). CAll 911 immediately if you have to use this medicine 1 Device 1   . famotidine (PEPCID) 20 MG tablet Take 1 tablet (20 mg total) by mouth 2 (two) times daily. 10 tablet 0 03/05/2017 at Unknown time  . ondansetron (ZOFRAN ODT) 4 MG disintegrating tablet Take 1 tablet (4 mg total) by mouth every 6 (six) hours as needed for nausea. 20 tablet 0   . potassium chloride SA (K-DUR,KLOR-CON) 20 MEQ tablet Take 2 tablets (40 mEq total) by mouth daily. 4 tablet 0   . Prenat-FeCbn-FeAsp-Meth-FA-DHA (PRENATE MINI) 18-0.6-0.4-350 MG CAPS Take 1 capsule by mouth daily.   03/05/2017 at Unknown time    ROS:  Review of Systems   Constitutional: Negative for chills, fatigue and fever.  Eyes: Negative for visual disturbance.  Respiratory: Negative for shortness of breath.   Cardiovascular: Negative for chest pain.  Gastrointestinal: Positive for abdominal pain, nausea and vomiting.  Genitourinary: Positive for pelvic pain. Negative for difficulty urinating, dysuria, flank pain, vaginal bleeding, vaginal discharge and vaginal pain.  Neurological: Positive for dizziness. Negative for headaches.  Psychiatric/Behavioral: Negative.      I have reviewed patient's Past Medical Hx, Surgical Hx, Family Hx, Social Hx, medications and allergies.   Physical Exam   Patient Vitals for the past 24 hrs:  BP Temp Temp src Pulse Resp  05/13/18 2319 94/60 98 F (36.7 C) Oral (!) 101 17   Constitutional: Well-developed, well-nourished female in no acute distress.  Cardiovascular: normal rate Respiratory: normal effort GI: Abd soft, non-tender, gravid appropriate for gestational age.  MS: Extremities nontender, no edema, normal ROM Neurologic: Alert and oriented x 4.  GU: Neg CVAT.   Dilation: Closed Effacement (%): 50 Cervical Position: Posterior Station: Ballotable Exam by:: [employer] CNM   FHT:  Baseline 135 , moderate variability, accelerations present, no decelerations Contractions: q 10-15 mins, irregular, mild to palpation   Labs: Results for orders placed or performed during the hospital encounter of 05/13/18 (from the past 24 hour(s))  Urinalysis, Routine w reflex microscopic     Status: Abnormal   Collection Time: 05/13/18  9:36 PM  Result Value Ref Range   Color, Urine YELLOW YELLOW   APPearance HAZY (A) CLEAR   Specific Gravity, Urine 1.020 1.005 - 1.030   pH 6.0 5.0 - 8.0   Glucose, UA NEGATIVE NEGATIVE mg/dL   Hgb urine dipstick NEGATIVE NEGATIVE   Bilirubin Urine NEGATIVE NEGATIVE   Ketones, ur 80 (A) NEGATIVE mg/dL   Protein, ur 30 (A) NEGATIVE mg/dL   Nitrite NEGATIVE NEGATIVE    Leukocytes, UA NEGATIVE NEGATIVE   RBC / HPF 0-5 0 - 5 RBC/hpf   WBC, UA 0-5 0 - 5 WBC/hpf   Bacteria, UA RARE (A) NONE SEEN   Squamous Epithelial / LPF 0-5 0 - 5   Mucus PRESENT       Imaging:  No results found.  MAU Course/MDM: I have ordered labs and reviewed results.  NST reviewed and reactive No evidence of preterm labor with closed cervix, irregular contractions Pt with  evidence of dehydration from  N/v so will replace fluids with LR x 1000 ml, and Phenergan 25 mg IV for nausea Pt with improved symptoms, tolerating PO food/fluids in MAU Consult Dr Sandford Craze with presentation, exam findings and test results.  D/C home with Rx for Phenergan. May take Zofran and Phenergan PRN. Discussed use of Phenergan tablets vaginally if cannot tolerate PO F/U in office as scheduled, return to MAU for signs of labor or emergencies Pt discharge with strict return precautions.  Assessment: 1. Nausea and vomiting during pregnancy   2. Mild dehydration   3. Braxton Hicks contractions     Plan: Discharge home Labor precautions and fetal kick counts Follow-up Information    Bovard-Stuckert, Jody, MD Follow up.   Specialty:  Obstetrics and Gynecology Why:  As scheduled, return to MAU as needed for emergencies Contact information: [address] [place] [address] [phone]          Allergies as of 05/14/2018      Reactions   Food Anaphylaxis, Other (See Comments)   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     Tegretol [carbamazepine] Other (See Comments)   Reaction:  Seizures       Medication List    TAKE these medications   EPINEPHrine 0.3 mg/0.3 mL Soaj injection Commonly known as:  EPIPEN 2-PAK Inject 0.3 mLs (0.3 mg total) into the muscle once as needed (for severe allergic reaction). CAll 911 immediately if you have to use this medicine   famotidine 20 MG tablet Commonly known as:  PEPCID Take 1 tablet (20 mg total) by mouth 2  (two) times daily.   ondansetron 4 MG disintegrating tablet Commonly known as:  ZOFRAN ODT Take 1 tablet (4 mg total) by mouth every 6 (six) hours as needed for nausea.   potassium chloride SA 20 MEQ tablet Commonly known as:  K-DUR,KLOR-CON Take 2 tablets (40 mEq total) by mouth daily.   PRENATE MINI 18-0.6-0.4-350 MG Caps Take 1 capsule by mouth daily.   promethazine 25 MG tablet Commonly known as:  PHENERGAN Take 0.5-1 tablets (12.5-25 mg total) by mouth every 6 (six) hours as needed.       Fatima Blank Certified Nurse-Midwife 05/14/2018 12:02 AM

## 2018-05-13 NOTE — MAU Note (Signed)
Pt started having vomiting today. No other family members are sick. Pt is having some ctx. 5/10. Pt just started treatement for scabies two days ago. No bleeding or LOF. + FM

## 2018-05-13 NOTE — Progress Notes (Signed)
Late entry 2237. Spoke with Sherlynn Stalls (On call for IP) at 2200 r/t pt tx for scabies. Ms. Sherlynn Stalls stated that pt is to be on contact isolation for 24 hrs after she had her tx. I informed Ms. Sherlynn Stalls that pt had her tx outpatient and came here to MAU 2 days later. Per Ms. Sherlynn Stalls pt has low risk for infecting others. Discussed cleaning of known areas pt has frequented and cleaning method was approved by Ms. Esther. [employer] aware and will clean room after d/c. Spoke with medical provider Lattie Haw and relayed information. I am in agreement with Lattie Haw that pt will remain on contact isolation until d/c and room to be cleaned according to protocol.

## 2018-05-14 MED ORDER — PROMETHAZINE HCL 25 MG PO TABS: 12.5000 mg | ORAL_TABLET | Freq: Four times a day (QID) | ORAL | 2 refills | Status: DC | PRN

## 2018-05-20 ENCOUNTER — Inpatient Hospital Stay (HOSPITAL_COMMUNITY)
Admission: AD | Admit: 2018-05-20 | Discharge: 2018-05-20 | Disposition: A | Discharge status: Home / Self Care | Payer: Medicaid Other | Source: Ambulatory Visit | Attending: Obstetrics and Gynecology | Admitting: Obstetrics and Gynecology

## 2018-05-20 ENCOUNTER — Encounter (HOSPITAL_COMMUNITY): Payer: Self-pay

## 2018-05-20 DIAGNOSIS — Z9049 Acquired absence of other specified parts of digestive tract: Secondary | ICD-10-CM | POA: Insufficient documentation

## 2018-05-20 DIAGNOSIS — Z3A30 30 weeks gestation of pregnancy: Secondary | ICD-10-CM

## 2018-05-20 DIAGNOSIS — K589 Irritable bowel syndrome without diarrhea: Secondary | ICD-10-CM | POA: Insufficient documentation

## 2018-05-20 DIAGNOSIS — Z832 Family history of diseases of the blood and blood-forming organs and certain disorders involving the immune mechanism: Secondary | ICD-10-CM | POA: Diagnosis not present

## 2018-05-20 DIAGNOSIS — Z9889 Other specified postprocedural states: Secondary | ICD-10-CM | POA: Diagnosis not present

## 2018-05-20 DIAGNOSIS — Z79899 Other long term (current) drug therapy: Secondary | ICD-10-CM | POA: Diagnosis not present

## 2018-05-20 DIAGNOSIS — O24419 Gestational diabetes mellitus in pregnancy, unspecified control: Secondary | ICD-10-CM | POA: Insufficient documentation

## 2018-05-20 DIAGNOSIS — O4703 False labor before 37 completed weeks of gestation, third trimester: Secondary | ICD-10-CM | POA: Diagnosis not present

## 2018-05-20 DIAGNOSIS — O26893 Other specified pregnancy related conditions, third trimester: Secondary | ICD-10-CM

## 2018-05-20 DIAGNOSIS — Z825 Family history of asthma and other chronic lower respiratory diseases: Secondary | ICD-10-CM | POA: Insufficient documentation

## 2018-05-20 DIAGNOSIS — Z888 Allergy status to other drugs, medicaments and biological substances status: Secondary | ICD-10-CM | POA: Diagnosis not present

## 2018-05-20 DIAGNOSIS — Z8261 Family history of arthritis: Secondary | ICD-10-CM | POA: Insufficient documentation

## 2018-05-20 DIAGNOSIS — O479 False labor, unspecified: Secondary | ICD-10-CM

## 2018-05-20 DIAGNOSIS — Z91018 Allergy to other foods: Secondary | ICD-10-CM | POA: Insufficient documentation

## 2018-05-20 DIAGNOSIS — Z7984 Long term (current) use of oral hypoglycemic drugs: Secondary | ICD-10-CM | POA: Insufficient documentation

## 2018-05-20 DIAGNOSIS — Z3689 Encounter for other specified antenatal screening: Secondary | ICD-10-CM

## 2018-05-20 DIAGNOSIS — Z833 Family history of diabetes mellitus: Secondary | ICD-10-CM | POA: Diagnosis not present

## 2018-05-20 DIAGNOSIS — N949 Unspecified condition associated with female genital organs and menstrual cycle: Secondary | ICD-10-CM

## 2018-05-20 DIAGNOSIS — O99613 Diseases of the digestive system complicating pregnancy, third trimester: Secondary | ICD-10-CM | POA: Diagnosis not present

## 2018-05-20 LAB — URINALYSIS, ROUTINE W REFLEX MICROSCOPIC
Bilirubin Urine: NEGATIVE
Glucose, UA: NEGATIVE mg/dL
Hgb urine dipstick: NEGATIVE
Ketones, ur: NEGATIVE mg/dL
Leukocytes, UA: NEGATIVE
Nitrite: NEGATIVE
Protein, ur: NEGATIVE mg/dL
Specific Gravity, Urine: 1.018 (ref 1.005–1.030)
pH: 6 (ref 5.0–8.0)

## 2018-05-20 NOTE — MAU Provider Note (Signed)
Chief Complaint:  Contractions   First Provider Initiated Contact with Patient 05/20/18 [phone]      HPI: Ashley Casey is a 37 y.o. [id number] at 35w1dwho presents to maternity admissions reporting contractions. She reports her contractions started occurring last night around 1800- she reports that her contractions are not painful they are just uncomfortable due to pressure. She reports increasing the amount of water throughout the night, went to sleep and woke up to continued contractions. She reports good fetal movement, denies LOF, vaginal bleeding, vaginal itching/burning. She denies recent IC and denies having a hx of PTL or PTD.  Past Medical History: Past Medical History:  Diagnosis Date  . Abnormal Pap smear   . Anxiety   . Bell's palsy    after 5th pregnancy never resolved  . Cholelithiasis   . Fatigue   . Gestational diabetes    metformin  . Gestational diabetes mellitus, antepartum   . IBS (irritable bowel syndrome)   . Neuromuscular disorder ([place])     Past obstetric history: OB History  Gravida Para Term Preterm AB Living  10 6 6   3 6   SAB TAB Ectopic Multiple Live Births  2 1   0 6    # Outcome Date GA Lbr Len/2nd Weight Sex Delivery Anes PTL Lv  10 Current           9 Term 03/06/17 [redacted]w[redacted]d / 00:10 9 lb 9 oz (4.338 kg) M Vag-Spont EPI  LIV     Birth Comments: WNL  8 Term 11/18/14 [redacted]w[redacted]d 18:45 / 00:14 9 lb 2.4 oz (4.15 kg) M Vag-Spont EPI  LIV  7 Term 10/29/05 [redacted]w[redacted]d  7 lb 4 oz (3.289 kg) F Vag-Spont None  LIV  6 Term 03/21/03 [redacted]w[redacted]d  7 lb 3 oz (3.26 kg) F Vag-Spont None  LIV  5 SAB 2003          4 Term 09/04/97 [redacted]w[redacted]d  7 lb 4 oz (3.289 kg) M Vag-Spont None  LIV  3 SAB              Birth Comments: System Generated. Please review and update pregnancy details.  2 TAB              Birth Comments: TWINS  1 Term     M Vag-Spont   LIV    Past Surgical History: Past Surgical History:  Procedure Laterality Date  . 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  CHOLECYSTECTOMY  8/29/121  . THERAPEUTIC ABORTION    . WISDOM TOOTH EXTRACTION      Family History: Family History  Problem Relation Age of Onset  . Other Mother        sickle cell anemia  . Diabetes Mother   . Arthritis Brother   . Asthma Brother   . Diabetes Paternal Grandmother     Social History: Social History   Tobacco Use  . Smoking status: Never Smoker  . Smokeless tobacco: Never Used  Substance Use Topics  . Alcohol use: No  . Drug use: No    Allergies:  Allergies  Allergen Reactions  . 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    . Tegretol [Carbamazepine] Other (See Comments)    Reaction:  Seizures     Meds:  Medications Prior to Admission  Medication Sig Dispense Refill Last Dose  . famotidine (PEPCID) 20 MG tablet Take 1 tablet (20 mg  total) by mouth 2 (two) times daily. 10 tablet 0 05/19/2018 at Unknown time  . metFORMIN (GLUCOPHAGE) 1000 MG tablet Take 1,000 mg by mouth 2 (two) times daily with a meal.   05/20/2018 at Unknown time  . Prenat-FeCbn-FeAsp-Meth-FA-DHA (PRENATE MINI) 18-0.6-0.4-350 MG CAPS Take 1 capsule by mouth daily.   05/19/2018 at Unknown time  . EPINEPHrine (EPIPEN 2-PAK) 0.3 mg/0.3 mL IJ SOAJ injection Inject 0.3 mLs (0.3 mg total) into the muscle once as needed (for severe allergic reaction). CAll 911 immediately if you have to use this medicine 1 Device 1 More than a month at Unknown time  . ondansetron (ZOFRAN ODT) 4 MG disintegrating tablet Take 1 tablet (4 mg total) by mouth every 6 (six) hours as needed for nausea. 20 tablet 0   . potassium chloride SA (K-DUR,KLOR-CON) 20 MEQ tablet Take 2 tablets (40 mEq total) by mouth daily. 4 tablet 0   . promethazine (PHENERGAN) 25 MG tablet Take 0.5-1 tablets (12.5-25 mg total) by mouth every 6 (six) hours as needed. 30 tablet 2 More than a month at Unknown time    ROS:  Review of Systems  Respiratory: Negative.   Cardiovascular:  Negative.   Gastrointestinal: Positive for abdominal pain. Negative for constipation, diarrhea, nausea and vomiting.       Abdominal pressure with contractions- no abdominal pain at this time   Genitourinary: Negative.    I have reviewed patient's Past Medical Hx, Surgical Hx, Family Hx, Social Hx, medications and allergies.   Physical Exam   Patient Vitals for the past 24 hrs:  BP Temp Temp src Pulse Resp Height Weight  05/20/18 0726 102/61 - - 93 16 - -  05/20/18 0542 102/66 97.9 F (36.6 C) Oral 98 20 5\' 4"  (1.626 m) 194 lb (88 kg)   Constitutional: Well-developed, well-nourished female in no acute distress.  Cardiovascular: normal rate Respiratory: normal effort GI: Abd soft, non-tender, gravid appropriate for gestational age.  MS: Extremities nontender, no edema, normal ROM Neurologic: Alert and oriented x 4.   CERVICAL EXAM: no cervical change after reassessment at 1 hour, no cervical change from prior exam on 05/13/18.  Dilation: Closed Effacement (%): 50 Cervical Position: Posterior Exam by:: Darrol Poke, CNM  FHT:  Baseline 130 , moderate variability, accelerations present, no decelerations Contractions: 1 UC during MAU course/ mild by palpation, UI noted on monitor    Labs: Results for orders placed or performed during the hospital encounter of 05/20/18 (from the past 24 hour(s))  Urinalysis, Routine w reflex microscopic     Status: Abnormal   Collection Time: 05/20/18  5:32 AM  Result Value Ref Range   Color, Urine YELLOW YELLOW   APPearance HAZY (A) CLEAR   Specific Gravity, Urine 1.018 1.005 - 1.030   pH 6.0 5.0 - 8.0   Glucose, UA NEGATIVE NEGATIVE mg/dL   Hgb urine dipstick NEGATIVE NEGATIVE   Bilirubin Urine NEGATIVE NEGATIVE   Ketones, ur NEGATIVE NEGATIVE mg/dL   Protein, ur NEGATIVE NEGATIVE mg/dL   Nitrite NEGATIVE NEGATIVE   Leukocytes, UA NEGATIVE NEGATIVE   MAU Course/MDM: Orders Placed This Encounter  Procedures  . Urinalysis, Routine w  reflex microscopic  . Discharge patient Discharge disposition: 01-Home or Self Care; Discharge patient date: 05/20/2018   UA- negative  NST reviewed- reactive  Consult Dr Terri Piedra  with presentation, exam findings and test results.  Patient reports contractions have spaced out and eased up since arriving to MAU, patient able to sleep through contractions.  Pt discharge with strict PTL precautions. F/u as scheduled in the office for prenatal appointments.   Today's evaluation included a work-up for preterm labor which can be life-threatening for both mom and baby.  Assessment: 1. Braxton Hicks contractions   2. Pelvic pressure in pregnancy, antepartum, third trimester   3. NST (non-stress test) reactive     Plan: Discharge home. Pt stable prior to discharge home.  Preterm Labor precautions and fetal kick counts Follow up as scheduled in the office for prenatal appointment  Discussed labor precautions with patient and what to expect or when to return  Work note given  Pregnancy support belt encouraged due to multigravida status    Follow-up [place], [facility] Follow up.   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     Tegretol [carbamazepine] Other (See Comments)   Reaction:  Seizures       Medication List    TAKE these medications   EPINEPHrine 0.3 mg/0.3 mL Soaj injection Commonly known as:  EPIPEN 2-PAK Inject 0.3 mLs (0.3 mg total) into the muscle once as needed (for severe allergic reaction). CAll 911 immediately if you have to use this medicine   famotidine 20 MG tablet Commonly known as:  PEPCID Take 1 tablet (20 mg total) by mouth 2 (two) times daily.   metFORMIN 1000 MG tablet Commonly  known as:  GLUCOPHAGE Take 1,000 mg by mouth 2 (two) times daily with a meal.   ondansetron 4 MG disintegrating tablet Commonly known as:  ZOFRAN ODT Take 1 tablet (4 mg total) by mouth every 6 (six) hours as needed for nausea.   potassium chloride SA 20 MEQ tablet Commonly known as:  K-DUR,KLOR-CON Take 2 tablets (40 mEq total) by mouth daily.   PRENATE MINI 18-0.6-0.4-350 MG Caps Take 1 capsule by mouth daily.   promethazine 25 MG tablet Commonly known as:  PHENERGAN Take 0.5-1 tablets (12.5-25 mg total) by mouth every 6 (six) hours as needed.       Darrol Poke Certified Nurse-Midwife 05/20/2018 7:28 AM

## 2018-05-20 NOTE — Discharge Instructions (Signed)
Braxton Hicks Contractions °Contractions of the uterus can occur throughout pregnancy, but they are not always a sign that you are in labor. You may have practice contractions called Braxton Hicks contractions. These false labor contractions are sometimes confused with true labor. °What are Braxton Hicks contractions? °Braxton Hicks contractions are tightening movements that occur in the muscles of the uterus before labor. Unlike true labor contractions, these contractions do not result in opening (dilation) and thinning of the cervix. Toward the end of pregnancy (32-34 weeks), Braxton Hicks contractions can happen more often and may become stronger. These contractions are sometimes difficult to tell apart from true labor because they can be very uncomfortable. You should not feel embarrassed if you go to the hospital with false labor. °Sometimes, the only way to tell if you are in true labor is for your health care provider to look for changes in the cervix. The health care provider will do a physical exam and may monitor your contractions. If you are not in true labor, the exam should show that your cervix is not dilating and your water has not broken. °If there are other health problems associated with your pregnancy, it is completely safe for you to be sent home with false labor. You may continue to have Braxton Hicks contractions until you go into true labor. °How to tell the difference between true labor and false labor °True labor °· Contractions last 30-70 seconds. °· Contractions become very regular. °· Discomfort is usually felt in the top of the uterus, and it spreads to the lower abdomen and low back. °· Contractions do not go away with walking. °· Contractions usually become more intense and increase in frequency. °· The cervix dilates and gets thinner. °False labor °· Contractions are usually shorter and not as strong as true labor contractions. °· Contractions are usually irregular. °· Contractions  are often felt in the front of the lower abdomen and in the groin. °· Contractions may go away when you walk around or change positions while lying down. °· Contractions get weaker and are shorter-lasting as time goes on. °· The cervix usually does not dilate or become thin. °Follow these instructions at home: °· Take over-the-counter and prescription medicines only as told by your health care provider. °· Keep up with your usual exercises and follow other instructions from your health care provider. °· Eat and drink lightly if you think you are going into labor. °· If Braxton Hicks contractions are making you uncomfortable: °? Change your position from lying down or resting to walking, or change from walking to resting. °? Sit and rest in a tub of warm water. °? Drink enough fluid to keep your urine pale yellow. Dehydration may cause these contractions. °? Do slow and deep breathing several times an hour. °· Keep all follow-up prenatal visits as told by your health care provider. This is important. °Contact a health care provider if: °· You have a fever. °· You have continuous pain in your abdomen. °Get help right away if: °· Your contractions become stronger, more regular, and closer together. °· You have fluid leaking or gushing from your vagina. °· You pass blood-tinged mucus (bloody show). °· You have bleeding from your vagina. °· You have low back pain that you never had before. °· You feel your baby’s head pushing down and causing pelvic pressure. °· Your baby is not moving inside you as much as it used to. °Summary °· Contractions that occur before labor are called Braxton   Hicks contractions, false labor, or practice contractions. °· Braxton Hicks contractions are usually shorter, weaker, farther apart, and less regular than true labor contractions. True labor contractions usually become progressively stronger and regular and they become more frequent. °· Manage discomfort from Braxton Hicks contractions by  changing position, resting in a warm bath, drinking plenty of water, or practicing deep breathing. °This information is not intended to replace advice given to you by your health care provider. Make sure you discuss any questions you have with your health care provider. °Document Released: 04/23/2017 Document Revised: 04/23/2017 Document Reviewed: 04/23/2017 °Elsevier Interactive Patient Education © 2018 [employer] ° °

## 2018-05-20 NOTE — MAU Note (Signed)
Pt reports braxton hicks contractions since 6pm. States she drank a lot of water and slept, but woke up still having them. Pt states she went to work and her manager told her she needed to be checked out since she was contracting so much. Pt states contractions are not painful, just uncomfortable. Pt states she has "a tingling sensation in her butt that is sometimes sharp". Pt denies LOF or vaginal bleeding. Reports good fetal movement.

## 2018-06-09 ENCOUNTER — Inpatient Hospital Stay (HOSPITAL_COMMUNITY): Payer: Medicaid Other

## 2018-06-09 ENCOUNTER — Other Ambulatory Visit: Payer: Self-pay

## 2018-06-09 ENCOUNTER — Inpatient Hospital Stay (HOSPITAL_COMMUNITY)
Admission: AD | Admit: 2018-06-09 | Discharge: 2018-06-09 | Disposition: A | Discharge status: Home / Self Care | Payer: Medicaid Other | Source: Ambulatory Visit | Attending: Obstetrics and Gynecology | Admitting: Obstetrics and Gynecology

## 2018-06-09 ENCOUNTER — Encounter (HOSPITAL_COMMUNITY): Payer: Self-pay | Admitting: *Deleted

## 2018-06-09 DIAGNOSIS — Z9889 Other specified postprocedural states: Secondary | ICD-10-CM | POA: Insufficient documentation

## 2018-06-09 DIAGNOSIS — R102 Pelvic and perineal pain: Secondary | ICD-10-CM | POA: Diagnosis present

## 2018-06-09 DIAGNOSIS — Z3A32 32 weeks gestation of pregnancy: Secondary | ICD-10-CM | POA: Diagnosis not present

## 2018-06-09 DIAGNOSIS — Z91018 Allergy to other foods: Secondary | ICD-10-CM | POA: Diagnosis not present

## 2018-06-09 DIAGNOSIS — O4703 False labor before 37 completed weeks of gestation, third trimester: Secondary | ICD-10-CM | POA: Diagnosis not present

## 2018-06-09 DIAGNOSIS — Z833 Family history of diabetes mellitus: Secondary | ICD-10-CM | POA: Insufficient documentation

## 2018-06-09 DIAGNOSIS — Z79899 Other long term (current) drug therapy: Secondary | ICD-10-CM | POA: Diagnosis not present

## 2018-06-09 DIAGNOSIS — Z825 Family history of asthma and other chronic lower respiratory diseases: Secondary | ICD-10-CM | POA: Insufficient documentation

## 2018-06-09 DIAGNOSIS — Z9049 Acquired absence of other specified parts of digestive tract: Secondary | ICD-10-CM | POA: Insufficient documentation

## 2018-06-09 DIAGNOSIS — Z8261 Family history of arthritis: Secondary | ICD-10-CM | POA: Insufficient documentation

## 2018-06-09 DIAGNOSIS — Z832 Family history of diseases of the blood and blood-forming organs and certain disorders involving the immune mechanism: Secondary | ICD-10-CM | POA: Diagnosis not present

## 2018-06-09 DIAGNOSIS — O24419 Gestational diabetes mellitus in pregnancy, unspecified control: Secondary | ICD-10-CM | POA: Diagnosis not present

## 2018-06-09 LAB — URINALYSIS, ROUTINE W REFLEX MICROSCOPIC
Bilirubin Urine: NEGATIVE
Glucose, UA: NEGATIVE mg/dL
HGB URINE DIPSTICK: NEGATIVE
Ketones, ur: 80 mg/dL — AB
Nitrite: NEGATIVE
PROTEIN: 30 mg/dL — AB
SPECIFIC GRAVITY, URINE: 1.02 (ref 1.005–1.030)
pH: 5 (ref 5.0–8.0)

## 2018-06-09 MED ORDER — LACTATED RINGERS IV BOLUS
1000.0000 mL | Freq: Once | INTRAVENOUS | Status: AC
  Administered 2018-06-09: 1000 mL via INTRAVENOUS

## 2018-06-09 MED ORDER — LACTATED RINGERS IV BOLUS: 1000.0000 mL | Freq: Once | INTRAVENOUS | Status: DC

## 2018-06-09 MED ORDER — TERBUTALINE SULFATE 1 MG/ML IJ SOLN
0.2500 mg | INTRAMUSCULAR | Status: AC
  Administered 2018-06-09 (×2): 0.25 mg via SUBCUTANEOUS
  Filled 2018-06-09 (×2): qty 1

## 2018-06-09 MED ORDER — TERBUTALINE SULFATE 1 MG/ML IJ SOLN
0.2500 mg | Freq: Once | INTRAMUSCULAR | Status: AC
  Administered 2018-06-09: 0.25 mg via SUBCUTANEOUS
  Filled 2018-06-09: qty 1

## 2018-06-09 MED ORDER — LACTATED RINGERS IV SOLN
INTRAVENOUS | Status: DC
  Administered 2018-06-09: 03:00:00 via INTRAVENOUS

## 2018-06-09 NOTE — MAU Note (Signed)
Pt presents to MAU c/o ctx every 2-56min that started around 2230. Pt denies bleeding or LOF. Reports occasional FM. Pt reports lots of vaginal pressure and pins and needles in her vaginal area.

## 2018-06-09 NOTE — Discharge Instructions (Signed)
Braxton Hicks Contractions °Contractions of the uterus can occur throughout pregnancy, but they are not always a sign that you are in labor. You may have practice contractions called Braxton Hicks contractions. These false labor contractions are sometimes confused with true labor. °What are Braxton Hicks contractions? °Braxton Hicks contractions are tightening movements that occur in the muscles of the uterus before labor. Unlike true labor contractions, these contractions do not result in opening (dilation) and thinning of the cervix. Toward the end of pregnancy (32-34 weeks), Braxton Hicks contractions can happen more often and may become stronger. These contractions are sometimes difficult to tell apart from true labor because they can be very uncomfortable. You should not feel embarrassed if you go to the hospital with false labor. °Sometimes, the only way to tell if you are in true labor is for your health care provider to look for changes in the cervix. The health care provider will do a physical exam and may monitor your contractions. If you are not in true labor, the exam should show that your cervix is not dilating and your water has not broken. °If there are other health problems associated with your pregnancy, it is completely safe for you to be sent home with false labor. You may continue to have Braxton Hicks contractions until you go into true labor. °How to tell the difference between true labor and false labor °True labor °· Contractions last 30-70 seconds. °· Contractions become very regular. °· Discomfort is usually felt in the top of the uterus, and it spreads to the lower abdomen and low back. °· Contractions do not go away with walking. °· Contractions usually become more intense and increase in frequency. °· The cervix dilates and gets thinner. °False labor °· Contractions are usually shorter and not as strong as true labor contractions. °· Contractions are usually irregular. °· Contractions  are often felt in the front of the lower abdomen and in the groin. °· Contractions may go away when you walk around or change positions while lying down. °· Contractions get weaker and are shorter-lasting as time goes on. °· The cervix usually does not dilate or become thin. °Follow these instructions at home: °· Take over-the-counter and prescription medicines only as told by your health care provider. °· Keep up with your usual exercises and follow other instructions from your health care provider. °· Eat and drink lightly if you think you are going into labor. °· If Braxton Hicks contractions are making you uncomfortable: °? Change your position from lying down or resting to walking, or change from walking to resting. °? Sit and rest in a tub of warm water. °? Drink enough fluid to keep your urine pale yellow. Dehydration may cause these contractions. °? Do slow and deep breathing several times an hour. °· Keep all follow-up prenatal visits as told by your health care provider. This is important. °Contact a health care provider if: °· You have a fever. °· You have continuous pain in your abdomen. °Get help right away if: °· Your contractions become stronger, more regular, and closer together. °· You have fluid leaking or gushing from your vagina. °· You pass blood-tinged mucus (bloody show). °· You have bleeding from your vagina. °· You have low back pain that you never had before. °· You feel your baby’s head pushing down and causing pelvic pressure. °· Your baby is not moving inside you as much as it used to. °Summary °· Contractions that occur before labor are called Braxton   Hicks contractions, false labor, or practice contractions. °· Braxton Hicks contractions are usually shorter, weaker, farther apart, and less regular than true labor contractions. True labor contractions usually become progressively stronger and regular and they become more frequent. °· Manage discomfort from Braxton Hicks contractions by  changing position, resting in a warm bath, drinking plenty of water, or practicing deep breathing. °This information is not intended to replace advice given to you by your health care provider. Make sure you discuss any questions you have with your health care provider. °Document Released: 04/23/2017 Document Revised: 04/23/2017 Document Reviewed: 04/23/2017 °Elsevier Interactive Patient Education © 2018 [employer] ° °Fetal Movement Counts °Patient Name: ________________________________________________ Patient Due Date: ____________________ °What is a fetal movement count? °A fetal movement count is the number of times that you feel your baby move during a certain amount of time. This may also be called a fetal kick count. A fetal movement count is recommended for every pregnant woman. You may be asked to start counting fetal movements as early as week 28 of your pregnancy. °Pay attention to when your baby is most active. You may notice your baby's sleep and wake cycles. You may also notice things that make your baby move more. You should do a fetal movement count: °· When your baby is normally most active. °· At the same time each day. ° °A good time to count movements is while you are resting, after having something to eat and drink. °How do I count fetal movements? °1. Find a quiet, comfortable area. Sit, or lie down on your side. °2. Write down the date, the start time and stop time, and the number of movements that you felt between those two times. Take this information with you to your health care visits. °3. For 2 hours, count kicks, flutters, swishes, rolls, and jabs. You should feel at least 10 movements during 2 hours. °4. You may stop counting after you have felt 10 movements. °5. If you do not feel 10 movements in 2 hours, have something to eat and drink. Then, keep resting and counting for 1 hour. If you feel at least 4 movements during that hour, you may stop counting. °Contact a health care  provider if: °· You feel fewer than 4 movements in 2 hours. °· Your baby is not moving like he or she usually does. °Date: ____________ Start time: ____________ Stop time: ____________ Movements: ____________ °Date: ____________ Start time: ____________ Stop time: ____________ Movements: ____________ °Date: ____________ Start time: ____________ Stop time: ____________ Movements: ____________ °Date: ____________ Start time: ____________ Stop time: ____________ Movements: ____________ °Date: ____________ Start time: ____________ Stop time: ____________ Movements: ____________ °Date: ____________ Start time: ____________ Stop time: ____________ Movements: ____________ °Date: ____________ Start time: ____________ Stop time: ____________ Movements: ____________ °Date: ____________ Start time: ____________ Stop time: ____________ Movements: ____________ °Date: ____________ Start time: ____________ Stop time: ____________ Movements: ____________ °This information is not intended to replace advice given to you by your health care provider. Make sure you discuss any questions you have with your health care provider. °Document Released: 01/07/2007 Document Revised: 08/06/2016 Document Reviewed: 01/17/2016 °Elsevier Interactive Patient Education © 2018 [employer] ° °

## 2018-06-09 NOTE — MAU Provider Note (Signed)
History     CSN: [id number]  Arrival date and time: 06/09/18 3016   First Provider Initiated Contact with Patient 06/09/18 0122      Chief Complaint  Patient presents with  . Contractions  . Pelvic Pain   HPI  Ashley Casey is a 37 y.o. [id number] at [redacted]w[redacted]d who presents with contractions. Reports having intercourse this evening then went to work. While at work started having painful contractions and pelvic pressure. Contractions every 2-4 minutes since 2230. Rates pain 8/10 with contractions. Denies n/v/d, dysuria, vaginal bleeding, or LOF. Positive fetal movement. All other pregnancies delivered at term.   OB History    Gravida  10   Para  6   Term  6   Preterm      AB  3   Living  6     SAB  2   TAB  1   Ectopic      Multiple  0   Live Births  6           Past Medical History:  Diagnosis Date  . Abnormal Pap smear   . Anxiety   . Bell's palsy    after 5th pregnancy never resolved  . Cholelithiasis   . Fatigue   . Gestational diabetes    metformin  . Gestational diabetes mellitus, antepartum   . IBS (irritable bowel syndrome)   . Neuromuscular disorder [facility])     Past Surgical History:  Procedure Laterality Date  . 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 CHOLECYSTECTOMY  8/29/121  . THERAPEUTIC ABORTION    . WISDOM TOOTH EXTRACTION      Family History  Problem Relation Age of Onset  . Other Mother        sickle cell anemia  . Diabetes Mother   . Arthritis Brother   . Asthma Brother   . Diabetes Paternal Grandmother     Social History   Tobacco Use  . Smoking status: Never Smoker  . Smokeless tobacco: Never Used  Substance Use Topics  . Alcohol use: No  . Drug use: No    Allergies:  Allergies  Allergen Reactions  . 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    . Tegretol [Carbamazepine] Other (See Comments)    Reaction:  Seizures     Medications Prior to  Admission  Medication Sig Dispense Refill Last Dose  . acetaminophen (TYLENOL) 500 MG tablet Take 1,000 mg by mouth every 6 (six) hours as needed.   06/08/2018 at Unknown time  . cyclobenzaprine (FLEXERIL) 10 MG tablet Take 20 mg by mouth 3 (three) times daily as needed for muscle spasms.   06/08/2018 at Unknown time  . famotidine (PEPCID) 20 MG tablet Take 1 tablet (20 mg total) by mouth 2 (two) times daily. 10 tablet 0 Past Week at Unknown time  . metFORMIN (GLUCOPHAGE) 1000 MG tablet Take 1,000 mg by mouth 2 (two) times daily with a meal.   06/08/2018 at Unknown time  . ondansetron (ZOFRAN ODT) 4 MG disintegrating tablet Take 1 tablet (4 mg total) by mouth every 6 (six) hours as needed for nausea. 20 tablet 0 Past Month at Unknown time  . Prenat-FeCbn-FeAsp-Meth-FA-DHA (PRENATE MINI) 18-0.6-0.4-350 MG CAPS Take 1 capsule by mouth daily.   Past Week at Unknown time  . EPINEPHrine (EPIPEN 2-PAK) 0.3 mg/0.3 mL IJ SOAJ injection Inject 0.3 mLs (0.3 mg total) into the  muscle once as needed (for severe allergic reaction). CAll 911 immediately if you have to use this medicine 1 Device 1 More than a month at Unknown time  . potassium chloride SA (K-DUR,KLOR-CON) 20 MEQ tablet Take 2 tablets (40 mEq total) by mouth daily. 4 tablet 0   . promethazine (PHENERGAN) 25 MG tablet Take 0.5-1 tablets (12.5-25 mg total) by mouth every 6 (six) hours as needed. 30 tablet 2 More than a month at Unknown time    Review of Systems  Gastrointestinal: Positive for abdominal pain.  Genitourinary: Positive for pelvic pain. Negative for vaginal bleeding and vaginal discharge.   Physical Exam   Blood pressure (!) 82/44, pulse 88, temperature 97.7 F (36.5 C), temperature source Oral, last menstrual period 10/08/2017, unknown if currently breastfeeding.  Patient Vitals for the past 24 hrs:  BP Temp Temp src Pulse  06/09/18 0231 (!) 82/44 - - 88  06/09/18 0227 (!) 78/47 - - 88  06/09/18 0159 - 97.7 F (36.5 C) Oral -   06/09/18 0116 94/64 - - 93     Physical Exam  Nursing note and vitals reviewed. Constitutional: She is oriented to person, place, and time. She appears well-developed and well-nourished. No distress.  HENT:  Head: Normocephalic and atraumatic.  Eyes: Conjunctivae are normal. Right eye exhibits no discharge. Left eye exhibits no discharge. No scleral icterus.  Neck: Normal range of motion.  Respiratory: Effort normal. No respiratory distress.  GI: Soft. There is no tenderness.  Ctx palpate moderate with adequate resting tone  Genitourinary:  Genitourinary Comments: Dilation: 1 Effacement (%): Thick Cervical Position: Posterior Station: Ballotable Presentation: Vertex Exam by:: Almond Lint NP    Neurological: She is alert and oriented to person, place, and time.  Skin: Skin is warm and dry. She is not diaphoretic.  Psychiatric: She has a normal mood and affect. Her behavior is normal. Judgment and thought content normal.    MAU Course  Procedures Results for orders placed or performed during the hospital encounter of 06/09/18 (from the past 24 hour(s))  Urinalysis, Routine w reflex microscopic     Status: Abnormal   Collection Time: 06/09/18  3:21 AM  Result Value Ref Range   Color, Urine YELLOW YELLOW   APPearance CLEAR CLEAR   Specific Gravity, Urine 1.020 1.005 - 1.030   pH 5.0 5.0 - 8.0   Glucose, UA NEGATIVE NEGATIVE mg/dL   Hgb urine dipstick NEGATIVE NEGATIVE   Bilirubin Urine NEGATIVE NEGATIVE   Ketones, ur 80 (A) NEGATIVE mg/dL   Protein, ur 30 (A) NEGATIVE mg/dL   Nitrite NEGATIVE NEGATIVE   Leukocytes, UA TRACE (A) NEGATIVE   RBC / HPF 0-5 0 - 5 RBC/hpf   WBC, UA 0-5 0 - 5 WBC/hpf   Bacteria, UA RARE (A) NONE SEEN   Squamous Epithelial / LPF 0-5 0 - 5   Mucus PRESENT    Sperm, UA PRESENT     MDM NST:  Baseline: 140 bpm, Variability: Good {> 6 bpm), Accelerations: Reactive and Decelerations: see note below regarding tracing ?late vs prolonged decel  when pt placed on monitor followed by minimal variability. After monitoring patient tracing improved & reactive with no further decels.   Unable to collect FFN d/t recent IC. Cervix 1/thick/ballotable --- unchanged after 2+ hours of observation.   Ctx Q4-6 minutes. Visibly uncomfortable. IV fluid bolus. Unable to give procardia d/t low BPs. Terbutaline 0.25 SQ given. Reports no improvement in symptoms C/w Dr. Marvel Plan. Give additional 2 doses of  terbutaline 30 minutes apart.   Ctx resolved on monitor and pt reports fell asleep and doesn't remember last time she felt a ctx.  Dr. Marvel Plan updated with patient status. Ok to discharge home.   Assessment and Plan  A: 1. Preterm uterine contractions in third trimester, antepartum   2. [redacted] weeks gestation of pregnancy    P: Discharge home Discussed reasons to return to MAU F/u with OB as scheduled  Jorje Guild 06/09/2018, 1:23 AM

## 2018-06-28 LAB — OB RESULTS CONSOLE GBS: GBS: POSITIVE

## 2018-07-13 ENCOUNTER — Telehealth (HOSPITAL_COMMUNITY): Payer: Self-pay | Admitting: *Deleted

## 2018-07-13 ENCOUNTER — Encounter (HOSPITAL_COMMUNITY): Payer: Self-pay | Admitting: *Deleted

## 2018-07-13 NOTE — Telephone Encounter (Signed)
Preadmission screen  

## 2018-07-18 MED ORDER — SODIUM CHLORIDE 0.9 % IV SOLN
INTRAVENOUS | Status: DC
  Administered 2018-07-19: 0.7 [IU]/h via INTRAVENOUS
  Filled 2018-07-18 (×2): qty 1

## 2018-07-18 MED ORDER — DEXTROSE IN LACTATED RINGERS 5 % IV SOLN
INTRAVENOUS | Status: DC
  Administered 2018-07-19 (×2): via INTRAVENOUS
  Filled 2018-07-18: qty 1000

## 2018-07-19 ENCOUNTER — Inpatient Hospital Stay (HOSPITAL_COMMUNITY)
Admission: RE | Admit: 2018-07-19 | Discharge: 2018-07-21 | DRG: 807 | Disposition: A | Discharge status: Home / Self Care | Payer: Medicaid Other | Attending: Obstetrics and Gynecology | Admitting: Obstetrics and Gynecology

## 2018-07-19 ENCOUNTER — Inpatient Hospital Stay (HOSPITAL_COMMUNITY): Payer: Medicaid Other | Admitting: Anesthesiology

## 2018-07-19 ENCOUNTER — Encounter (HOSPITAL_COMMUNITY): Payer: Self-pay

## 2018-07-19 DIAGNOSIS — O3663X0 Maternal care for excessive fetal growth, third trimester, not applicable or unspecified: Secondary | ICD-10-CM | POA: Diagnosis present

## 2018-07-19 DIAGNOSIS — O9902 Anemia complicating childbirth: Secondary | ICD-10-CM | POA: Diagnosis present

## 2018-07-19 DIAGNOSIS — O99824 Streptococcus B carrier state complicating childbirth: Secondary | ICD-10-CM | POA: Diagnosis present

## 2018-07-19 DIAGNOSIS — D649 Anemia, unspecified: Secondary | ICD-10-CM | POA: Diagnosis present

## 2018-07-19 DIAGNOSIS — Z3A38 38 weeks gestation of pregnancy: Secondary | ICD-10-CM | POA: Diagnosis not present

## 2018-07-19 DIAGNOSIS — O24424 Gestational diabetes mellitus in childbirth, insulin controlled: Principal | ICD-10-CM | POA: Diagnosis present

## 2018-07-19 LAB — GLUCOSE, CAPILLARY
GLUCOSE-CAPILLARY: 129 mg/dL — AB (ref 70–99)
GLUCOSE-CAPILLARY: 75 mg/dL (ref 70–99)
GLUCOSE-CAPILLARY: 77 mg/dL (ref 70–99)
GLUCOSE-CAPILLARY: 81 mg/dL (ref 70–99)
GLUCOSE-CAPILLARY: 69 mg/dL — AB (ref 70–99)
Glucose-Capillary: 178 mg/dL — ABNORMAL HIGH (ref 70–99)
Glucose-Capillary: 84 mg/dL (ref 70–99)
Glucose-Capillary: 76 mg/dL (ref 70–99)
Glucose-Capillary: 91 mg/dL (ref 70–99)
Glucose-Capillary: 88 mg/dL (ref 70–99)
Glucose-Capillary: 90 mg/dL (ref 70–99)

## 2018-07-19 LAB — CBC
HCT: 29.6 % — ABNORMAL LOW (ref 36.0–46.0)
Hemoglobin: 9.5 g/dL — ABNORMAL LOW (ref 12.0–15.0)
MCH: 25.3 pg — ABNORMAL LOW (ref 26.0–34.0)
MCHC: 32.1 g/dL (ref 30.0–36.0)
MCV: 78.9 fL (ref 78.0–100.0)
PLATELETS: 177 10*3/uL (ref 150–400)
RBC: 3.75 MIL/uL — AB (ref 3.87–5.11)
RDW: 14.9 % (ref 11.5–15.5)
WBC: 7.1 10*3/uL (ref 4.0–10.5)

## 2018-07-19 LAB — TYPE AND SCREEN
ABO/RH(D): O POS
Antibody Screen: NEGATIVE

## 2018-07-19 LAB — RPR: RPR Ser Ql: NONREACTIVE

## 2018-07-19 MED ORDER — FENTANYL 2.5 MCG/ML BUPIVACAINE 1/10 % EPIDURAL INFUSION (WH - ANES)
14.0000 mL/h | INTRAMUSCULAR | Status: DC | PRN
  Administered 2018-07-19 (×2): 14 mL/h via EPIDURAL
  Filled 2018-07-19: qty 100

## 2018-07-19 MED ORDER — IBUPROFEN 600 MG PO TABS
600.0000 mg | ORAL_TABLET | Freq: Four times a day (QID) | ORAL | Status: DC
  Administered 2018-07-19 – 2018-07-21 (×7): 600 mg via ORAL
  Filled 2018-07-19 (×7): qty 1

## 2018-07-19 MED ORDER — PENICILLIN G POT IN DEXTROSE 60000 UNIT/ML IV SOLN
3.0000 10*6.[IU] | INTRAVENOUS | Status: DC
  Administered 2018-07-19 (×2): 3 10*6.[IU] via INTRAVENOUS
  Filled 2018-07-19 (×4): qty 50

## 2018-07-19 MED ORDER — TETANUS-DIPHTH-ACELL PERTUSSIS 5-2.5-18.5 LF-MCG/0.5 IM SUSP: 0.5000 mL | Freq: Once | INTRAMUSCULAR | Status: DC

## 2018-07-19 MED ORDER — METHYLERGONOVINE MALEATE 0.2 MG/ML IJ SOLN: 0.2000 mg | INTRAMUSCULAR | Status: DC | PRN

## 2018-07-19 MED ORDER — ZOLPIDEM TARTRATE 5 MG PO TABS: 5.0000 mg | ORAL_TABLET | Freq: Every evening | ORAL | Status: DC | PRN

## 2018-07-19 MED ORDER — LIDOCAINE HCL (PF) 1 % IJ SOLN
30.0000 mL | INTRAMUSCULAR | Status: DC | PRN
  Filled 2018-07-19: qty 30

## 2018-07-19 MED ORDER — OXYTOCIN 40 UNITS IN LACTATED RINGERS INFUSION - SIMPLE MED
1.0000 m[IU]/min | INTRAVENOUS | Status: DC
  Administered 2018-07-19: 2 m[IU]/min via INTRAVENOUS
  Filled 2018-07-19: qty 1000

## 2018-07-19 MED ORDER — SENNOSIDES-DOCUSATE SODIUM 8.6-50 MG PO TABS
2.0000 | ORAL_TABLET | ORAL | Status: DC
  Administered 2018-07-19 – 2018-07-20 (×2): 2 via ORAL
  Filled 2018-07-19 (×2): qty 2

## 2018-07-19 MED ORDER — ACETAMINOPHEN 325 MG PO TABS: 650.0000 mg | ORAL_TABLET | ORAL | Status: DC | PRN

## 2018-07-19 MED ORDER — COCONUT OIL OIL: 1.0000 "application " | TOPICAL_OIL | Status: DC | PRN

## 2018-07-19 MED ORDER — METHYLERGONOVINE MALEATE 0.2 MG PO TABS: 0.2000 mg | ORAL_TABLET | ORAL | Status: DC | PRN

## 2018-07-19 MED ORDER — LACTATED RINGERS IV SOLN
500.0000 mL | Freq: Once | INTRAVENOUS | Status: AC
  Administered 2018-07-19: 500 mL via INTRAVENOUS

## 2018-07-19 MED ORDER — OXYCODONE-ACETAMINOPHEN 5-325 MG PO TABS: 2.0000 | ORAL_TABLET | ORAL | Status: DC | PRN

## 2018-07-19 MED ORDER — PHENYLEPHRINE 40 MCG/ML (10ML) SYRINGE FOR IV PUSH (FOR BLOOD PRESSURE SUPPORT)
80.0000 ug | PREFILLED_SYRINGE | INTRAVENOUS | Status: DC | PRN
  Filled 2018-07-19: qty 5
  Filled 2018-07-19: qty 10

## 2018-07-19 MED ORDER — SODIUM CHLORIDE 0.9 % IV SOLN
5.0000 10*6.[IU] | Freq: Once | INTRAVENOUS | Status: AC
  Administered 2018-07-19: 5 10*6.[IU] via INTRAVENOUS
  Filled 2018-07-19: qty 5

## 2018-07-19 MED ORDER — LACTATED RINGERS IV SOLN: 500.0000 mL | INTRAVENOUS | Status: DC | PRN

## 2018-07-19 MED ORDER — DIBUCAINE 1 % RE OINT: 1.0000 "application " | TOPICAL_OINTMENT | RECTAL | Status: DC | PRN

## 2018-07-19 MED ORDER — MAGNESIUM HYDROXIDE 400 MG/5ML PO SUSP: 30.0000 mL | ORAL | Status: DC | PRN

## 2018-07-19 MED ORDER — ONDANSETRON HCL 4 MG/2ML IJ SOLN: 4.0000 mg | INTRAMUSCULAR | Status: DC | PRN

## 2018-07-19 MED ORDER — SIMETHICONE 80 MG PO CHEW: 80.0000 mg | CHEWABLE_TABLET | ORAL | Status: DC | PRN

## 2018-07-19 MED ORDER — SOD CITRATE-CITRIC ACID 500-334 MG/5ML PO SOLN: 30.0000 mL | ORAL | Status: DC | PRN

## 2018-07-19 MED ORDER — OXYCODONE-ACETAMINOPHEN 5-325 MG PO TABS: 1.0000 | ORAL_TABLET | ORAL | Status: DC | PRN

## 2018-07-19 MED ORDER — OXYTOCIN BOLUS FROM INFUSION
500.0000 mL | Freq: Once | INTRAVENOUS | Status: AC
  Administered 2018-07-19: 500 mL via INTRAVENOUS

## 2018-07-19 MED ORDER — DIPHENHYDRAMINE HCL 50 MG/ML IJ SOLN: 12.5000 mg | INTRAMUSCULAR | Status: DC | PRN

## 2018-07-19 MED ORDER — EPHEDRINE 5 MG/ML INJ
10.0000 mg | INTRAVENOUS | Status: DC | PRN
  Filled 2018-07-19: qty 2

## 2018-07-19 MED ORDER — PRENATAL MULTIVITAMIN CH
1.0000 | ORAL_TABLET | Freq: Every day | ORAL | Status: DC
  Administered 2018-07-20 – 2018-07-21 (×2): 1 via ORAL
  Filled 2018-07-19 (×2): qty 1

## 2018-07-19 MED ORDER — TERBUTALINE SULFATE 1 MG/ML IJ SOLN
0.2500 mg | Freq: Once | INTRAMUSCULAR | Status: DC | PRN
  Filled 2018-07-19: qty 1

## 2018-07-19 MED ORDER — DIPHENHYDRAMINE HCL 25 MG PO CAPS
25.0000 mg | ORAL_CAPSULE | Freq: Four times a day (QID) | ORAL | Status: DC | PRN
  Administered 2018-07-20 (×2): 25 mg via ORAL
  Filled 2018-07-19 (×2): qty 1

## 2018-07-19 MED ORDER — BUTORPHANOL TARTRATE 1 MG/ML IJ SOLN: 1.0000 mg | INTRAMUSCULAR | Status: DC | PRN

## 2018-07-19 MED ORDER — EPINEPHRINE (ANAPHYLAXIS) 1 MG/ML IJ SOLN: 0.3000 mg | Freq: Once | INTRAMUSCULAR | Status: DC | PRN

## 2018-07-19 MED ORDER — OXYCODONE HCL 5 MG PO TABS
5.0000 mg | ORAL_TABLET | ORAL | Status: DC | PRN
  Administered 2018-07-20 – 2018-07-21 (×3): 5 mg via ORAL
  Filled 2018-07-19 (×4): qty 1

## 2018-07-19 MED ORDER — BENZOCAINE-MENTHOL 20-0.5 % EX AERO
1.0000 "application " | INHALATION_SPRAY | CUTANEOUS | Status: DC | PRN
  Filled 2018-07-19: qty 56

## 2018-07-19 MED ORDER — ONDANSETRON HCL 4 MG PO TABS: 4.0000 mg | ORAL_TABLET | ORAL | Status: DC | PRN

## 2018-07-19 MED ORDER — PHENYLEPHRINE 40 MCG/ML (10ML) SYRINGE FOR IV PUSH (FOR BLOOD PRESSURE SUPPORT)
80.0000 ug | PREFILLED_SYRINGE | INTRAVENOUS | Status: DC | PRN
  Administered 2018-07-19 (×2): 80 ug via INTRAVENOUS
  Filled 2018-07-19: qty 5

## 2018-07-19 MED ORDER — LACTATED RINGERS IV SOLN
INTRAVENOUS | Status: DC
  Administered 2018-07-19 (×2): via INTRAVENOUS

## 2018-07-19 MED ORDER — OXYCODONE HCL 5 MG PO TABS: 10.0000 mg | ORAL_TABLET | ORAL | Status: DC | PRN

## 2018-07-19 MED ORDER — ACETAMINOPHEN 325 MG PO TABS
650.0000 mg | ORAL_TABLET | ORAL | Status: DC | PRN
  Administered 2018-07-19 – 2018-07-21 (×8): 650 mg via ORAL
  Filled 2018-07-19 (×8): qty 2

## 2018-07-19 MED ORDER — MEASLES, MUMPS & RUBELLA VAC ~~LOC~~ INJ
0.5000 mL | INJECTION | Freq: Once | SUBCUTANEOUS | Status: DC
  Filled 2018-07-19: qty 0.5

## 2018-07-19 MED ORDER — WITCH HAZEL-GLYCERIN EX PADS: 1.0000 "application " | MEDICATED_PAD | CUTANEOUS | Status: DC | PRN

## 2018-07-19 MED ORDER — OXYTOCIN 40 UNITS IN LACTATED RINGERS INFUSION - SIMPLE MED: 2.5000 [IU]/h | INTRAVENOUS | Status: DC

## 2018-07-19 MED ORDER — LIDOCAINE HCL (PF) 1 % IJ SOLN
INTRAMUSCULAR | Status: DC | PRN
  Administered 2018-07-19 (×2): 5 mL via EPIDURAL

## 2018-07-19 MED ORDER — ONDANSETRON HCL 4 MG/2ML IJ SOLN: 4.0000 mg | Freq: Four times a day (QID) | INTRAMUSCULAR | Status: DC | PRN

## 2018-07-19 NOTE — Progress Notes (Signed)
Ulyses Southward, RN Diabetes Specialty Coordinator called to verify set up of Glucose Stabilizer.

## 2018-07-19 NOTE — Anesthesia Pain Management Evaluation Note (Addendum)
  CRNA Pain Management Visit Note  Patient: Ashley Casey, 37 y.o., female  "Hello I am a member of the anesthesia team at [facility]. We have an anesthesia team available at all times to provide care throughout the hospital, including epidural management and anesthesia for C-section. I don't know your plan for the delivery whether it a natural birth, water birth, IV sedation, nitrous supplementation, doula or epidural, but we want to meet your pain goals."   1.Was your pain managed to your expectations on prior hospitalizations?   Yes 2.What is your expectation for pain management during this hospitalization?      epidural.    3.How can we help you reach that goal?                  Epidural as soon as patient reaches pain goal.   Record the patient's initial score and the patient's pain goal.   Pain: 0  Pain Goal: 7 [facility] wants you to be able to say your pain was always managed very well.  Morey Andonian 07/19/2018

## 2018-07-19 NOTE — Progress Notes (Signed)
Comfortable with epidural Afeb, VSS CBG all normal FHT- Cat I, ctx q 3 min VE-4/80/-1, vtx, AROM clear Continue pitocin and PCN, monitor progress, anticipate SVD, continue close CBG monitoring as well

## 2018-07-19 NOTE — Anesthesia Preprocedure Evaluation (Signed)
Anesthesia Evaluation  Patient identified by MRN, date of birth, ID band Patient awake    Reviewed: Allergy & Precautions, H&P , NPO status , Patient's Chart, lab work & pertinent test results  History of Anesthesia Complications Negative for: history of anesthetic complications  Airway Mallampati: II  TM Distance: >3 FB Neck ROM: full    Dental no notable dental hx. (+) Teeth Intact   Pulmonary neg pulmonary ROS,    Pulmonary exam normal breath sounds clear to auscultation       Cardiovascular negative cardio ROS Normal cardiovascular exam Rhythm:regular Rate:Normal     Neuro/Psych Anxiety negative neurological ROS     GI/Hepatic Neg liver ROS, IBS (irritable bowel syndrome)   Endo/Other  diabetes, Gestational, Insulin Dependent, Oral Hypoglycemic Agents  Renal/GU negative Renal ROS  negative genitourinary   Musculoskeletal   Abdominal (+) + obese,   Peds  Hematology  (+) anemia ,   Anesthesia Other Findings   Reproductive/Obstetrics (+) Pregnancy                             Anesthesia Physical Anesthesia Plan  ASA: III  Anesthesia Plan: Epidural   Post-op Pain Management:    Induction:   PONV Risk Score and Plan:   Airway Management Planned:   Additional Equipment:   Intra-op Plan:   Post-operative Plan:   Informed Consent: I have reviewed the patients History and Physical, chart, labs and discussed the procedure including the risks, benefits and alternatives for the proposed anesthesia with the patient or authorized representative who has indicated his/her understanding and acceptance.     Plan Discussed with:   Anesthesia Plan Comments:         Anesthesia Quick Evaluation

## 2018-07-19 NOTE — Anesthesia Procedure Notes (Signed)
Epidural Patient location during procedure: OB Start time: 07/19/2018 11:04 AM End time: 07/19/2018 11:14 AM  Staffing Anesthesiologist: Murvin Natal, MD Performed: anesthesiologist   Preanesthetic Checklist Completed: patient identified, site marked, pre-op evaluation, timeout performed, IV checked, risks and benefits discussed and monitors and equipment checked  Epidural Patient position: sitting Prep: DuraPrep Patient monitoring: heart rate, cardiac monitor, continuous pulse ox and blood pressure Approach: midline Location: L4-L5 Injection technique: LOR air  Needle:  Needle type: Tuohy  Needle gauge: 17 G Needle length: 9 cm Needle insertion depth: 6 cm Catheter type: closed end flexible Catheter size: 19 Gauge Catheter at skin depth: 11 cm Test dose: negative and Other  Assessment Events: blood not aspirated, injection not painful, no injection resistance and negative IV test  Additional Notes Informed consent obtained prior to proceeding including risk of failure, 1% risk of PDPH, risk of minor discomfort and bruising. Discussed alternatives to epidural analgesia and patient desires to proceed.  Timeout performed pre-procedure verifying patient name, procedure, and platelet count.  Patient tolerated procedure well. Reason for block:procedure for pain

## 2018-07-19 NOTE — H&P (Signed)
Ashley Casey is a 37 y.o. female, G10, P6036, EGA 38+ weeks with EDC 8-9 presenting for induction due to suboptimally controlled A2GDM.  Prenatal care complicated by N0UVO treated with insulin, has recently had low FBS but still some slightly elevated pp CBG, she did not take her insulin for the past 2 days.  Reactive NSTs. Ultrasound on 7-5 with EFW 3800 gms, AFI 21.  She has a previous SVD of 9lb 9oz GDM Casey.  I have offered her a c-section to reduce the risk of shoulder dystocia and she declines.  OB History    Gravida  10   Para  6   Term  6   Preterm      AB  3   Living  6     SAB  2   TAB  1   Ectopic      Multiple  0   Live Births  6          Past Medical History:  Diagnosis Date  . Abnormal Pap smear   . Anxiety    in past  . Bell's palsy    after 5th pregnancy never resolved  . Cholelithiasis   . Fatigue   . Gestational diabetes    metformin  . Gestational diabetes mellitus, antepartum   . History of shingles   . IBS (irritable bowel syndrome)   . Neuromuscular disorder Ashley Casey)    Past Surgical History:  Procedure Laterality Date  . 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 CHOLECYSTECTOMY  8/29/121  . THERAPEUTIC ABORTION    . WISDOM TOOTH EXTRACTION     Family History: family history includes Arthritis in her brother; Asthma in her brother; Diabetes in her mother and paternal grandmother; Other in her mother. Social History:  reports that she has never smoked. She has never used smokeless tobacco. She reports that she does not drink alcohol or use drugs.     Maternal Diabetes: Yes:  Diabetes Type:  Insulin/Medication controlled Genetic Screening: Declined Maternal Ultrasounds/Referrals: Normal Fetal Ultrasounds or other Referrals:  None Maternal Substance Abuse:  No Significant Maternal Medications:  Meds include: Other:  Significant Maternal Lab Results:  Lab values include: Group B Strep positive Other Comments:  on Lantus insulin for  GDM  Review of Systems  Respiratory: Negative.   Cardiovascular: Negative.    Maternal Medical History:  Contractions: Perceived severity is mild.    Fetal activity: Perceived fetal activity is normal.    Prenatal Complications - Diabetes: gestational. Diabetes is managed by insulin injections and diet.      Dilation: 1.5 Effacement (%): 30 Station: -3 Exam by:: Dr. Willis Modena Height 5\' 4"  (1.626 m), weight 88 kg (194 lb), last menstrual period 10/08/2017, unknown if currently breastfeeding. Maternal Exam:  Uterine Assessment: Contraction strength is mild.  Contraction frequency is irregular.   Abdomen: Patient reports no abdominal tenderness. Estimated fetal weight is 9 lbs.   Fetal presentation: vertex  Introitus: Normal vulva. Normal vagina.  Amniotic fluid character: not assessed.  Pelvis: adequate for delivery.   Cervix: Cervix evaluated by digital exam.     Fetal Exam Fetal Monitor Review: Mode: ultrasound.   Baseline rate: 140.  Variability: moderate (6-25 bpm).   Pattern: accelerations present and no decelerations.    Fetal State Assessment: Category I - tracings are normal.     Physical Exam  Vitals reviewed. Constitutional: She appears well-developed and well-nourished.  Cardiovascular: Normal rate and regular rhythm.  Respiratory: Effort normal. No  respiratory distress.  GI: Soft.    Prenatal labs: ABO, Rh: O/Positive/-- (01/25 0000) Antibody: Negative (01/25 0000) Rubella: Immune (01/25 0000) RPR: Nonreactive (01/25 0000)  HBsAg: Negative (01/25 0000)  HIV: Non-reactive (01/25 0000)  GBS: Positive (07/08 0000)   Assessment/Plan: IUP at 38+ weeks with suboptimally controlled GDM with Lantus insulin, +GBS.  Casey is also LGA, she declines c-section to reduce risk of shoulder dystocia, has delivered Ashley Casey previously.  Will start pitocin, insulin gtt and monitor CBG closely, PCN for +GBS.  Will monitor progress, will not assist vaginal  delivery.   Ashley Casey 07/19/2018, 8:15 AM

## 2018-07-20 NOTE — Anesthesia Postprocedure Evaluation (Signed)
Anesthesia Post Note  Patient: Rosalynn A Tortora  Procedure(s) Performed: AN AD HOC LABOR EPIDURAL     Patient location during evaluation: Mother Baby Anesthesia Type: Epidural Level of consciousness: awake and alert Pain management: pain level controlled Vital Signs Assessment: post-procedure vital signs reviewed and stable Respiratory status: spontaneous breathing, nonlabored ventilation and respiratory function stable Cardiovascular status: stable Postop Assessment: no headache, no backache, epidural receding, able to ambulate, adequate PO intake, no apparent nausea or vomiting and patient able to bend at knees Anesthetic complications: no    Last Vitals:  Vitals:   07/20/18 0518 07/20/18 0815  BP: 100/73 101/75  Pulse: 80 68  Resp: 16 18  Temp: 36.5 C 36.6 C  SpO2: 100%     Last Pain:  Vitals:   07/20/18 0815  TempSrc: Oral  PainSc: 3    Pain Goal: Patients Stated Pain Goal: Other (Comment) (07/20/18 0815)               Jabier Mutton

## 2018-07-20 NOTE — Progress Notes (Signed)
Patient ID: Ashley Casey, female   DOB: 1981/12/05, 37 y.o.   MRN: [id number] PPD#1 Pt doing well. Reports crampy abdominal and back pain - had to take a percocet and that helped. Lcohia mild. No fever or chills. Ambulating a bit. Voiding well. Bonding well with baby breastfeeding VSS ABD - FF EXT - no homans All FSBS wnl  A/P: PPD#1 s/p svd - stable         Routine pp care         Likely discharge home in am

## 2018-07-20 NOTE — Lactation Note (Signed)
This note was copied from a baby's chart. Lactation Consultation Note:  Mother is a P7, she reports that she breastfed all of her children from 45-months to 4 years.  Mother reports that this infant is feeding well. He is 59 hours old and has had 7 feedings.  Infant is sleeping STS on mother chest.   Mother denies having any concerns or questions about breastfeeding.  Mother reports that she is active with [facility].  Lactation Brochure given with review of basic breastfeeding . Mother reports that she is able to  hand express colostrum.   Mother informed of all [facility] services and [facility], out patient dept and phone line for breastfeeding  concerns or questions.   Patient Name: Ashley Casey [id number] Date: 07/20/2018 Reason for consult: Follow-up assessment   Maternal Data Has patient been taught Hand Expression?: Yes Does the patient have breastfeeding experience prior to this delivery?: Yes  Feeding Feeding Type: Breast Fed Length of feed: 25 min  LATCH Score Latch: Grasps breast easily, tongue down, lips flanged, rhythmical sucking.  Audible Swallowing: A few with stimulation  Type of Nipple: Everted at rest and after stimulation  Comfort (Breast/Nipple): Soft / non-tender  Hold (Positioning): No assistance needed to correctly position infant at breast.  LATCH Score: 9  Interventions Interventions: Expressed milk  Lactation Tools Discussed/Used     Consult Status Consult Status: Follow-up Date: 07/21/18 Follow-up type: In-patient    Jess Barters [facility] 07/20/2018, 2:36 PM

## 2018-07-20 NOTE — Progress Notes (Signed)
Mother consents to [facility] interview.

## 2018-07-20 NOTE — Progress Notes (Signed)
MOB was referred for history of depression/anxiety. * Referral screened out by Clinical Social Worker because none of the following criteria appear to apply: ~ History of anxiety/depression during this pregnancy, or of post-partum depression. ~ Diagnosis of anxiety and/or depression within last 3 years OR * MOB's symptoms currently being treated with medication and/or therapy. Please contact the Clinical Social Worker if needs arise, by MOB request, or if MOB scores greater than 9/yes to question 10 on [place] Postpartum Depression Screen.  Nou Chard Boyd-Gilyard, MSW, LCSW Clinical Social Work [phone] 

## 2018-07-20 NOTE — Progress Notes (Signed)
Pt agrees to be interviewed by [employer].

## 2018-07-21 MED ORDER — IBUPROFEN 800 MG PO TABS: 800.0000 mg | ORAL_TABLET | Freq: Three times a day (TID) | ORAL | 1 refills | Status: DC | PRN

## 2018-07-21 MED ORDER — IBUPROFEN 600 MG PO TABS: 600.0000 mg | ORAL_TABLET | Freq: Four times a day (QID) | ORAL | 0 refills | Status: DC

## 2018-07-21 MED ORDER — OXYCODONE HCL 5 MG PO TABS: 5.0000 mg | ORAL_TABLET | ORAL | 0 refills | Status: DC | PRN

## 2018-07-21 NOTE — Lactation Note (Signed)
This note was copied from a baby's chart. Lactation Consultation Note: Mother sitting up on sofa with infant in cradle hold. Mother assist with breast compression. Observed infant with frequent suckles and swallows after breast compression.  Advised mother of using pillow support and bringing infant closer to breast.   Mother reports that infant is cluster feeding. Advised mother to unwrap infant and do STS when feeding. Informed mother that cluster feeding will last a few more days.   Mother reports that she was told by Medicaid that she would get an electric pump from the hospital. Mother is active with [facility]. She has an appt in August for follow up. Inquired about why she felt that she needed a electric pump. Mother reports that she may get engorged.   Advised mother to breastfeed infant well and keep breast drained.  Informed mother to do good breast massage and ice to prevent engorgement.  Mother advised to follow up with [facility] to talk with Peer counselor about a pump.   Mother was given a harmony hand pump with instructions to use. She was given a #27 and #30 flange. She will try and see which one feels better.  Encouraged mother to continue to cue base feed and feed at least 8-12 times in 24 hours. Mother is aware of available [place] services and community support for any breastfeeding concerns or problems.    Patient Name: Boy Donne Robillard [id number] Date: 07/21/2018 Reason for consult: Follow-up assessment   Maternal Data    Feeding Feeding Type: Breast Fed Length of feed: 25 min(infant still breastfeeding when left the room)  LATCH Score Latch: Grasps breast easily, tongue down, lips flanged, rhythmical sucking.  Audible Swallowing: Spontaneous and intermittent  Type of Nipple: Everted at rest and after stimulation  Comfort (Breast/Nipple): Soft / non-tender  Hold (Positioning): Assistance needed to correctly position infant at breast and maintain latch.  LATCH Score:  9  Interventions Interventions: Breast compression;Adjust position;Support pillows;Position options;Hand pump  Lactation Tools Discussed/Used     Consult Status Consult Status: Complete    Darla Lesches 07/21/2018, 10:25 AM

## 2018-07-21 NOTE — Progress Notes (Signed)
Post Partum Day 2 Subjective: no complaints, up ad lib and tolerating PO  Objective: Blood pressure 98/66, pulse 76, temperature 97.8 F (36.6 C), temperature source Oral, resp. rate 18, height 5\' 4"  (1.626 m), weight 88 kg (194 lb), last menstrual period 10/08/2017, SpO2 97 %, unknown if currently breastfeeding.  Physical Exam:  General: alert and cooperative Lochia: appropriate Uterine Fundus: firm   Recent Labs    07/19/18 0744  HGB 9.5*  HCT 29.6*    Assessment/Plan: Discharge home  Circumcision in office    LOS: 2 days   Logan Bores 07/21/2018, 9:20 AM

## 2018-07-21 NOTE — Discharge Summary (Signed)
OB Discharge Summary     Patient Name: Ashley Casey DOB: 1981/03/01 MRN: [id number]  Date of admission: 07/19/2018 Delivering MD: Willis Modena, TODD   Date of discharge: 07/21/2018  Admitting diagnosis: INDUCTION Intrauterine pregnancy: [redacted]w[redacted]d     Secondary diagnosis:  Active Problems:   Indication for care in labor or delivery  Additional problems: Gestational diabetes                                      LGA infant     Discharge diagnosis: Term Pregnancy Delivered                                                                                                Post partum procedures:none  Augmentation: AROM and Pitocin  Complications: None  Hospital course:  Induction of Labor With Vaginal Delivery   37 y.o. yo [id number] at [redacted]w[redacted]d was admitted to the hospital 07/19/2018 for induction of labor.  Indication for induction: A2 DM.  Patient had an uncomplicated labor course as follows: Membrane Rupture Time/Date: 1:11 PM ,07/19/2018   Intrapartum Procedures: Episiotomy: None [1]                                         Lacerations:  None [1]  Patient had delivery of a Viable infant.  Information for the patient's newborn:  Ijeoma, Loor [[id number]]  Delivery Method: Vag-Spont   07/19/2018  Details of delivery can be found in separate delivery note.  Patient had a routine postpartum course. Patient is discharged home 07/21/18.  Physical exam  Vitals:   07/20/18 0815 07/20/18 1421 07/20/18 2115 07/21/18 0538  BP: 101/75 109/82 110/86 98/66  Pulse: 68 80 77 76  Resp: 18 18 18 18   Temp: 97.8 F (36.6 C) (!) 97.5 F (36.4 C) 98.1 F (36.7 C) 97.8 F (36.6 C)  TempSrc: Oral Oral  Oral  SpO2:  100% 97%   Weight:      Height:       General: alert and cooperative Lochia: appropriate Uterine Fundus: firm  Labs: Lab Results  Component Value Date   WBC 7.1 07/19/2018   HGB 9.5 (L) 07/19/2018   HCT 29.6 (L) 07/19/2018   MCV 78.9 07/19/2018   PLT 177 07/19/2018   CMP  Latest Ref Rng & Units 02/24/2018  Glucose 65 - 99 mg/dL 138(H)  BUN 6 - 20 mg/dL 8  Creatinine 0.44 - 1.00 mg/dL 0.45  Sodium 135 - 145 mmol/L 131(L)  Potassium 3.5 - 5.1 mmol/L 2.8(L)  Chloride 101 - 111 mmol/L 103  CO2 22 - 32 mmol/L 19(L)  Calcium 8.9 - 10.3 mg/dL 8.5(L)  Total Protein 6.5 - 8.1 g/dL 7.3  Total Bilirubin 0.3 - 1.2 mg/dL 1.0  Alkaline Phos 38 - 126 U/L 69  AST 15 - 41 U/L 18  ALT 14 - 54 U/L 14    Discharge instruction: per After Visit  Summary and "Baby and Me Booklet".  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     Tegretol [carbamazepine] Other (See Comments)   Reaction:  Seizures       Medication List    STOP taking these medications   cyclobenzaprine 10 MG tablet Commonly known as:  FLEXERIL   hydrOXYzine 25 MG tablet Commonly known as:  ATARAX/VISTARIL   LANTUS 100 UNIT/ML injection Generic drug:  insulin glargine   ondansetron 4 MG disintegrating tablet Commonly known as:  ZOFRAN ODT   promethazine 25 MG tablet Commonly known as:  PHENERGAN   ROLAIDS PO     TAKE these medications   acetaminophen 500 MG tablet Commonly known as:  TYLENOL Take 1,000 mg by mouth every 6 (six) hours as needed for moderate pain.   EPINEPHrine 0.3 mg/0.3 mL Soaj injection Commonly known as:  EPIPEN 2-PAK Inject 0.3 mLs (0.3 mg total) into the muscle once as needed (for severe allergic reaction). CAll 911 immediately if you have to use this medicine   hydrocortisone cream 1 % Apply 1 application topically 2 (two) times daily as needed for itching.   ibuprofen 600 MG tablet Commonly known as:  ADVIL,MOTRIN Take 1 tablet (600 mg total) by mouth every 6 (six) hours.   oxyCODONE 5 MG immediate release tablet Commonly known as:  Oxy IR/ROXICODONE Take 1 tablet (5 mg total) by mouth every 4 (four) hours as needed (pain scale 4-7).    PRENATE MINI 18-0.6-0.4-350 MG Caps Take 1 capsule by mouth daily.       Diet: routine diet  Activity: Advance as tolerated. Pelvic rest for 6 weeks.   Outpatient follow up:6 weeks Follow up Appt:No future appointments. Follow up Visit:No follow-ups on file.  Postpartum contraception: Undecided  Newborn Data: Live born female  Birth Weight: 9 lb 8.7 oz (4330 g) APGAR: 67, 9  Newborn Delivery   Birth date/time:  07/19/2018 18:03:00 Delivery type:  Vaginal, Spontaneous     Baby Feeding: Breast Disposition:home with mother  Planning circumcision in office   07/21/2018 Logan Bores, MD

## 2019-02-15 ENCOUNTER — Ambulatory Visit
Admission: EM | Admit: 2019-02-15 | Discharge: 2019-02-15 | Disposition: A | Discharge status: Home / Self Care | Payer: Medicaid Other | Attending: Physician Assistant | Admitting: Physician Assistant

## 2019-02-15 DIAGNOSIS — R52 Pain, unspecified: Secondary | ICD-10-CM

## 2019-02-15 DIAGNOSIS — J3489 Other specified disorders of nose and nasal sinuses: Secondary | ICD-10-CM | POA: Diagnosis not present

## 2019-02-15 DIAGNOSIS — R112 Nausea with vomiting, unspecified: Secondary | ICD-10-CM | POA: Diagnosis not present

## 2019-02-15 DIAGNOSIS — R197 Diarrhea, unspecified: Secondary | ICD-10-CM

## 2019-02-15 LAB — POCT INFLUENZA A/B
INFLUENZA A, POC: NEGATIVE
INFLUENZA B, POC: NEGATIVE

## 2019-02-15 MED ORDER — ONDANSETRON 4 MG PO TBDP: 4.0000 mg | ORAL_TABLET | Freq: Three times a day (TID) | ORAL | 0 refills | Status: DC | PRN

## 2019-02-15 MED ORDER — IBUPROFEN 800 MG PO TABS: 800.0000 mg | ORAL_TABLET | Freq: Three times a day (TID) | ORAL | 0 refills | Status: DC

## 2019-02-15 MED ORDER — FLUTICASONE PROPIONATE 50 MCG/ACT NA SUSP: 2.0000 | Freq: Every day | NASAL | 0 refills | Status: DC

## 2019-02-15 NOTE — Discharge Instructions (Signed)
No alarming signs on exam.  Your flu testing was negative.  I have called in some Zofran to help with nausea and vomiting.  Flonase as directed for nasal congestion/drainage. Keep hydrated, urine should be clear to pale yellow in color. Bland diet, advance as tolerated.  If experiencing worsening symptoms, nausea/vomiting not controlled by medication, one-sided weakness, dizziness, confusion/altered mental status, chest pain, shortness of breath, go to the emergency department for further evaluation.

## 2019-02-15 NOTE — ED Triage Notes (Signed)
Pt c/o headaches, muscle spasms, nasal congestion, fever, body aches

## 2019-02-15 NOTE — ED Provider Notes (Signed)
[name]    CSN: [id number] Arrival date & time: 02/15/19  1726     History   Chief Complaint Chief Complaint  Patient presents with  . Influenza    HPI Ashley Casey is a 38 y.o. female.   38 year old female comes in with 2-day history of flulike symptoms.  Has had alternating hot and cold chills, body aches, nasal congestion, rhinorrhea.  Denies cough.  States last night, went to have a bowel movement at work, and had diarrhea.  When she was standing up, got hot and cold chills, and had a syncopal episode.  Unknown amount of time with loss of consciousness.  When she woke up, was able to ambulate on own.  She denies vomiting, dizziness, confusion.  She feels generalized weakness.  She has had Bell's palsy in the past with non-resolving left facial drooping.  Patient states this is at baseline, and has not had worsening symptoms since syncope.  She denies chest pain, shortness of breath, wheezing.  Has not taken anything for the symptoms.  Positive sick contact.  Currently breast-feeding.     Past Medical History:  Diagnosis Date  . Abnormal Pap smear   . Anxiety    in past  . Bell's palsy    after 5th pregnancy never resolved  . Cholelithiasis   . Fatigue   . Gestational diabetes    metformin  . Gestational diabetes mellitus, antepartum   . History of shingles   . IBS (irritable bowel syndrome)   . Neuromuscular disorder [facility])     Patient Active Problem List   Diagnosis Date Noted  . Indication for care in labor or delivery 07/19/2018  . Impaired glucose tolerance test 03/04/2018  . Term pregnancy 03/06/2017  . Postpartum care following vaginal delivery 03/06/2017  . Normal pregnancy in third trimester 11/18/2014  . Other normal pregnancy, not first 11/18/2014  . SVD (spontaneous vaginal delivery) 11/18/2014  . Decreased fetal movement   . [redacted] weeks gestation of pregnancy   . Dependency on pain medication ([place]) 10/17/2013  . Symptomatic  cholelithiasis 07/28/2011    Past Surgical History:  Procedure Laterality Date  . 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 CHOLECYSTECTOMY  8/29/121  . THERAPEUTIC ABORTION    . WISDOM TOOTH EXTRACTION      OB History    Gravida  10   Para  7   Term  7   Preterm      AB  3   Living  7     SAB  2   TAB  1   Ectopic      Multiple  0   Live Births  7            Home Medications    Prior to Admission medications   Medication Sig Start Date End Date Taking? Authorizing Provider  acetaminophen (TYLENOL) 500 MG tablet Take 1,000 mg by mouth every 6 (six) hours as needed for moderate pain.     [provider]  EPINEPHrine (EPIPEN 2-PAK) 0.3 mg/0.3 mL IJ SOAJ injection Inject 0.3 mLs (0.3 mg total) into the muscle once as needed (for severe allergic reaction). CAll 911 immediately if you have to use this medicine 11/28/16   Pisciotta, Elmyra Ricks, PA-C  fluticasone [facility]) 50 MCG/ACT nasal spray Place 2 sprays into both nostrils daily. 02/15/19   Tasia Catchings, Levoy Geisen V, PA-C  hydrocortisone cream 1 % Apply 1 application topically 2 (two) times daily as needed for itching.  [provider]  ibuprofen (ADVIL,MOTRIN) 800 MG tablet Take 1 tablet (800 mg total) by mouth 3 (three) times daily. 02/15/19   Tasia Catchings, Juanna Pudlo V, PA-C  ondansetron (ZOFRAN ODT) 4 MG disintegrating tablet Take 1 tablet (4 mg total) by mouth every 8 (eight) hours as needed for nausea or vomiting. 02/15/19   Tasia Catchings, Kiko Ripp V, PA-C  oxyCODONE (OXY IR/ROXICODONE) 5 MG immediate release tablet Take 1 tablet (5 mg total) by mouth every 4 (four) hours as needed (pain scale 4-7). 07/21/18   Paula Compton, MD  Prenat-FeCbn-FeAsp-Meth-FA-DHA (PRENATE MINI) 18-0.6-0.4-350 MG CAPS Take 1 capsule by mouth daily.    [provider]    Family History Family History  Problem Relation Age of Onset  . Other Mother        sickle cell anemia  . Diabetes Mother   . Arthritis Brother   . Asthma Brother   . Diabetes  Paternal Grandmother     Social History Social History   Tobacco Use  . Smoking status: Never Smoker  . Smokeless tobacco: Never Used  Substance Use Topics  . Alcohol use: No  . Drug use: No     Allergies   Food and Tegretol [carbamazepine]   Review of Systems Review of Systems  Reason unable to perform ROS: See HPI as above.     Physical Exam Triage Vital Signs ED Triage Vitals  Enc Vitals Group     BP 02/15/19 1740 (!) 130/92     Pulse Rate 02/15/19 1740 97     Resp 02/15/19 1740 18     Temp 02/15/19 1740 98.1 F (36.7 C)     Temp Source 02/15/19 1740 Oral     SpO2 02/15/19 1740 98 %     Weight --      Height --      Head Circumference --      Peak Flow --      Pain Score 02/15/19 1741 5     Pain Loc --      Pain Edu? --      Excl. in [place]? --    No data found.  Updated Vital Signs BP (!) 130/92 (BP Location: Left Arm)   Pulse 97   Temp 98.1 F (36.7 C) (Oral)   Resp 18   SpO2 98%   Physical Exam Constitutional:      General: She is not in acute distress.    Appearance: She is well-developed. She is not ill-appearing, toxic-appearing or diaphoretic.  HENT:     Head: Normocephalic and atraumatic.     Right Ear: Tympanic membrane, ear canal and external ear normal. Tympanic membrane is not erythematous or bulging.     Left Ear: Tympanic membrane, ear canal and external ear normal. Tympanic membrane is not erythematous or bulging.     Nose: Rhinorrhea present.     Right Sinus: No maxillary sinus tenderness or frontal sinus tenderness.     Left Sinus: No maxillary sinus tenderness or frontal sinus tenderness.     Mouth/Throat:     Mouth: Mucous membranes are moist.     Pharynx: Oropharynx is clear. Uvula midline.  Eyes:     Conjunctiva/sclera: Conjunctivae normal.     Pupils: Pupils are equal, round, and reactive to light.  Neck:     Musculoskeletal: Normal range of motion and neck supple.  Cardiovascular:     Rate and Rhythm: Normal rate and  regular rhythm.     Heart sounds: Normal heart sounds.  No murmur. No friction rub. No gallop.   Pulmonary:     Effort: Pulmonary effort is normal. No accessory muscle usage, prolonged expiration, respiratory distress or retractions.     Breath sounds: Normal breath sounds. No stridor, decreased air movement or transmitted upper airway sounds. No decreased breath sounds, wheezing, rhonchi or rales.  Skin:    General: Skin is warm and dry.  Neurological:     Mental Status: She is alert and oriented to person, place, and time.     GCS: GCS eye subscore is 4. GCS verbal subscore is 5. GCS motor subscore is 6.     Coordination: Coordination is intact.     Gait: Gait is intact.     Comments: Slightly drooping left eyebrow, eyelid, lip, which is at baseline per patient.  No other cranial nerve defect.  Sensation intact and equal bilaterally.  Strength 5 out of 5 for upper and lower extremities.      UC Treatments / Results  Labs (all labs ordered are listed, but only abnormal results are displayed) Labs Reviewed  POCT INFLUENZA A/B - Normal    EKG None  Radiology No results found.  Procedures Procedures (including critical care time)  Medications Ordered in UC Medications - No data to display  Initial Impression / Assessment and Plan / UC Course  I have reviewed the triage vital signs and the nursing notes.  Pertinent labs & imaging results that were available during my care of the patient were reviewed by me and considered in my medical decision making (see chart for details).    Flu test negative.  No alarming signs on exam.  Patient with slight droop to the left face, that is at baseline due to history of Bell's palsy.  Otherwise neurology exam grossly intact.  She does not have any dizziness, lightheadedness at this time.  Denies chest pain, shortness of breath.  No further syncopal episode.  Will provide symptomatic treatment.  Push fluids.  Return precautions given.  Patient  expresses understanding and agrees to plan.  Final Clinical Impressions(s) / UC Diagnoses   Final diagnoses:  Nausea vomiting and diarrhea  Body aches  Sinus pressure    ED Prescriptions    Medication Sig Dispense Auth. Provider   ondansetron (ZOFRAN ODT) 4 MG disintegrating tablet Take 1 tablet (4 mg total) by mouth every 8 (eight) hours as needed for nausea or vomiting. 15 tablet Alecia Doi V, PA-C   fluticasone (FLONASE) 50 MCG/ACT nasal spray Place 2 sprays into both nostrils daily. 1 g Marrissa Dai V, PA-C   ibuprofen (ADVIL,MOTRIN) 800 MG tablet Take 1 tablet (800 mg total) by mouth 3 (three) times daily. 30 tablet Tobin Chad, [place] 02/15/19 1904

## 2019-05-16 ENCOUNTER — Ambulatory Visit
Admission: EM | Admit: 2019-05-16 | Discharge: 2019-05-16 | Disposition: A | Discharge status: Home / Self Care | Payer: Medicaid Other

## 2019-05-16 ENCOUNTER — Encounter: Payer: Self-pay | Admitting: Emergency Medicine

## 2019-05-16 ENCOUNTER — Other Ambulatory Visit: Payer: Self-pay

## 2019-05-16 DIAGNOSIS — F411 Generalized anxiety disorder: Secondary | ICD-10-CM

## 2019-05-16 DIAGNOSIS — F430 Acute stress reaction: Secondary | ICD-10-CM

## 2019-05-16 MED ORDER — HYDROXYZINE HCL 50 MG PO TABS: 100.0000 mg | ORAL_TABLET | Freq: Four times a day (QID) | ORAL | 0 refills | Status: AC

## 2019-05-16 MED ORDER — CLONAZEPAM 0.5 MG PO TABS: 0.5000 mg | ORAL_TABLET | Freq: Two times a day (BID) | ORAL | 0 refills | Status: DC | PRN

## 2019-05-16 NOTE — Discharge Instructions (Signed)
Start hydroxyzine with new increased dosage for current acute event. As discussed, I have also called in clonazepam if needed. Please follow up with counselor/PCP for further management needed. Go to the emergency department if having any suicidal or homicidal ideation.

## 2019-05-16 NOTE — ED Triage Notes (Signed)
Pt presents to [facility] for assessment after her son feel out of the second story window on Friday and landing on the concrete front deck.  States son had to be admitted and she was not present for the incident, but since then she is having difficulty sleeping, managing her worries, and having, what she thinks, are panic attacks (no hx of same).  Pt does see a counselor, who she reached out to, and she was instructed to come here for evaluation.

## 2019-05-16 NOTE — ED Provider Notes (Signed)
[name]    CSN: [id number] Arrival date & time: 05/16/19  1449     History   Chief Complaint Chief Complaint  Patient presents with  . Anxiety    HPI Ashley Casey is a 38 y.o. female.   38 year old female with history of gestational DM, anxiety, bells palsy comes in for anxiety after acute event. States received a call 4 days ago that her 77 year old son fell out of the second story window, hitting on the concrete front deck with injury along the left side of face. [facility] and was [facility]. Since receiving new, she has had increase in anxiety, reliving incident, and playing "what ifs" repeatedly. She wakes up multiple times at night to check on her son. She denies chest pain, shortness of breath, hyperventilation. Denies suicidal or homicidal ideation. States she talked with her counselor, and was told to come in for evaluation.  She has a history of anxiety, was once on clonazepam. States her team of providers helped her realize her relationship at the time was causing her anxiety. She was able to come off clonazepam and has not had anxiety or medications for anxiety for the past few years. She currently takes hydroxyzine 25mg  QHS PRN for sinus symptoms during spring. She alternates it with benadryl.         Past Medical History:  Diagnosis Date  . Abnormal Pap smear   . Anxiety    in past  . Bell's palsy    after 5th pregnancy never resolved  . Cholelithiasis   . Fatigue   . Gestational diabetes    metformin  . Gestational diabetes mellitus, antepartum   . History of shingles   . IBS (irritable bowel syndrome)   . Neuromuscular disorder [facility])     Patient Active Problem List   Diagnosis Date Noted  . Indication for care in labor or delivery 07/19/2018  . Impaired glucose tolerance test 03/04/2018  . Term pregnancy 03/06/2017  . Postpartum care following vaginal delivery 03/06/2017  . Normal pregnancy in  third trimester 11/18/2014  . Other normal pregnancy, not first 11/18/2014  . SVD (spontaneous vaginal delivery) 11/18/2014  . Decreased fetal movement   . [redacted] weeks gestation of pregnancy   . Dependency on pain medication ([place]) 10/17/2013  . Symptomatic cholelithiasis 07/28/2011    Past Surgical History:  Procedure Laterality Date  . 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 CHOLECYSTECTOMY  8/29/121  . THERAPEUTIC ABORTION    . WISDOM TOOTH EXTRACTION      OB History    Gravida  10   Para  7   Term  7   Preterm      AB  3   Living  7     SAB  2   TAB  1   Ectopic      Multiple  0   Live Births  7            Home Medications    Prior to Admission medications   Medication Sig Start Date End Date Taking? Authorizing Provider  diphenhydrAMINE (BENADRYL) 25 mg capsule Take 25 mg by mouth every 6 (six) hours as needed.   Yes [provider]  Prenat-FeCbn-FeAsp-Meth-FA-DHA (PRENATE MINI) 18-0.6-0.4-350 MG CAPS Take 1 capsule by mouth daily.   Yes [provider]  acetaminophen (TYLENOL) 500 MG tablet Take 1,000 mg by mouth every 6 (six) hours as needed  for moderate pain.     [provider]  clonazePAM (KLONOPIN) 0.5 MG tablet Take 1 tablet (0.5 mg total) by mouth 2 (two) times daily as needed for anxiety. 05/16/19   Tasia Catchings, Amy V, PA-C  EPINEPHrine (EPIPEN 2-PAK) 0.3 mg/0.3 mL IJ SOAJ injection Inject 0.3 mLs (0.3 mg total) into the muscle once as needed (for severe allergic reaction). CAll 911 immediately if you have to use this medicine 11/28/16   Pisciotta, Elmyra Ricks, PA-C  hydrocortisone cream 1 % Apply 1 application topically 2 (two) times daily as needed for itching.    [provider]  hydrOXYzine (ATARAX/VISTARIL) 50 MG tablet Take 2 tablets (100 mg total) by mouth 4 (four) times daily for 7 days. 05/16/19 05/23/19  Tasia Catchings, Amy V, PA-C  ibuprofen (ADVIL,MOTRIN) 800 MG tablet Take 1 tablet (800 mg total) by mouth 3 (three) times daily.  02/15/19   Ok Edwards, PA-C    Family History Family History  Problem Relation Age of Onset  . Other Mother        sickle cell anemia  . Diabetes Mother   . Arthritis Brother   . Asthma Brother   . Diabetes Paternal Grandmother     Social History Social History   Tobacco Use  . Smoking status: Never Smoker  . Smokeless tobacco: Never Used  Substance Use Topics  . Alcohol use: No  . Drug use: No     Allergies   Food and Tegretol [carbamazepine]   Review of Systems Review of Systems  Reason unable to perform ROS: See HPI as above.     Physical Exam Triage Vital Signs ED Triage Vitals  Enc Vitals Group     BP 05/16/19 1509 132/87     Pulse Rate 05/16/19 1509 78     Resp 05/16/19 1509 16     Temp 05/16/19 1509 98.4 F (36.9 C)     Temp Source 05/16/19 1509 Oral     SpO2 05/16/19 1509 97 %     Weight --      Height --      Head Circumference --      Peak Flow --      Pain Score 05/16/19 1511 0     Pain Loc --      Pain Edu? --      Excl. in [place]? --    No data found.  Updated Vital Signs BP 132/87 (BP Location: Left Arm)   Pulse 78   Temp 98.4 F (36.9 C) (Oral)   Resp 16   SpO2 97%   Physical Exam Constitutional:      General: She is not in acute distress.    Appearance: She is well-developed. She is not diaphoretic.  HENT:     Head: Normocephalic and atraumatic.  Eyes:     Conjunctiva/sclera: Conjunctivae normal.     Pupils: Pupils are equal, round, and reactive to light.  Pulmonary:     Effort: Pulmonary effort is normal. No respiratory distress.  Neurological:     Mental Status: She is alert and oriented to person, place, and time.  Psychiatric:        Attention and Perception: Attention normal.        Mood and Affect: Mood is anxious. Mood is not depressed. Affect is not blunt, tearful or inappropriate.        Speech: Speech normal.        Behavior: Behavior normal. Behavior is not agitated, aggressive or withdrawn. Behavior is  cooperative.        Thought Content: Thought content normal.        Cognition and Memory: Cognition and memory normal.        Judgment: Judgment normal.     Comments: Appears anxious, will take deep breaths intermittently. Good eye contact.     UC Treatments / Results  Labs (all labs ordered are listed, but only abnormal results are displayed) Labs Reviewed - No data to display  EKG None  Radiology No results found.  Procedures Procedures (including critical care time)  Medications Ordered in UC Medications - No data to display  Initial Impression / Assessment and Plan / UC Course  I have reviewed the triage vital signs and the nursing notes.  Pertinent labs & imaging results that were available during my care of the patient were reviewed by me and considered in my medical decision making (see chart for details).    Discussed case with Dr Meda Coffee. Patient with anxiety due to acute event. She would like to avoid clonazepam if able to. Will increase hydroxyzine to 100mg  Q6H PRN and will provide clonazepam 0.5mg  BID PRN D#6 if needed. Patient with counselor and PCP, to follow up for recheck and further management needed. Return precautions given. Patient expresses understanding and agrees to plan.  Final Clinical Impressions(s) / UC Diagnoses   Final diagnoses:  Anxiety as acute reaction to exceptional stress   ED Prescriptions    Medication Sig Dispense Auth. Provider   hydrOXYzine (ATARAX/VISTARIL) 50 MG tablet Take 2 tablets (100 mg total) by mouth 4 (four) times daily for 7 days. 56 tablet Yu, Amy V, PA-C   clonazePAM (KLONOPIN) 0.5 MG tablet Take 1 tablet (0.5 mg total) by mouth 2 (two) times daily as needed for anxiety. 6 tablet Ok Edwards, PA-C     Controlled Substance Prescriptions [place] Controlled Substance Registry consulted? Yes, I have consulted the [place] Controlled Substances Registry for this patient, and feel the risk/benefit ratio today is favorable for proceeding  with this prescription for a controlled substance.   Ok Edwards, PA-C 05/16/19 1554

## 2019-05-16 NOTE — ED Notes (Signed)
Patient able to ambulate independently  

## 2020-01-16 ENCOUNTER — Ambulatory Visit
Admission: EM | Admit: 2020-01-16 | Discharge: 2020-01-16 | Disposition: A | Discharge status: Home / Self Care | Payer: Medicaid Other | Attending: Emergency Medicine | Admitting: Emergency Medicine

## 2020-01-16 ENCOUNTER — Encounter: Payer: Self-pay | Admitting: Emergency Medicine

## 2020-01-16 ENCOUNTER — Other Ambulatory Visit: Payer: Self-pay

## 2020-01-16 DIAGNOSIS — B9689 Other specified bacterial agents as the cause of diseases classified elsewhere: Secondary | ICD-10-CM | POA: Diagnosis not present

## 2020-01-16 DIAGNOSIS — Z3202 Encounter for pregnancy test, result negative: Secondary | ICD-10-CM

## 2020-01-16 DIAGNOSIS — N390 Urinary tract infection, site not specified: Secondary | ICD-10-CM | POA: Diagnosis not present

## 2020-01-16 LAB — POCT URINALYSIS DIP (MANUAL ENTRY)
Bilirubin, UA: NEGATIVE
Blood, UA: NEGATIVE
Glucose, UA: 250 mg/dL — AB
Nitrite, UA: POSITIVE — AB
Protein Ur, POC: 100 mg/dL — AB
Spec Grav, UA: 1.025 (ref 1.010–1.025)
Urobilinogen, UA: 2 E.U./dL — AB
pH, UA: 5.5 (ref 5.0–8.0)

## 2020-01-16 LAB — POCT URINE PREGNANCY: Preg Test, Ur: NEGATIVE

## 2020-01-16 MED ORDER — CIPROFLOXACIN HCL 500 MG PO TABS: 500.0000 mg | ORAL_TABLET | Freq: Two times a day (BID) | ORAL | 0 refills | Status: AC

## 2020-01-16 MED ORDER — FLUCONAZOLE 200 MG PO TABS: 200.0000 mg | ORAL_TABLET | Freq: Once | ORAL | 0 refills | Status: AC

## 2020-01-16 NOTE — ED Triage Notes (Addendum)
Pt presents to [facility] for assessment of full body myalgias every time she tries to urinate.  Patient took Rodey last night, has not taken another since.  Denies changes in color of urine, c/o lower back pain.  Patient also states Saturday morning she was constipated and did an enema with a large amount of relief at that time, but states she still feels like she has not emptied out.

## 2020-01-16 NOTE — Discharge Instructions (Addendum)
Take antibiotic as directed. Take diflucan on last day of antibiotic to prevent yeast infection.

## 2020-01-16 NOTE — ED Notes (Signed)
Patient able to ambulate independently  

## 2020-01-16 NOTE — ED Provider Notes (Signed)
[name]    CSN: [id number] Arrival date & time: 01/16/20  [id number]      History   Chief Complaint Chief Complaint  Patient presents with  . Dysuria    HPI Ashley Casey is a 39 y.o. female with history of neuromuscular disorder presenting for possible UTI.  Patient endorses history thereof, states this feels similar to previous episodes.  States that her body will hurt when she urinates; has been ongoing for the last 12 hours.  No myalgias outside of urination.  Patient has been taking Azo since last night with moderate relief of symptoms.  Patient denies blood in urine, abdominal pain, pelvic pain, vaginal pain or discharge.  No fever, chills.  Patient denies history of pyelonephritis, renal calculi.  Patient does endorse midthoracic back pain (L>R), though is unsure if this has to do with history of epidurals.   Past Medical History:  Diagnosis Date  . Abnormal Pap smear   . Anxiety    in past  . Bell's palsy    after 5th pregnancy never resolved  . Cholelithiasis   . Fatigue   . Gestational diabetes    metformin  . Gestational diabetes mellitus, antepartum   . History of shingles   . IBS (irritable bowel syndrome)   . Neuromuscular disorder [facility])     Patient Active Problem List   Diagnosis Date Noted  . Indication for care in labor or delivery 07/19/2018  . Impaired glucose tolerance test 03/04/2018  . Term pregnancy 03/06/2017  . Postpartum care following vaginal delivery 03/06/2017  . Normal pregnancy in third trimester 11/18/2014  . Other normal pregnancy, not first 11/18/2014  . SVD (spontaneous vaginal delivery) 11/18/2014  . Decreased fetal movement   . [redacted] weeks gestation of pregnancy   . Dependency on pain medication ([place]) 10/17/2013  . Symptomatic cholelithiasis 07/28/2011    Past Surgical History:  Procedure Laterality Date  . 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 CHOLECYSTECTOMY  8/29/121  . THERAPEUTIC ABORTION    . WISDOM  TOOTH EXTRACTION      OB History    Gravida  10   Para  7   Term  7   Preterm      AB  3   Living  7     SAB  2   TAB  1   Ectopic      Multiple  0   Live Births  7            Home Medications    Prior to Admission medications   Medication Sig Start Date End Date Taking? Authorizing Provider  hydrOXYzine (ATARAX/VISTARIL) 25 MG tablet Take 25 mg by mouth 3 (three) times daily as needed.   Yes [provider]  acetaminophen (TYLENOL) 500 MG tablet Take 1,000 mg by mouth every 6 (six) hours as needed for moderate pain.     [provider]  ciprofloxacin (CIPRO) 500 MG tablet Take 1 tablet (500 mg total) by mouth 2 (two) times daily for 5 days. 01/16/20 01/21/20  Hall-Potvin, [place], PA-C  clonazePAM (KLONOPIN) 0.5 MG tablet Take 1 tablet (0.5 mg total) by mouth 2 (two) times daily as needed for anxiety. 05/16/19   Tasia Catchings, Amy V, PA-C  diphenhydrAMINE (BENADRYL) 25 mg capsule Take 25 mg by mouth every 6 (six) hours as needed.    [provider]  EPINEPHrine (EPIPEN 2-PAK) 0.3 mg/0.3 mL IJ SOAJ injection Inject 0.3 mLs (0.3 mg total) into  the muscle once as needed (for severe allergic reaction). CAll 911 immediately if you have to use this medicine 11/28/16   Pisciotta, Elmyra Ricks, PA-C  fluconazole (DIFLUCAN) 200 MG tablet Take 1 tablet (200 mg total) by mouth once for 1 dose. May repeat in 72 hours if needed 01/16/20 01/16/20  Hall-Potvin, [place], PA-C  hydrocortisone cream 1 % Apply 1 application topically 2 (two) times daily as needed for itching.    [provider]  ibuprofen (ADVIL,MOTRIN) 800 MG tablet Take 1 tablet (800 mg total) by mouth 3 (three) times daily. 02/15/19   Tasia Catchings, Amy V, PA-C  Prenat-FeCbn-FeAsp-Meth-FA-DHA (PRENATE MINI) 18-0.6-0.4-350 MG CAPS Take 1 capsule by mouth daily.    [provider]    Family History Family History  Problem Relation Age of Onset  . Other Mother        sickle cell anemia  . Diabetes  Mother   . Arthritis Brother   . Asthma Brother   . Diabetes Paternal Grandmother     Social History Social History   Tobacco Use  . Smoking status: Never Smoker  . Smokeless tobacco: Never Used  Substance Use Topics  . Alcohol use: No  . Drug use: No     Allergies   Food and Tegretol [carbamazepine]   Review of Systems As per HPI   Physical Exam Triage Vital Signs ED Triage Vitals  Enc Vitals Group     BP      Pulse      Resp      Temp      Temp src      SpO2      Weight      Height      Head Circumference      Peak Flow      Pain Score      Pain Loc      Pain Edu?      Excl. in [place]?    No data found.  Updated Vital Signs BP 116/82 (BP Location: Right Arm)   Pulse 79   Temp (!) 97.3 F (36.3 C) (Temporal)   Resp 16   LMP 01/08/2020   SpO2 93%   Visual Acuity Right Eye Distance:   Left Eye Distance:   Bilateral Distance:    Right Eye Near:   Left Eye Near:    Bilateral Near:     Physical Exam Constitutional:      General: She is not in acute distress.    Appearance: She is not ill-appearing.  HENT:     Head: Normocephalic and atraumatic.  Eyes:     General: No scleral icterus.    Pupils: Pupils are equal, round, and reactive to light.  Cardiovascular:     Rate and Rhythm: Normal rate.  Pulmonary:     Effort: Pulmonary effort is normal. No respiratory distress.  Abdominal:     General: Bowel sounds are normal.     Palpations: Abdomen is soft.     Tenderness: There is no abdominal tenderness. There is left CVA tenderness. There is no right CVA tenderness or guarding.  Skin:    Coloration: Skin is not jaundiced or pale.  Neurological:     Mental Status: She is alert and oriented to person, place, and time.      UC Treatments / Results  Labs (all labs ordered are listed, but only abnormal results are displayed) Labs Reviewed  POCT URINALYSIS DIP (MANUAL ENTRY) - Abnormal; Notable for the following components:  Result Value     Color, UA orange (*)    Glucose, UA =250 (*)    Ketones, POC UA small (15) (*)    Protein Ur, POC =100 (*)    Urobilinogen, UA 2.0 (*)    Nitrite, UA Positive (*)    Leukocytes, UA Large (3+) (*)    All other components within normal limits  POCT URINE PREGNANCY - Normal  URINE CULTURE    EKG   Radiology No results found.  Procedures Procedures (including critical care time)  Medications Ordered in UC Medications - No data to display  Initial Impression / Assessment and Plan / UC Course  I have reviewed the triage vital signs and the nursing notes.  Pertinent labs & imaging results that were available during my care of the patient were reviewed by me and considered in my medical decision making (see chart for details).     Patient afebrile, nontoxic in office today.  Urine pregnancy negative, urine dipstick done in office and reviewed by me: Grossly abnormal with nitrates, leukocytes, protein, urobilinogen, ketones, glucose with orange appearance-culture pending.  Reviewed that urine dipstick likely affected by Azo.  Low concern for pyelonephritis given lack of fever, persistent myalgias, and patient's overall appearance in office today.  Patient does have left CVA tenderness though previous medical intervention due to chronic conditions could be contributory.  Will treat for complicated UTI as outlined below: Reviewed rare but serious side effects of Cipro as outlined in black box warning-patient verbalized understanding.  Patient endorsing yeast infections after antibiotic use: Diflucan sent.  Return precautions discussed, patient verbalized understanding and is agreeable to plan. Final Clinical Impressions(s) / UC Diagnoses   Final diagnoses:  Complicated UTI (urinary tract infection)     Discharge Instructions     Take antibiotic as directed. Take diflucan on last day of antibiotic to prevent yeast infection.    ED Prescriptions    Medication Sig Dispense Auth.  Provider   ciprofloxacin (CIPRO) 500 MG tablet Take 1 tablet (500 mg total) by mouth 2 (two) times daily for 5 days. 10 tablet Hall-Potvin, [place], PA-C   fluconazole (DIFLUCAN) 200 MG tablet Take 1 tablet (200 mg total) by mouth once for 1 dose. May repeat in 72 hours if needed 2 tablet Hall-Potvin, [place], PA-C     PDMP not reviewed this encounter.   Neldon [place], [place] 01/16/20 [phone]

## 2020-01-17 LAB — URINE CULTURE

## 2020-02-15 ENCOUNTER — Other Ambulatory Visit: Payer: Self-pay

## 2020-02-15 ENCOUNTER — Encounter: Payer: Self-pay | Admitting: Emergency Medicine

## 2020-02-15 ENCOUNTER — Ambulatory Visit
Admission: EM | Admit: 2020-02-15 | Discharge: 2020-02-15 | Disposition: A | Discharge status: Home / Self Care | Payer: Medicaid Other | Attending: Physician Assistant | Admitting: Physician Assistant

## 2020-02-15 DIAGNOSIS — R52 Pain, unspecified: Secondary | ICD-10-CM

## 2020-02-15 DIAGNOSIS — R519 Headache, unspecified: Secondary | ICD-10-CM

## 2020-02-15 DIAGNOSIS — R42 Dizziness and giddiness: Secondary | ICD-10-CM | POA: Diagnosis not present

## 2020-02-15 DIAGNOSIS — R6883 Chills (without fever): Secondary | ICD-10-CM

## 2020-02-15 DIAGNOSIS — Z20822 Contact with and (suspected) exposure to covid-19: Secondary | ICD-10-CM

## 2020-02-15 DIAGNOSIS — Z3202 Encounter for pregnancy test, result negative: Secondary | ICD-10-CM

## 2020-02-15 LAB — POCT URINALYSIS DIP (MANUAL ENTRY)
Bilirubin, UA: NEGATIVE
Blood, UA: NEGATIVE
Glucose, UA: NEGATIVE mg/dL
Ketones, POC UA: NEGATIVE mg/dL
Leukocytes, UA: NEGATIVE
Nitrite, UA: NEGATIVE
Protein Ur, POC: NEGATIVE mg/dL
Spec Grav, UA: <=1.005 — AB (ref 1.010–1.025)
Urobilinogen, UA: 0.2 E.U./dL
pH, UA: 6 (ref 5.0–8.0)

## 2020-02-15 LAB — POCT URINE PREGNANCY: Preg Test, Ur: NEGATIVE

## 2020-02-15 MED ORDER — KETOROLAC TROMETHAMINE 30 MG/ML IJ SOLN
15.0000 mg | Freq: Once | INTRAMUSCULAR | Status: AC
  Administered 2020-02-15: 15 mg via INTRAVENOUS

## 2020-02-15 MED ORDER — DEXAMETHASONE SODIUM PHOSPHATE 10 MG/ML IJ SOLN
10.0000 mg | Freq: Once | INTRAMUSCULAR | Status: AC
  Administered 2020-02-15: 10 mg via INTRAVENOUS

## 2020-02-15 MED ORDER — METOCLOPRAMIDE HCL 5 MG/ML IJ SOLN
5.0000 mg | Freq: Once | INTRAMUSCULAR | Status: AC
  Administered 2020-02-15: 5 mg via INTRAVENOUS

## 2020-02-15 MED ORDER — MECLIZINE HCL 25 MG PO TABS: 25.0000 mg | ORAL_TABLET | Freq: Three times a day (TID) | ORAL | 0 refills | Status: DC | PRN

## 2020-02-15 MED ORDER — SODIUM CHLORIDE 0.9 % IV BOLUS
1000.0000 mL | Freq: Once | INTRAVENOUS | Status: AC
  Administered 2020-02-15: 1000 mL via INTRAVENOUS

## 2020-02-15 MED ORDER — DIPHENHYDRAMINE HCL 50 MG/ML IJ SOLN
25.0000 mg | Freq: Once | INTRAMUSCULAR | Status: AC
  Administered 2020-02-15: 25 mg via INTRAVENOUS

## 2020-02-15 NOTE — ED Triage Notes (Signed)
Pt presents to [facility] for assessment of headache, generalized abdominal pain, nausea, dizziness, light-headedness, chills, weak.

## 2020-02-15 NOTE — ED Notes (Signed)
Patient able to ambulate independently  

## 2020-02-15 NOTE — ED Provider Notes (Signed)
[name]    CSN: [id number] Arrival date & time: 02/15/20  1855      History   Chief Complaint Chief Complaint  Patient presents with  . Headache    HPI Ashley Casey is a 39 y.o. female.   39 year old female comes in for acute onset of headache, generalized abdominal pain, nausea, dizziness, lightheadedness, chills, weak. Headache is frontal, pounding sensation, causing nausea, photophobia. Has had intermittent hot and cold chills.  Denies URI symptoms such cough, congestion, sore throat. No fever. Denies urinary symptoms such as frequency, dysuria, hematuria. Denies head injury, loss of consciousness. LMP 02/11/2020, states lighter than normal. No known sick contact. Ibuprofen 400mg  with some relief.      Past Medical History:  Diagnosis Date  . Abnormal Pap smear   . Anxiety    in past  . Bell's palsy    after 5th pregnancy never resolved  . Cholelithiasis   . Fatigue   . Gestational diabetes    metformin  . Gestational diabetes mellitus, antepartum   . History of shingles   . IBS (irritable bowel syndrome)   . Neuromuscular disorder [facility])     Patient Active Problem List   Diagnosis Date Noted  . Indication for care in labor or delivery 07/19/2018  . Impaired glucose tolerance test 03/04/2018  . Term pregnancy 03/06/2017  . Postpartum care following vaginal delivery 03/06/2017  . Normal pregnancy in third trimester 11/18/2014  . Other normal pregnancy, not first 11/18/2014  . SVD (spontaneous vaginal delivery) 11/18/2014  . Decreased fetal movement   . [redacted] weeks gestation of pregnancy   . Dependency on pain medication ([place]) 10/17/2013  . Symptomatic cholelithiasis 07/28/2011   Past Surgical History:  Procedure Laterality Date  . 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 CHOLECYSTECTOMY  8/29/121  . THERAPEUTIC ABORTION    . WISDOM TOOTH EXTRACTION      OB History    Gravida  10   Para  7   Term  7   Preterm      AB  3   Living  7     SAB  2   TAB  1   Ectopic      Multiple  0   Live Births  7            Home Medications    Prior to Admission medications   Medication Sig Start Date End Date Taking? Authorizing Provider  acetaminophen (TYLENOL) 500 MG tablet Take 1,000 mg by mouth every 6 (six) hours as needed for moderate pain.     [provider]  diphenhydrAMINE (BENADRYL) 25 mg capsule Take 25 mg by mouth every 6 (six) hours as needed.    [provider]  EPINEPHrine (EPIPEN 2-PAK) 0.3 mg/0.3 mL IJ SOAJ injection Inject 0.3 mLs (0.3 mg total) into the muscle once as needed (for severe allergic reaction). CAll 911 immediately if you have to use this medicine 11/28/16   Pisciotta, Elmyra Ricks, PA-C  hydrocortisone cream 1 % Apply 1 application topically 2 (two) times daily as needed for itching.    [provider]  hydrOXYzine (ATARAX/VISTARIL) 25 MG tablet Take 25 mg by mouth 3 (three) times daily as needed.    [provider]  ibuprofen (ADVIL,MOTRIN) 800 MG tablet Take 1 tablet (800 mg total) by mouth 3 (three) times daily. 02/15/19   Ok Edwards, PA-C  meclizine (ANTIVERT) 25 MG tablet Take 1 tablet (25 mg  total) by mouth 3 (three) times daily as needed for dizziness. 02/15/20   Tasia Catchings, Geovanie Winnett V, PA-C  Prenat-FeCbn-FeAsp-Meth-FA-DHA (PRENATE MINI) 18-0.6-0.4-350 MG CAPS Take 1 capsule by mouth daily.    [provider]  clonazePAM (KLONOPIN) 0.5 MG tablet Take 1 tablet (0.5 mg total) by mouth 2 (two) times daily as needed for anxiety. 05/16/19 02/15/20  Ok Edwards, PA-C    Family History Family History  Problem Relation Age of Onset  . Other Mother        sickle cell anemia  . Diabetes Mother   . Arthritis Brother   . Asthma Brother   . Diabetes Paternal Grandmother     Social History Social History   Tobacco Use  . Smoking status: Never Smoker  . Smokeless tobacco: Never Used  Substance Use Topics  . Alcohol use: No  . Drug use: No     Allergies    Food and Tegretol [carbamazepine]   Review of Systems Review of Systems  Reason unable to perform ROS: See HPI as above.     Physical Exam Triage Vital Signs ED Triage Vitals [02/15/20 1919]  Enc Vitals Group     BP (!) 141/82     Pulse Rate 79     Resp 18     Temp 98.5 F (36.9 C)     Temp Source Oral     SpO2 98 %     Weight      Height      Head Circumference      Peak Flow      Pain Score 8     Pain Loc      Pain Edu?      Excl. in [place]?    No data found.  Updated Vital Signs BP (!) 141/82 (BP Location: Left Arm)   Pulse 79   Temp 98.5 F (36.9 C) (Oral)   Resp 18   LMP 02/11/2020   SpO2 98%   Physical Exam Constitutional:      General: She is not in acute distress.    Appearance: Normal appearance. She is not ill-appearing, toxic-appearing or diaphoretic.  HENT:     Head: Normocephalic and atraumatic.     Right Ear: Tympanic membrane, ear canal and external ear normal. Tympanic membrane is not erythematous or bulging.     Left Ear: Tympanic membrane, ear canal and external ear normal. Tympanic membrane is not erythematous or bulging.     Nose:     Right Sinus: Frontal sinus tenderness present. No maxillary sinus tenderness.     Left Sinus: Frontal sinus tenderness present. No maxillary sinus tenderness.     Mouth/Throat:     Mouth: Mucous membranes are moist.     Pharynx: Oropharynx is clear. Uvula midline.  Eyes:     Extraocular Movements: Extraocular movements intact.     Conjunctiva/sclera: Conjunctivae normal.     Pupils: Pupils are equal, round, and reactive to light.  Cardiovascular:     Rate and Rhythm: Normal rate and regular rhythm.     Heart sounds: Normal heart sounds. No murmur. No friction rub. No gallop.   Pulmonary:     Effort: Pulmonary effort is normal. No accessory muscle usage, prolonged expiration, respiratory distress or retractions.     Comments: Lungs clear to auscultation without adventitious lung sounds. Abdominal:      General: Bowel sounds are normal.     Palpations: Abdomen is soft.     Tenderness: There is no abdominal  tenderness. There is no guarding or rebound.  Musculoskeletal:     Cervical back: Normal range of motion and neck supple.  Neurological:     General: No focal deficit present.     Mental Status: She is alert and oriented to person, place, and time.     GCS: GCS eye subscore is 4. GCS verbal subscore is 5. GCS motor subscore is 6.     Comments: Cranial nerves II-XII grossly intact. Strength 5/5 bilaterally for upper and lower extremity. Sensation intact. Normal coordination with normal finger to nose, heel to shin. Negative pronator drift, romberg. Gait intact. Able to ambulate on own, though slower due to dizziness.      UC Treatments / Results  Labs (all labs ordered are listed, but only abnormal results are displayed) Labs Reviewed  POCT URINALYSIS DIP (MANUAL ENTRY) - Abnormal; Notable for the following components:      Result Value   Spec Grav, UA <=1.005 (*)    All other components within normal limits  NOVEL CORONAVIRUS, NAA  POCT URINE PREGNANCY    EKG   Radiology No results found.  Procedures Procedures (including critical care time)  Medications Ordered in UC Medications  metoCLOPramide (REGLAN) injection 5 mg (5 mg Intravenous Given 02/15/20 2011)  dexamethasone (DECADRON) injection 10 mg (10 mg Intravenous Given 02/15/20 2010)  ketorolac (TORADOL) 30 MG/ML injection 15 mg (15 mg Intravenous Given 02/15/20 2010)  diphenhydrAMINE (BENADRYL) injection 25 mg (25 mg Intravenous Given 02/15/20 2010)  sodium chloride 0.9 % bolus 1,000 mL (1,000 mLs Intravenous New Bag/Given 02/15/20 2020)    Initial Impression / Assessment and Plan / UC Course  I have reviewed the triage vital signs and the nursing notes.  Pertinent labs & imaging results that were available during my care of the patient were reviewed by me and considered in my medical decision making (see chart for  details).    Urine pregnancy negative, dipstick negative for infection. Neurology exam grossly intact. Dizziness associated with headache and neck motion. Will treat for headache with toradol, reglan, decadron, benadryl. Discussed cannot rule out COVID despite without URI symptoms, patient agreeable for testing. Testing sent, patient to quarantine until testing results return. Return precautions given. Patient expresses understanding and agrees to plan.  Final Clinical Impressions(s) / UC Diagnoses   Final diagnoses:  Chills  Body aches  Frontal headache  Dizziness   ED Prescriptions    Medication Sig Dispense Auth. Provider   meclizine (ANTIVERT) 25 MG tablet Take 1 tablet (25 mg total) by mouth 3 (three) times daily as needed for dizziness. 15 tablet Ok Edwards, PA-C     PDMP not reviewed this encounter.   Ok Edwards, PA-C 02/15/20 2026

## 2020-02-15 NOTE — Discharge Instructions (Signed)
COVID PCR testing ordered. I would like you to quarantine until testing results. Toradol, reglan, decadron, benadryl given in office today for headache. Meclizine for dizziness as needed. Continue to monitor symptoms. Keep hydrated, urine should be clear to pale yellow in color. If experiencing shortness of breath, trouble breathing, go to the emergency department for further evaluation needed. If passing out, confusion, one sided weakness, go to the emergency department for further evaluation.

## 2020-02-18 LAB — NOVEL CORONAVIRUS, NAA: SARS-CoV-2, NAA: NOT DETECTED

## 2020-05-03 ENCOUNTER — Ambulatory Visit
Admission: EM | Admit: 2020-05-03 | Discharge: 2020-05-03 | Disposition: A | Discharge status: Home / Self Care | Payer: Medicaid Other | Attending: Family Medicine | Admitting: Family Medicine

## 2020-05-03 ENCOUNTER — Encounter: Payer: Self-pay | Admitting: Emergency Medicine

## 2020-05-03 ENCOUNTER — Other Ambulatory Visit: Payer: Self-pay

## 2020-05-03 DIAGNOSIS — J3489 Other specified disorders of nose and nasal sinuses: Secondary | ICD-10-CM

## 2020-05-03 DIAGNOSIS — R05 Cough: Secondary | ICD-10-CM

## 2020-05-03 DIAGNOSIS — R5383 Other fatigue: Secondary | ICD-10-CM

## 2020-05-03 DIAGNOSIS — R0602 Shortness of breath: Secondary | ICD-10-CM

## 2020-05-03 DIAGNOSIS — R059 Cough, unspecified: Secondary | ICD-10-CM

## 2020-05-03 DIAGNOSIS — R0789 Other chest pain: Secondary | ICD-10-CM

## 2020-05-03 MED ORDER — ALBUTEROL SULFATE HFA 108 (90 BASE) MCG/ACT IN AERS: 2.0000 | INHALATION_SPRAY | RESPIRATORY_TRACT | 0 refills | Status: DC | PRN

## 2020-05-03 MED ORDER — PREDNISONE 10 MG (21) PO TBPK: ORAL_TABLET | Freq: Every day | ORAL | 0 refills | Status: AC

## 2020-05-03 NOTE — ED Triage Notes (Signed)
Patient started feeling bad on Sunday, 04/29/2020 Patient had a funeral on Friday in [place], 04/27/2020 Patient traveled by private vehicle.   Complains of cough .  Chest started hurting with coughing.  Non-productive cough.  Patient has sinus drainage and congestion.  Patient reports diarrhea, none today.  Patient feels weak

## 2020-05-03 NOTE — ED Provider Notes (Signed)
[place]   [id number] 05/03/20 Arrival Time: [id number]   CC: COVID symptoms  SUBJECTIVE: History from: patient.  Ashley Casey is a 39 y.o. female who presents with abrupt onset of nasal congestion, PND, and persistent dry cough, chest pain with coughing, SOB with coughing for 5 days.  Denies sick exposure to COVID, flu or strep. Has recently traveled to [place] for a funeral. Has tried no treatments at home. Symptoms are made worse with activity. Denies previous symptoms in the past. Denies fever, chills, rhinorrhea, sore throat, SOB, wheezing, nausea, changes in bowel or bladder habits.    ROS: As per HPI.  All other pertinent ROS negative.     Past Medical History:  Diagnosis Date  . Abnormal Pap smear   . Anxiety    in past  . Bell's palsy    after 5th pregnancy never resolved  . Cholelithiasis   . Fatigue   . Gestational diabetes    metformin  . Gestational diabetes mellitus, antepartum   . History of shingles   . IBS (irritable bowel syndrome)   . Neuromuscular disorder [facility])    Past Surgical History:  Procedure Laterality Date  . 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 CHOLECYSTECTOMY  8/29/121  . THERAPEUTIC ABORTION    . WISDOM TOOTH EXTRACTION     Allergies  Allergen Reactions  . 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    . Tegretol [Carbamazepine] Other (See Comments)    Reaction:  Seizures    No current facility-administered medications on file prior to encounter.   Current Outpatient Medications on File Prior to Encounter  Medication Sig Dispense Refill  . acetaminophen (TYLENOL) 500 MG tablet Take 1,000 mg by mouth every 6 (six) hours as needed for moderate pain.     . diphenhydrAMINE (BENADRYL) 25 mg capsule Take 25 mg by mouth every 6 (six) hours as needed.    Marland Kitchen EPINEPHrine (EPIPEN 2-PAK) 0.3 mg/0.3 mL IJ SOAJ injection Inject 0.3 mLs (0.3 mg total) into the muscle once as  needed (for severe allergic reaction). CAll 911 immediately if you have to use this medicine 1 Device 1  . hydrocortisone cream 1 % Apply 1 application topically 2 (two) times daily as needed for itching.    . hydrOXYzine (ATARAX/VISTARIL) 25 MG tablet Take 25 mg by mouth 3 (three) times daily as needed.    Marland Kitchen ibuprofen (ADVIL,MOTRIN) 800 MG tablet Take 1 tablet (800 mg total) by mouth 3 (three) times daily. 30 tablet 0  . meclizine (ANTIVERT) 25 MG tablet Take 1 tablet (25 mg total) by mouth 3 (three) times daily as needed for dizziness. 15 tablet 0  . Prenat-FeCbn-FeAsp-Meth-FA-DHA (PRENATE MINI) 18-0.6-0.4-350 MG CAPS Take 1 capsule by mouth daily.    . [DISCONTINUED] clonazePAM (KLONOPIN) 0.5 MG tablet Take 1 tablet (0.5 mg total) by mouth 2 (two) times daily as needed for anxiety. 6 tablet 0   Social History   Socioeconomic History  . Marital status: Married    Spouse name: Not on file  . Number of children: Not on file  . Years of education: Not on file  . Highest education level: Not on file  Occupational History  . Not on file  Tobacco Use  . Smoking status: Never Smoker  . Smokeless tobacco: Never Used  Substance and Sexual Activity  . Alcohol use: No  . Drug use: No  . Sexual  activity: Yes    Partners: Male    Birth control/protection: None  Other Topics Concern  . Not on file  Social History Narrative  . Not on file   Social Determinants of Health   Financial Resource Strain:   . Difficulty of Paying Living Expenses:   Food Insecurity:   . Worried About Charity fundraiser in the Last Year:   . Arboriculturist in the Last Year:   Transportation Needs:   . Film/video editor (Medical):   Marland Kitchen Lack of Transportation (Non-Medical):   Physical Activity:   . Days of Exercise per Week:   . Minutes of Exercise per Session:   Stress:   . Feeling of Stress :   Social Connections:   . Frequency of Communication with Friends and Family:   . Frequency of Social  Gatherings with Friends and Family:   . Attends Religious Services:   . Active Member of Clubs or Organizations:   . Attends Archivist Meetings:   Marland Kitchen Marital Status:   Intimate Partner Violence:   . Fear of Current or Ex-Partner:   . Emotionally Abused:   Marland Kitchen Physically Abused:   . Sexually Abused:    Family History  Problem Relation Age of Onset  . Other Mother        sickle cell anemia  . Diabetes Mother   . Arthritis Brother   . Asthma Brother   . Diabetes Paternal Grandmother     OBJECTIVE:  Vitals:   05/03/20 0842  BP: 116/86  Pulse: (!) 101  Resp: 18  Temp: 98.1 F (36.7 C)  TempSrc: Oral  SpO2: 96%     General appearance: alert; appears fatigued, but nontoxic; speaking in full sentences and tolerating own secretions HEENT: NCAT; Ears: EACs clear, TMs pearly gray; Eyes: PERRL.  EOM grossly intact. Sinuses: nontender; Nose: nares patent without rhinorrhea, Throat: oropharynx clear, tonsils non erythematous or enlarged, uvula midline  Neck: supple without LAD Lungs: unlabored respirations, symmetrical air entry; cough: present; no respiratory distress; CTAB Heart: regular rate and rhythm.  Radial pulses 2+ symmetrical bilaterally Skin: warm and dry Psychological: alert and cooperative; normal mood and affect  LABS:  No results found for this or any previous visit (from the past 24 hour(s)).   ASSESSMENT & PLAN:  1. Cough   2. Sinus pain   3. Other fatigue   4. SOB (shortness of breath)   5. Chest tightness     Meds ordered this encounter  Medications  . predniSONE (STERAPRED UNI-PAK 21 TAB) 10 MG (21) TBPK tablet    Sig: Take by mouth daily for 6 days. Take 6 tablets on day 1, 5 tablets on day 2, 4 tablets on day 3, 3 tablets on day 4, 2 tablets on day 5, 1 tablet on day 6    Dispense:  21 tablet    Refill:  0    Order Specific Question:   Supervising Provider    Answer:   Chase Picket [id number]  . albuterol (VENTOLIN HFA) 108 (90 Base)  MCG/ACT inhaler    Sig: Inhale 2 puffs into the lungs every 4 (four) hours as needed for wheezing or shortness of breath.    Dispense:  18 g    Refill:  0    Order Specific Question:   Supervising Provider    Answer:   Chase Picket [id number]      Prescribed albuterol 2 puffs every 4 hours as needed  for cough and shortness of breath. Prescribed prednisone taper. Take as directed. Patient should remain in quarantine until they have received Covid results.  If negative you may resume normal activities (go back to work/school) while practicing hand hygiene, social distance, and mask wearing.  If positive, patient should remain in quarantine for 10 days from symptom onset AND greater than 72 hours after symptoms resolution (absence of fever without the use of fever-reducing medication and improvement in respiratory symptoms), whichever is longer Get plenty of rest and push fluids Use OTC zyrtec for nasal congestion, runny nose, and/or sore throat Use OTC flonase for nasal congestion and runny nose Use medications daily for symptom relief Use OTC medications like ibuprofen or tylenol as needed fever or pain Call or go to the ED if you have any new or worsening symptoms such as fever, worsening cough, shortness of breath, chest tightness, chest pain, turning blue, changes in mental status.    COVID testing ordered.  It will take between 5-7 days for test results.  Someone will contact you regarding abnormal results.    Reviewed expectations re: course of current medical issues. Questions answered. Outlined signs and symptoms indicating need for more acute intervention. Patient verbalized understanding. After Visit Summary given.         Faustino Congress, NP 05/03/20 [phone]

## 2020-05-03 NOTE — ED Notes (Signed)
Obtained nasal swab for send out covid, labeled and verified name and birth date

## 2020-05-03 NOTE — Discharge Instructions (Addendum)
I have sent in a steroid taper for you to take.   I have also sent in an albuterol inhaler for you to use, 2 puffs every 4 hours as needed for cough, wheezing, chest tightness.  Your COVID test is pending.  You should self quarantine until the test result is back.    Take Tylenol as needed for fever or discomfort.  Rest and keep yourself hydrated.    Go to the emergency department if you develop shortness of breath, severe diarrhea, high fever not relieved by Tylenol or ibuprofen, or other concerning symptoms.

## 2020-05-04 LAB — SARS-COV-2, NAA 2 DAY TAT

## 2020-05-04 LAB — NOVEL CORONAVIRUS, NAA: SARS-CoV-2, NAA: NOT DETECTED

## 2021-05-03 ENCOUNTER — Ambulatory Visit
Admission: EM | Admit: 2021-05-03 | Discharge: 2021-05-03 | Disposition: A | Discharge status: Home / Self Care | Payer: 59 | Attending: Emergency Medicine | Admitting: Emergency Medicine

## 2021-05-03 ENCOUNTER — Other Ambulatory Visit: Payer: Self-pay

## 2021-05-03 DIAGNOSIS — R1030 Lower abdominal pain, unspecified: Secondary | ICD-10-CM

## 2021-05-03 LAB — POCT URINALYSIS DIP (MANUAL ENTRY)
Bilirubin, UA: NEGATIVE
Blood, UA: NEGATIVE
Glucose, UA: NEGATIVE mg/dL
Ketones, POC UA: NEGATIVE mg/dL
Leukocytes, UA: NEGATIVE
Nitrite, UA: NEGATIVE
Protein Ur, POC: NEGATIVE mg/dL
Spec Grav, UA: 1.01 (ref 1.010–1.025)
Urobilinogen, UA: 0.2 E.U./dL
pH, UA: 7 (ref 5.0–8.0)

## 2021-05-03 LAB — POCT URINE PREGNANCY: Preg Test, Ur: NEGATIVE

## 2021-05-03 MED ORDER — ONDANSETRON 4 MG PO TBDP: 4.0000 mg | ORAL_TABLET | Freq: Three times a day (TID) | ORAL | 0 refills | Status: DC | PRN

## 2021-05-03 MED ORDER — NAPROXEN 500 MG PO TABS: 500.0000 mg | ORAL_TABLET | Freq: Two times a day (BID) | ORAL | 0 refills | Status: DC

## 2021-05-03 NOTE — Discharge Instructions (Addendum)
Urine normal Blood work pending Use Zofran dissolved in mouth as needed for nausea/vomiting Increase fluid intake Naprosyn twice daily with food for pain/cramping Follow-up with OB/GYN If pain progressing and worsening please go to emergency room

## 2021-05-03 NOTE — ED Provider Notes (Signed)
[name]    CSN: [id number] Arrival date & time: 05/03/21  0836      History   Chief Complaint Chief Complaint  Patient presents with  . Abdominal Pain  . Emesis    HPI Ashley Casey is a 40 y.o. female history of prior cholecystectomy, presenting today for evaluation of abdominal pain and nausea.  Reports symptoms have been going on for approximately 5 days.  Reports she has had lower abdominal cramping which has been relatively constant and slightly worsening over the past 5 days.  Denies vomiting.  Denies diarrhea or constipation.  Last bowel movement today.  Denies blood in stool.  Tolerating liquids and some solids, but overall decreased oral intake.  Denies URI symptoms.  Denies fevers chills or body aches.  Denies any urinary symptoms.  Denies concerns for STDs and declines any new partners.  Denies vaginal discharge.  She reports negative at home pregnancy test, but expresses concern regarding this.  Last menstrual cycle was around 04/11/2021.   HPI  Past Medical History:  Diagnosis Date  . Abnormal Pap smear   . Anxiety    in past  . Bell's palsy    after 5th pregnancy never resolved  . Cholelithiasis   . Fatigue   . Gestational diabetes    metformin  . Gestational diabetes mellitus, antepartum   . History of shingles   . IBS (irritable bowel syndrome)   . Neuromuscular disorder [facility])     Patient Active Problem List   Diagnosis Date Noted  . Indication for care in labor or delivery 07/19/2018  . Impaired glucose tolerance test 03/04/2018  . Term pregnancy 03/06/2017  . Postpartum care following vaginal delivery 03/06/2017  . Normal pregnancy in third trimester 11/18/2014  . Other normal pregnancy, not first 11/18/2014  . SVD (spontaneous vaginal delivery) 11/18/2014  . Decreased fetal movement   . [redacted] weeks gestation of pregnancy   . Dependency on pain medication ([place]) 10/17/2013  . Symptomatic cholelithiasis 07/28/2011    Past Surgical  History:  Procedure Laterality Date  . 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 CHOLECYSTECTOMY  8/29/121  . THERAPEUTIC ABORTION    . WISDOM TOOTH EXTRACTION      OB History    Gravida  10   Para  7   Term  7   Preterm      AB  3   Living  7     SAB  2   IAB  1   Ectopic      Multiple  0   Live Births  7            Home Medications    Prior to Admission medications   Medication Sig Start Date End Date Taking? Authorizing Provider  naproxen (NAPROSYN) 500 MG tablet Take 1 tablet (500 mg total) by mouth 2 (two) times daily. 05/03/21  Yes Jaycen Vercher C, PA-C  ondansetron (ZOFRAN ODT) 4 MG disintegrating tablet Take 1-2 tablets (4-8 mg total) by mouth every 8 (eight) hours as needed for nausea or vomiting. 05/03/21  Yes Braylyn Kalter C, PA-C  acetaminophen (TYLENOL) 500 MG tablet Take 1,000 mg by mouth every 6 (six) hours as needed for moderate pain.     [provider]  diphenhydrAMINE (BENADRYL) 25 mg capsule Take 25 mg by mouth every 6 (six) hours as needed.    [provider]  EPINEPHrine (EPIPEN 2-PAK) 0.3 mg/0.3 mL IJ SOAJ injection Inject 0.3 mLs (  0.3 mg total) into the muscle once as needed (for severe allergic reaction). CAll 911 immediately if you have to use this medicine 11/28/16   Pisciotta, Elmyra Ricks, PA-C  hydrOXYzine (ATARAX/VISTARIL) 25 MG tablet Take 25 mg by mouth 3 (three) times daily as needed.    [provider]  ibuprofen (ADVIL,MOTRIN) 800 MG tablet Take 1 tablet (800 mg total) by mouth 3 (three) times daily. 02/15/19   Tasia Catchings, Amy V, PA-C  albuterol (VENTOLIN HFA) 108 (90 Base) MCG/ACT inhaler Inhale 2 puffs into the lungs every 4 (four) hours as needed for wheezing or shortness of breath. 05/03/20 05/03/21  Faustino Congress, NP  clonazePAM (KLONOPIN) 0.5 MG tablet Take 1 tablet (0.5 mg total) by mouth 2 (two) times daily as needed for anxiety. 05/16/19 02/15/20  Ok Edwards, PA-C    Family History Family History  Problem  Relation Age of Onset  . Other Mother        sickle cell anemia  . Diabetes Mother   . Arthritis Brother   . Asthma Brother   . Diabetes Paternal Grandmother     Social History Social History   Tobacco Use  . Smoking status: Never Smoker  . Smokeless tobacco: Never Used  Substance Use Topics  . Alcohol use: No  . Drug use: No     Allergies   Food and Tegretol [carbamazepine]   Review of Systems Review of Systems  Constitutional: Negative for fatigue and fever.  HENT: Negative for mouth sores.   Eyes: Negative for visual disturbance.  Respiratory: Negative for shortness of breath.   Cardiovascular: Negative for chest pain.  Gastrointestinal: Positive for abdominal pain, nausea and vomiting. Negative for diarrhea.  Genitourinary: Negative for genital sores.  Musculoskeletal: Negative for arthralgias and joint swelling.  Skin: Negative for color change, rash and wound.  Neurological: Negative for dizziness, weakness, light-headedness and headaches.     Physical Exam Triage Vital Signs ED Triage Vitals [05/03/21 0900]  Enc Vitals Group     BP 127/80     Pulse Rate 76     Resp 16     Temp 98.3 F (36.8 C)     Temp Source Oral     SpO2 97 %     Weight      Height      Head Circumference      Peak Flow      Pain Score      Pain Loc      Pain Edu?      Excl. in [place]?    No data found.  Updated Vital Signs BP 127/80 (BP Location: Left Arm)   Pulse 76   Temp 98.3 F (36.8 C) (Oral)   Resp 16   LMP 04/11/2021   SpO2 97%   Visual Acuity Right Eye Distance:   Left Eye Distance:   Bilateral Distance:    Right Eye Near:   Left Eye Near:    Bilateral Near:     Physical Exam Vitals and nursing note reviewed.  Constitutional:      Appearance: She is well-developed.     Comments: No acute distress  HENT:     Head: Normocephalic and atraumatic.     Nose: Nose normal.  Eyes:     Conjunctiva/sclera: Conjunctivae normal.  Cardiovascular:     Rate and  Rhythm: Normal rate and regular rhythm.  Pulmonary:     Effort: Pulmonary effort is normal. No respiratory distress.     Comments: Breathing comfortably  at rest, CTABL, no wheezing, rales or other adventitious sounds auscultated Abdominal:     General: There is no distension.     Comments: Soft, nondistended, tender to palpation in bilateral lower quadrants and supraumbilical area, negative rebound, negative Rovsing, negative McBurney's  Musculoskeletal:        General: Normal range of motion.     Cervical back: Neck supple.  Skin:    General: Skin is warm and dry.  Neurological:     Mental Status: She is alert and oriented to person, place, and time.      UC Treatments / Results  Labs (all labs ordered are listed, but only abnormal results are displayed) Labs Reviewed  CBC  COMPREHENSIVE METABOLIC PANEL  LIPASE  HCG, QUANTITATIVE, PREGNANCY  POCT URINALYSIS DIP (MANUAL ENTRY)  POCT URINE PREGNANCY    EKG   Radiology No results found.  Procedures Procedures (including critical care time)  Medications Ordered in UC Medications - No data to display  Initial Impression / Assessment and Plan / UC Course  I have reviewed the triage vital signs and the nursing notes.  Pertinent labs & imaging results that were available during my care of the patient were reviewed by me and considered in my medical decision making (see chart for details).     Abdominal pain/cramping and nausea-pain initially more suggestive of pelvic etiology given patient's description of cramping as well as symptoms seem relatively on correlated to oral intake.  UA unremarkable, pregnancy test negative.  Declined concerns for STDs.  Checking blood work to check LFTs, lipase and white count along with hCG quant to more definitively rule out pregnancy.  In the meantime treating with Zofran and Naprosyn.  Advised patient to follow-up with OB/GYN if continuing to have cramping to further evaluate for  underlying cysts.  If pain continues to progress patient to follow-up in emergency room for imaging  Discussed strict return precautions. Patient verbalized understanding and is agreeable with plan.  Final Clinical Impressions(s) / UC Diagnoses   Final diagnoses:  Lower abdominal pain     Discharge Instructions     Urine normal Blood work pending Use Zofran dissolved in mouth as needed for nausea/vomiting Increase fluid intake Naprosyn twice daily with food for pain/cramping Follow-up with OB/GYN If pain progressing and worsening please go to emergency room    ED Prescriptions    Medication Sig Dispense Auth. Provider   ondansetron (ZOFRAN ODT) 4 MG disintegrating tablet Take 1-2 tablets (4-8 mg total) by mouth every 8 (eight) hours as needed for nausea or vomiting. 20 tablet Neeka Urista C, PA-C   naproxen (NAPROSYN) 500 MG tablet Take 1 tablet (500 mg total) by mouth 2 (two) times daily. 30 tablet Danisha Brassfield, [place] C, PA-C     PDMP not reviewed this encounter.   Silvia Markuson, [place] C, PA-C 05/03/21 1006

## 2021-05-03 NOTE — ED Triage Notes (Addendum)
Patient presents to Urgent Care with complaints of lower abdominal pain and n/vomiting since 5 days ago. Pt does report changes in meds she has started taking vitamins and iron tablets. Pt also states she had a negative pregnancy test. Treating pain ibuprofen/tylenol.   Denies fever, diarrhea, urinary symptoms, or changes in diet.

## 2021-05-04 LAB — COMPREHENSIVE METABOLIC PANEL
ALT: 12 IU/L (ref 0–32)
AST: 17 IU/L (ref 0–40)
Albumin/Globulin Ratio: 1.1 — ABNORMAL LOW (ref 1.2–2.2)
Albumin: 4.4 g/dL (ref 3.8–4.8)
Alkaline Phosphatase: 103 IU/L (ref 44–121)
BUN/Creatinine Ratio: 14 (ref 9–23)
BUN: 9 mg/dL (ref 6–24)
Bilirubin Total: <0.2 mg/dL (ref 0.0–1.2)
CO2: 23 mmol/L (ref 20–29)
Calcium: 9.5 mg/dL (ref 8.7–10.2)
Chloride: 101 mmol/L (ref 96–106)
Creatinine, Ser: 0.66 mg/dL (ref 0.57–1.00)
Globulin, Total: 4 g/dL (ref 1.5–4.5)
Glucose: 96 mg/dL (ref 65–99)
Potassium: 3.9 mmol/L (ref 3.5–5.2)
Sodium: 138 mmol/L (ref 134–144)
Total Protein: 8.4 g/dL (ref 6.0–8.5)
eGFR: 113 mL/min/{1.73_m2} (ref >59)

## 2021-05-04 LAB — CBC
Hematocrit: 37.8 % (ref 34.0–46.6)
Hemoglobin: 11.2 g/dL (ref 11.1–15.9)
MCH: 23.8 pg — ABNORMAL LOW (ref 26.6–33.0)
MCHC: 29.6 g/dL — ABNORMAL LOW (ref 31.5–35.7)
MCV: 80 fL (ref 79–97)
Platelets: 276 10*3/uL (ref 150–450)
RBC: 4.7 x10E6/uL (ref 3.77–5.28)
RDW: 13.6 % (ref 11.7–15.4)
WBC: 7.8 10*3/uL (ref 3.4–10.8)

## 2021-05-04 LAB — LIPASE: Lipase: 22 U/L (ref 14–72)

## 2021-05-15 ENCOUNTER — Encounter: Payer: Self-pay | Admitting: Family

## 2021-05-15 ENCOUNTER — Telehealth: Payer: 59 | Admitting: Family

## 2021-05-15 DIAGNOSIS — R1010 Upper abdominal pain, unspecified: Secondary | ICD-10-CM

## 2021-05-15 NOTE — Progress Notes (Signed)
   Virtual Visit  Note Due to COVID-19 pandemic this visit was conducted virtually. This visit type was conducted due to national recommendations for restrictions regarding the COVID-19 Pandemic (e.g. social distancing, sheltering in place) in an effort to limit this patient's exposure and mitigate transmission in our community. All issues noted in this document were discussed and addressed.  A physical exam was not performed with this format.  I connected with Ashley Casey on 05/15/21 at 3:04 pm  by video and verified that I am speaking with the correct person using two identifiers. Ashley Casey is currently located at home and her husband and children is currently with her during visit. The provider, Evelina Dun, FNP is located in their office at time of visit.  I discussed the limitations, risks, security and privacy concerns of performing an evaluation and management service by video and the availability of in person appointments. I also discussed with the patient that there may be a patient responsible charge related to this service. The patient expressed understanding and agreed to proceed.   History and Present Illness: Pt presents today with abdominal pain. She reports about 10 mins ago she started having abdominal pain and went to the bathroom. She reports the pain became os severe she fell to the fall and crawled to her children playroom. She is still laying there. She denies any fever, but sweating.  Abdominal Pain This is a new problem. The current episode started today. The onset quality is sudden. The problem occurs constantly. The problem has been unchanged. The pain is located in the RUQ and LUQ. The pain is at a severity of 7/10. The pain is moderate. Pertinent negatives include no belching, constipation, diarrhea, dysuria, fever, flatus, frequency, headaches, hematuria, nausea or vomiting. Nothing aggravates the pain. The pain is relieved by being still and certain positions.  She has tried nothing for the symptoms. The treatment provided no relief.      Review of Systems  Constitutional: Negative for fever.  Gastrointestinal: Positive for abdominal pain. Negative for constipation, diarrhea, flatus, nausea and vomiting.  Genitourinary: Negative for dysuria, frequency and hematuria.  Neurological: Negative for headaches.     Observations/Objective: No SOB noted, patient laying in floor and crying.   Assessment and Plan: 1. Pain of upper abdomen Pt told to go to the Urgent Care given the amount of pain she is in. NPO     I discussed the assessment and treatment plan with the patient. The patient was provided an opportunity to ask questions and all were answered. The patient agreed with the plan and demonstrated an understanding of the instructions.   The patient was advised to call back or seek an in-person evaluation if the symptoms worsen or if the condition fails to improve as anticipated.  The above assessment and management plan was discussed with the patient. The patient verbalized understanding of and has agreed to the management plan. Patient is aware to call the clinic if symptoms persist or worsen. Patient is aware when to return to the clinic for a follow-up visit. Patient educated on when it is appropriate to go to the emergency department.   Time call ended:  3:14 pm   I provided 10 minutes of   face-to-face time during this encounter.    Evelina Dun, FNP

## 2021-05-23 ENCOUNTER — Ambulatory Visit: Payer: 59

## 2021-05-27 ENCOUNTER — Other Ambulatory Visit: Payer: Self-pay

## 2021-05-27 ENCOUNTER — Ambulatory Visit (INDEPENDENT_AMBULATORY_CARE_PROVIDER_SITE_OTHER): Payer: 59

## 2021-05-27 ENCOUNTER — Encounter: Payer: Self-pay | Admitting: Obstetrics

## 2021-05-27 DIAGNOSIS — N912 Amenorrhea, unspecified: Secondary | ICD-10-CM | POA: Diagnosis not present

## 2021-05-27 LAB — POCT URINE PREGNANCY: Preg Test, Ur: POSITIVE — AB

## 2021-05-27 NOTE — Progress Notes (Signed)
Ashley Casey presents today for UPT. She has no unusual complaints. LMP:04/14/21    OBJECTIVE: Appears well, in no apparent distress.  OB History    Gravida  10   Para  7   Term  7   Preterm      AB  3   Living  7     SAB  2   IAB  1   Ectopic      Multiple  0   Live Births  7          Home UPT Result: Positive  In-Office UPT result:+ Positive  I have reviewed the patient's medical, obstetrical, social, and family histories, and medications.   ASSESSMENT: Positive pregnancy test  PLAN Prenatal care to be completed at: [facility].

## 2021-05-27 NOTE — Progress Notes (Signed)
I have reviewed this chart and agree with the RN/CMA assessment and management.    K. Meryl Amatullah Christy, MD, FACOG Attending [facility] (Faculty Practice)  

## 2021-06-11 ENCOUNTER — Other Ambulatory Visit: Payer: Self-pay

## 2021-06-11 ENCOUNTER — Ambulatory Visit: Payer: 59 | Admitting: Physician Assistant

## 2021-06-11 VITALS — BP 108/81 | HR 87 | Temp 98.7°F | Resp 18 | Ht 63.0 in | Wt 177.0 lb

## 2021-06-11 DIAGNOSIS — Z3490 Encounter for supervision of normal pregnancy, unspecified, unspecified trimester: Secondary | ICD-10-CM

## 2021-06-11 DIAGNOSIS — Z8632 Personal history of gestational diabetes: Secondary | ICD-10-CM | POA: Diagnosis not present

## 2021-06-11 DIAGNOSIS — Z13220 Encounter for screening for lipoid disorders: Secondary | ICD-10-CM

## 2021-06-11 DIAGNOSIS — G510 Bell's palsy: Secondary | ICD-10-CM

## 2021-06-11 DIAGNOSIS — Z0000 Encounter for general adult medical examination without abnormal findings: Secondary | ICD-10-CM

## 2021-06-11 DIAGNOSIS — R7303 Prediabetes: Secondary | ICD-10-CM | POA: Diagnosis not present

## 2021-06-11 DIAGNOSIS — Z0289 Encounter for other administrative examinations: Secondary | ICD-10-CM

## 2021-06-11 NOTE — Progress Notes (Signed)
Patient presents for work physical. Patient has not taken medication today and patient has eaten today. Patient denies pain at this time.

## 2021-06-11 NOTE — Patient Instructions (Signed)
Health Maintenance, Female Adopting a healthy lifestyle and getting preventive care are important in promoting health and wellness. Ask your health care provider about: The right schedule for you to have regular tests and exams. Things you can do on your own to prevent diseases and keep yourself healthy. What should I know about diet, weight, and exercise? Eat a healthy diet  Eat a diet that includes plenty of vegetables, fruits, low-fat dairy products, and lean protein. Do not eat a lot of foods that are high in solid fats, added sugars, or sodium.  Maintain a healthy weight Body mass index (BMI) is used to identify weight problems. It estimates body fat based on height and weight. Your health care provider can help determineyour BMI and help you achieve or maintain a healthy weight. Get regular exercise Get regular exercise. This is one of the most important things you can do for your health. Most adults should: Exercise for at least 150 minutes each week. The exercise should increase your heart rate and make you sweat (moderate-intensity exercise). Do strengthening exercises at least twice a week. This is in addition to the moderate-intensity exercise. Spend less time sitting. Even light physical activity can be beneficial. Watch cholesterol and blood lipids Have your blood tested for lipids and cholesterol at 42 years of age, then havethis test every 5 years. Have your cholesterol levels checked more often if: Your lipid or cholesterol levels are high. You are older than 42 years of age. You are at high risk for heart disease. What should I know about cancer screening? Depending on your health history and family history, you may need to have cancer screening at various ages. This may include screening for: Breast cancer. Cervical cancer. Colorectal cancer. Skin cancer. Lung cancer. What should I know about heart disease, diabetes, and high blood pressure? Blood pressure and heart  disease High blood pressure causes heart disease and increases the risk of stroke. This is more likely to develop in people who have high blood pressure readings, are of African descent, or are overweight. Have your blood pressure checked: Every 3-5 years if you are 18-39 years of age. Every year if you are 40 years old or older. Diabetes Have regular diabetes screenings. This checks your fasting blood sugar level. Have the screening done: Once every three years after age 40 if you are at a normal weight and have a low risk for diabetes. More often and at a younger age if you are overweight or have a high risk for diabetes. What should I know about preventing infection? Hepatitis B If you have a higher risk for hepatitis B, you should be screened for this virus. Talk with your health care provider to find out if you are at risk forhepatitis B infection. Hepatitis C Testing is recommended for: Everyone born from 1945 through 1965. Anyone with known risk factors for hepatitis C. Sexually transmitted infections (STIs) Get screened for STIs, including gonorrhea and chlamydia, if: You are sexually active and are younger than 42 years of age. You are older than 42 years of age and your health care provider tells you that you are at risk for this type of infection. Your sexual activity has changed since you were last screened, and you are at increased risk for chlamydia or gonorrhea. Ask your health care provider if you are at risk. Ask your health care provider about whether you are at high risk for HIV. Your health care provider may recommend a prescription medicine to help   prevent HIV infection. If you choose to take medicine to prevent HIV, you should first get tested for HIV. You should then be tested every 3 months for as long as you are taking the medicine. Pregnancy If you are about to stop having your period (premenopausal) and you may become pregnant, seek counseling before you get  pregnant. Take 400 to 800 micrograms (mcg) of folic acid every day if you become pregnant. Ask for birth control (contraception) if you want to prevent pregnancy. Osteoporosis and menopause Osteoporosis is a disease in which the bones lose minerals and strength with aging. This can result in bone fractures. If you are 65 years old or older, or if you are at risk for osteoporosis and fractures, ask your health care provider if you should: Be screened for bone loss. Take a calcium or vitamin D supplement to lower your risk of fractures. Be given hormone replacement therapy (HRT) to treat symptoms of menopause. Follow these instructions at home: Lifestyle Do not use any products that contain nicotine or tobacco, such as cigarettes, e-cigarettes, and chewing tobacco. If you need help quitting, ask your health care provider. Do not use street drugs. Do not share needles. Ask your health care provider for help if you need support or information about quitting drugs. Alcohol use Do not drink alcohol if: Your health care provider tells you not to drink. You are pregnant, may be pregnant, or are planning to become pregnant. If you drink alcohol: Limit how much you use to 0-1 drink a day. Limit intake if you are breastfeeding. Be aware of how much alcohol is in your drink. In the [place] one drink equals one 12 oz bottle of beer (355 mL), one 5 oz glass of wine (148 mL), or one 1 oz glass of hard liquor (44 mL). General instructions Schedule regular health, dental, and eye exams. Stay current with your vaccines. Tell your health care provider if: You often feel depressed. You have ever been abused or do not feel safe at home. Summary Adopting a healthy lifestyle and getting preventive care are important in promoting health and wellness. Follow your health care provider's instructions about healthy diet, exercising, and getting tested or screened for diseases. Follow your health care provider's  instructions on monitoring your cholesterol and blood pressure. This information is not intended to replace advice given to you by your health care provider. Make sure you discuss any questions you have with your healthcare provider. Document Revised: 12/01/2018 Document Reviewed: 12/01/2018 Elsevier Patient Education  2022 Elsevier Inc.  

## 2021-06-11 NOTE — Progress Notes (Signed)
New Patient Office Visit  Subjective:  Patient ID: Ashley Casey, female    DOB: [birth year]  Age: 40 y.o. MRN: [id number]  CC:  Chief Complaint  Patient presents with   health assessment     HPI Ashley Casey presents for for a health assessment related to her employment.  Reports that she is currently 2 months pregnant, does endorse history of gestational diabetes, states that she did have to use insulin during her last pregnancy which was approximately 2 years ago.  Reports that she does track her steps on a daily basis, states that she bowls once a week.  Reports that sleep is good, works on eating a healthy diet, states that she drinks plenty of water on a daily basis.  Endorses that she manages her stress well.  No complaints today.   Past Medical History:  Diagnosis Date   Abnormal Pap smear    Anxiety    in past   Bell's palsy    after 5th pregnancy never resolved   Cholelithiasis    Fatigue    Gestational diabetes    metformin   Gestational diabetes mellitus, antepartum    History of shingles    IBS (irritable bowel syndrome)    Neuromuscular disorder (HCC)     Past Surgical History:  Procedure Laterality Date   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     CHOLECYSTECTOMY  8/29/121   THERAPEUTIC ABORTION     WISDOM TOOTH EXTRACTION      Family History  Problem Relation Age of Onset   Other Mother        sickle cell anemia   Diabetes Mother    Arthritis Brother    Asthma Brother    Diabetes Paternal Grandmother     Social History   Socioeconomic History   Marital status: Married    Spouse name: Not on file   Number of children: Not on file   Years of education: Not on file   Highest education level: Not on file  Occupational History   Not on file  Tobacco Use   Smoking status: Never   Smokeless tobacco: Never  Substance and Sexual Activity   Alcohol use: No   Drug use: No   Sexual activity: Yes    Partners: Male    Birth  control/protection: None  Other Topics Concern   Not on file  Social History Narrative   Not on file   Social Determinants of Health   Financial Resource Strain: Not on file  Food Insecurity: Not on file  Transportation Needs: Not on file  Physical Activity: Not on file  Stress: Not on file  Social Connections: Not on file  Intimate Partner Violence: Not on file    ROS Review of Systems  Constitutional: Negative.   HENT: Negative.    Eyes:  Negative for pain, redness and visual disturbance.  Respiratory:  Negative for shortness of breath.   Cardiovascular:  Negative for chest pain.  Gastrointestinal: Negative.   Endocrine: Negative.   Genitourinary: Negative.   Musculoskeletal: Negative.   Skin: Negative.   Allergic/Immunologic: Negative.   Neurological:  Positive for facial asymmetry.  Hematological: Negative.   Psychiatric/Behavioral: Negative.     Objective:   Today's Vitals: BP 108/81 (BP Location: Left Arm, Patient Position: Sitting, Cuff Size: Normal)   Pulse 87   Temp 98.7 F (37.1 C) (Oral)   Resp 18   Ht 5\' 3"  (1.6 m)   Wt 177 lb (80.3 kg)  LMP 04/14/2021   SpO2 100%   BMI 31.35 kg/m   Physical Exam Vitals and nursing note reviewed.  Constitutional:      Appearance: Normal appearance.  HENT:     Head: Normocephalic and atraumatic.     Right Ear: External ear normal.     Left Ear: External ear normal.     Nose: Nose normal.     Mouth/Throat:     Mouth: Mucous membranes are moist.     Pharynx: Oropharynx is clear.  Eyes:     Extraocular Movements: Extraocular movements intact.     Conjunctiva/sclera: Conjunctivae normal.     Pupils: Pupils are equal, round, and reactive to light.     Comments: Left eyelid droop noted   Cardiovascular:     Rate and Rhythm: Normal rate and regular rhythm.     Pulses: Normal pulses.     Heart sounds: Normal heart sounds.  Pulmonary:     Effort: Pulmonary effort is normal.     Breath sounds: Normal breath  sounds.  Musculoskeletal:        General: Normal range of motion.     Cervical back: Normal range of motion and neck supple.  Skin:    General: Skin is warm and dry.  Neurological:     General: No focal deficit present.     Mental Status: She is alert and oriented to person, place, and time.  Psychiatric:        Mood and Affect: Mood normal.        Behavior: Behavior normal.        Thought Content: Thought content normal.        Judgment: Judgment normal.    Assessment & Plan:   Problem List Items Addressed This Visit       Nervous and Auditory   Left-sided Bell's palsy     Other   Pregnancy   Prediabetes   History of gestational diabetes   Other Visit Diagnoses     Periodic health assessment, general screening, adult    -  Primary   Screening, lipid       Relevant Orders   Lipid panel       Outpatient Encounter Medications as of 06/11/2021  Medication Sig   acetaminophen (TYLENOL) 500 MG tablet Take 1,000 mg by mouth every 6 (six) hours as needed for moderate pain.    cholecalciferol (VITAMIN D3) 25 MCG (1000 UNIT) tablet Take 1,000 Units by mouth daily.   Prenatal Vit-Fe Fumarate-FA (PRENATAL MULTIVITAMIN) TABS tablet Take 1 tablet by mouth daily at 12 noon.   zinc sulfate 220 (50 Zn) MG capsule Take 220 mg by mouth daily.   diphenhydrAMINE (BENADRYL) 25 mg capsule Take 25 mg by mouth every 6 (six) hours as needed.   EPINEPHrine (EPIPEN 2-PAK) 0.3 mg/0.3 mL IJ SOAJ injection Inject 0.3 mLs (0.3 mg total) into the muscle once as needed (for severe allergic reaction). CAll 911 immediately if you have to use this medicine (Patient not taking: Reported on 06/11/2021)   hydrOXYzine (ATARAX/VISTARIL) 25 MG tablet Take 25 mg by mouth 3 (three) times daily as needed. (Patient not taking: Reported on 06/11/2021)   ibuprofen (ADVIL,MOTRIN) 800 MG tablet Take 1 tablet (800 mg total) by mouth 3 (three) times daily. (Patient not taking: Reported on 06/11/2021)   naproxen (NAPROSYN)  500 MG tablet Take 1 tablet (500 mg total) by mouth 2 (two) times daily. (Patient not taking: Reported on 06/11/2021)   ondansetron (ZOFRAN ODT) 4  MG disintegrating tablet Take 1-2 tablets (4-8 mg total) by mouth every 8 (eight) hours as needed for nausea or vomiting. (Patient not taking: Reported on 06/11/2021)   [DISCONTINUED] albuterol (VENTOLIN HFA) 108 (90 Base) MCG/ACT inhaler Inhale 2 puffs into the lungs every 4 (four) hours as needed for wheezing or shortness of breath.   [DISCONTINUED] clonazePAM (KLONOPIN) 0.5 MG tablet Take 1 tablet (0.5 mg total) by mouth 2 (two) times daily as needed for anxiety.   No facility-administered encounter medications on file as of 06/11/2021.  1. Periodic health assessment, general screening, adult Form filled out on patient's behalf  2. Prediabetes A1c 6.0.  Patient education given on low sugar diet, patient does have history of gestational diabetes, who is currently pregnant  3. Screening, lipid   - Lipid panel; Future  4. Pregnancy, unspecified gestational age 25 months pregnant, is followed by OB/GYN  5. Left-sided Bell's palsy Does have residual affect  6. History of gestational diabetes   I have reviewed the patient's medical history (PMH, PSH, Social History, Family History, Medications, and allergies) , and have been updated if relevant. I spent 32 minutes reviewing chart and  face to face time with patient.     Follow-up: Return if symptoms worsen or fail to improve.   Loraine Grip Mayers, PA-C

## 2021-06-12 ENCOUNTER — Other Ambulatory Visit: Payer: Self-pay | Admitting: Physician Assistant

## 2021-06-12 DIAGNOSIS — R7303 Prediabetes: Secondary | ICD-10-CM | POA: Insufficient documentation

## 2021-06-12 DIAGNOSIS — G510 Bell's palsy: Secondary | ICD-10-CM | POA: Insufficient documentation

## 2021-06-12 DIAGNOSIS — Z8632 Personal history of gestational diabetes: Secondary | ICD-10-CM | POA: Insufficient documentation

## 2021-06-13 LAB — LIPID PANEL W/O CHOL/HDL RATIO
Cholesterol, Total: 203 mg/dL — ABNORMAL HIGH (ref 100–199)
HDL: 45 mg/dL (ref >39)
LDL Chol Calc (NIH): 135 mg/dL — ABNORMAL HIGH (ref 0–99)
Triglycerides: 129 mg/dL (ref 0–149)
VLDL Cholesterol Cal: 23 mg/dL (ref 5–40)

## 2021-06-14 ENCOUNTER — Inpatient Hospital Stay (HOSPITAL_COMMUNITY): Payer: 59

## 2021-06-14 ENCOUNTER — Inpatient Hospital Stay (HOSPITAL_COMMUNITY)
Admission: AD | Admit: 2021-06-14 | Discharge: 2021-06-14 | Disposition: A | Discharge status: Home / Self Care | Payer: 59 | Attending: Obstetrics & Gynecology | Admitting: Obstetrics & Gynecology

## 2021-06-14 ENCOUNTER — Ambulatory Visit
Admission: EM | Admit: 2021-06-14 | Discharge: 2021-06-14 | Disposition: A | Discharge status: Other Acute Inpatient Hospital | Payer: 59

## 2021-06-14 ENCOUNTER — Encounter (HOSPITAL_COMMUNITY): Payer: Self-pay | Admitting: Obstetrics & Gynecology

## 2021-06-14 ENCOUNTER — Other Ambulatory Visit: Payer: Self-pay

## 2021-06-14 DIAGNOSIS — O26851 Spotting complicating pregnancy, first trimester: Secondary | ICD-10-CM | POA: Diagnosis present

## 2021-06-14 DIAGNOSIS — Z3A08 8 weeks gestation of pregnancy: Secondary | ICD-10-CM | POA: Diagnosis not present

## 2021-06-14 DIAGNOSIS — Z3A09 9 weeks gestation of pregnancy: Secondary | ICD-10-CM

## 2021-06-14 DIAGNOSIS — O209 Hemorrhage in early pregnancy, unspecified: Secondary | ICD-10-CM

## 2021-06-14 LAB — CBC
HCT: 36.2 % (ref 36.0–46.0)
Hemoglobin: 11.4 g/dL — ABNORMAL LOW (ref 12.0–15.0)
MCH: 25.1 pg — ABNORMAL LOW (ref 26.0–34.0)
MCHC: 31.5 g/dL (ref 30.0–36.0)
MCV: 79.7 fL — ABNORMAL LOW (ref 80.0–100.0)
Platelets: 274 10*3/uL (ref 150–400)
RBC: 4.54 MIL/uL (ref 3.87–5.11)
RDW: 15.1 % (ref 11.5–15.5)
WBC: 9.7 10*3/uL (ref 4.0–10.5)
nRBC: 0 % (ref 0.0–0.2)

## 2021-06-14 LAB — WET PREP, GENITAL
Clue Cells Wet Prep HPF POC: NONE SEEN
Sperm: NONE SEEN
Trich, Wet Prep: NONE SEEN
Yeast Wet Prep HPF POC: NONE SEEN

## 2021-06-14 LAB — HIV ANTIBODY (ROUTINE TESTING W REFLEX): HIV Screen 4th Generation wRfx: NONREACTIVE

## 2021-06-14 LAB — HCG, QUANTITATIVE, PREGNANCY: hCG, Beta Chain, Quant, S: 9917 m[IU]/mL — ABNORMAL HIGH (ref <5)

## 2021-06-14 NOTE — ED Notes (Signed)
Patient is being discharged from the Urgent Care and sent to the MAU. Per [facility], patient is in need of higher level of care due to bleeding and pregnancy. Patient is aware and verbalizes understanding of plan of care. There were no vitals filed for this visit.

## 2021-06-14 NOTE — MAU Note (Signed)
Ashley Casey is a 40 y.o. at [redacted]w[redacted]d here in MAU reporting: last night started having red/brown discharge. Today started with spotting. No pain.  Onset of complaint: last night  Pain score: 0/10  Vitals:   06/14/21 0912  BP: (!) 118/95  Pulse: 97  Resp: 16  Temp: 98.3 F (36.8 C)  SpO2: 100%     Lab orders placed from triage: blood work entered by provider

## 2021-06-14 NOTE — Discharge Instructions (Addendum)
Please go to [facility] MAU - Entrance C at main [facility]

## 2021-06-14 NOTE — Discharge Instructions (Signed)
Return to MAU: If you have heavier bleeding that soaks through more that 2 pads per hour for an hour or more If you bleed so much that you feel like you might pass out or you do pass out If you have significant abdominal pain that is not improved with Tylenol 1000 mg every 6 hours as needed for pain If you develop a fever > 100.5

## 2021-06-14 NOTE — MAU Provider Note (Signed)
History     CSN: [id number]  Arrival date and time: 06/14/21 0847   Event Date/Time   First Provider Initiated Contact with Patient 06/14/21 [phone]      Chief Complaint  Patient presents with   Vaginal Bleeding   Ashley Casey is a 40 y.o. year old [id number] female at [redacted]w[redacted]d weeks gestation who presents to MAU reporting reddish-brown discharge last night, but BRB this morning. She denies any pain. She had a (+) HPT on 05/27/2021. She had a HCG with pending results on 05/03/2021.   OB History     Gravida  11   Para  7   Term  7   Preterm      AB  3   Living  7      SAB  2   IAB  1   Ectopic      Multiple  0   Live Births  7           Past Medical History:  Diagnosis Date   Abnormal Pap smear    Anxiety    in past   Bell's palsy    after 5th pregnancy never resolved   Cholelithiasis    Fatigue    Gestational diabetes    metformin   Gestational diabetes mellitus, antepartum    History of shingles    IBS (irritable bowel syndrome)    Neuromuscular disorder (HCC)     Past Surgical History:  Procedure Laterality Date   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     CHOLECYSTECTOMY  8/29/121   THERAPEUTIC ABORTION     WISDOM TOOTH EXTRACTION      Family History  Problem Relation Age of Onset   Other Mother        sickle cell anemia   Diabetes Mother    Sickle cell anemia Mother    Arthritis Brother    Asthma Brother    Diabetes Maternal Grandmother    Diabetes Paternal Grandmother     Social History   Tobacco Use   Smoking status: Never   Smokeless tobacco: Never  Substance Use Topics   Alcohol use: No   Drug use: No    Allergies:  Allergies  Allergen Reactions   Food Anaphylaxis and Other (See Comments)    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     Tegretol [Carbamazepine] Other (See Comments)    Reaction:  Seizures     Medications Prior to Admission  Medication Sig Dispense Refill Last Dose   Ascorbic  Acid (VITAMIN C) 100 MG tablet Take 100 mg by mouth daily. Unsure about dose   Past Week   cholecalciferol (VITAMIN D3) 25 MCG (1000 UNIT) tablet Take 1,000 Units by mouth daily.   Past Week   ferrous sulfate 325 (65 FE) MG tablet Take 325 mg by mouth daily with breakfast.   Past Week   Prenatal Vit-Fe Fumarate-FA (PRENATAL MULTIVITAMIN) TABS tablet Take 1 tablet by mouth daily at 12 noon.   06/13/2021   zinc sulfate 220 (50 Zn) MG capsule Take 1,000 mg by mouth daily.   Past Week   acetaminophen (TYLENOL) 500 MG tablet Take 1,000 mg by mouth every 6 (six) hours as needed for moderate pain.    More than a month   EPINEPHrine (EPIPEN 2-PAK) 0.3 mg/0.3 mL IJ SOAJ injection Inject 0.3 mLs (0.3 mg total) into the muscle once as needed (for severe allergic reaction). CAll 911 immediately if you have to  use this medicine (Patient not taking: No sig reported) 1 Device 1 More than a month    Review of Systems  Constitutional: Negative.   HENT: Negative.    Eyes: Negative.   Respiratory: Negative.    Cardiovascular: Negative.   Gastrointestinal: Negative.   Endocrine: Negative.   Genitourinary:  Positive for vaginal bleeding.  Musculoskeletal: Negative.   Skin: Negative.   Allergic/Immunologic: Negative.   Neurological: Negative.   Hematological: Negative.   Psychiatric/Behavioral: Negative.    Physical Exam   Blood pressure (!) 118/95, pulse 97, temperature 98.3 F (36.8 C), temperature source Oral, resp. rate 16, height 5\' 3"  (1.6 m), weight 80.8 kg, last menstrual period 04/14/2021, SpO2 100 %, unknown if currently breastfeeding.  Physical Exam Vitals and nursing note reviewed. Exam conducted with a chaperone present.  Constitutional:      Appearance: Normal appearance. She is normal weight.  Cardiovascular:     Rate and Rhythm: Normal rate.  Pulmonary:     Effort: Pulmonary effort is normal.  Abdominal:     General: Abdomen is flat.  Genitourinary:    General: Normal vulva.      Comments: Pelvic exam: External genitalia normal, SE: vaginal walls pink and well rugated, cervix is smooth, pink, no lesions, small amt of pinkish-tan vaginal d/c -- WP, GC/CT done, cervix visually closed, Uterus is non-tender, no CMT or friability, no adnexal tenderness.  Neurological:     Mental Status: She is alert and oriented to person, place, and time.  Psychiatric:        Mood and Affect: Mood normal.        Behavior: Behavior normal.        Thought Content: Thought content normal.        Judgment: Judgment normal.    MAU Course  Procedures  MDM CCUA UPT CBC ABO/Rh -- not drawn, known O POS HCG Wet Prep GC/CT -- pending HIV -- pending OB < 14 wks [place] with TV  Results for orders placed or performed during the hospital encounter of 06/14/21 (from the past 24 hour(s))  CBC     Status: Abnormal   Collection Time: 06/14/21  9:29 AM  Result Value Ref Range   WBC 9.7 4.0 - 10.5 K/uL   RBC 4.54 3.87 - 5.11 MIL/uL   Hemoglobin 11.4 (L) 12.0 - 15.0 g/dL   HCT 36.2 36.0 - 46.0 %   MCV 79.7 (L) 80.0 - 100.0 fL   MCH 25.1 (L) 26.0 - 34.0 pg   MCHC 31.5 30.0 - 36.0 g/dL   RDW 15.1 11.5 - 15.5 %   Platelets 274 150 - 400 K/uL   nRBC 0.0 0.0 - 0.2 %  hCG, quantitative, pregnancy     Status: Abnormal   Collection Time: 06/14/21  9:29 AM  Result Value Ref Range   hCG, Beta Chain, Quant, S 9,917 (H) <5 mIU/mL  HIV Antibody (routine testing w rflx)     Status: None   Collection Time: 06/14/21  9:29 AM  Result Value Ref Range   HIV Screen 4th Generation wRfx Non Reactive Non Reactive  Wet prep, genital     Status: Abnormal   Collection Time: 06/14/21  9:48 AM  Result Value Ref Range   Yeast Wet Prep HPF POC NONE SEEN NONE SEEN   Trich, Wet Prep NONE SEEN NONE SEEN   Clue Cells Wet Prep HPF POC NONE SEEN NONE SEEN   WBC, Wet Prep HPF POC MANY (A) NONE SEEN  Sperm NONE SEEN     US OB LESS THAN 14 WEEKS WITH OB TRANSVAGINAL  Result Date: 06/14/2021 CLINICAL DATA:  Vaginal  bleeding affecting 1st trimester pregnancy. EXAM: OBSTETRIC <14 WK [place] AND TRANSVAGINAL OB US TECHNIQUE: Both transabdominal and transvaginal ultrasound examinations were performed for complete evaluation of the gestation as well as the maternal uterus, adnexal regions, and pelvic cul-de-sac. Transvaginal technique was performed to assess early pregnancy. COMPARISON:  None. FINDINGS: Intrauterine gestational sac: Single Yolk sac:  Visualized. Embryo:  Visualized. Cardiac Activity: Visualized. Heart Rate: 115  bpm CRL:  14  mm   7 w   5 d                  [place] EDC: 01/26/2022 Subchorionic hemorrhage:  None visualized. Maternal uterus/adnexae: Small right ovarian corpus luteum cyst noted. Normal appearance of left ovary. No mass or abnormal free fluid identified. IMPRESSION: Single living IUP with estimated gestational age of [redacted] weeks 5 days, and [place] EDC of 01/26/2022. No maternal uterine or adnexal abnormality identified. Electronically Signed   By: Marlaine Hind M.D.   On: 06/14/2021 12:52     Assessment and Plan  Spotting affecting pregnancy in first trimester  - Information provided on vaginal bleeding in pregnancy - Return to MAU: If you have heavier bleeding that soaks through more that 2 pads per hour for an hour or more If you bleed so much that you feel like you might pass out or you do pass out If you have significant abdominal pain that is not improved with Tylenol 1000 mg every 6 hours as needed for pain If you develop a fever > 100.5   [redacted] weeks gestation of pregnancy  - Discharge patient, keep scheduled appt with [facility] on 06/19/21, Patient verbalized an understanding of the plan of care and agrees.  Laury Deep, CNM 06/14/2021, 11:20 AM

## 2021-06-16 ENCOUNTER — Telehealth: Payer: 59

## 2021-06-17 ENCOUNTER — Telehealth: Payer: Self-pay

## 2021-06-17 LAB — GC/CHLAMYDIA PROBE AMP (~~LOC~~) NOT AT ARMC
Chlamydia: NEGATIVE
Comment: NEGATIVE
Comment: NORMAL
Neisseria Gonorrhea: NEGATIVE

## 2021-06-17 NOTE — Telephone Encounter (Signed)
TC from patient, she reported some clotting that started last night.She is concerned. Patient does not report any sexual activity in the 24 hours. She thinks the bleeding is getting worse and requested to have another ultrasound to compare to the previous one.I have advised her to follow back up at MAU to recheck her bleeding. Patient voice understanding at this time.

## 2021-06-18 ENCOUNTER — Other Ambulatory Visit: Payer: Self-pay

## 2021-06-18 ENCOUNTER — Inpatient Hospital Stay (HOSPITAL_COMMUNITY)
Admission: AD | Admit: 2021-06-18 | Discharge: 2021-06-19 | Disposition: A | Discharge status: Home / Self Care | Payer: 59 | Attending: Obstetrics and Gynecology | Admitting: Obstetrics and Gynecology

## 2021-06-18 DIAGNOSIS — Z3A09 9 weeks gestation of pregnancy: Secondary | ICD-10-CM | POA: Insufficient documentation

## 2021-06-18 DIAGNOSIS — O039 Complete or unspecified spontaneous abortion without complication: Secondary | ICD-10-CM

## 2021-06-18 DIAGNOSIS — Z79899 Other long term (current) drug therapy: Secondary | ICD-10-CM | POA: Insufficient documentation

## 2021-06-18 DIAGNOSIS — O209 Hemorrhage in early pregnancy, unspecified: Secondary | ICD-10-CM | POA: Insufficient documentation

## 2021-06-18 DIAGNOSIS — O26891 Other specified pregnancy related conditions, first trimester: Secondary | ICD-10-CM | POA: Insufficient documentation

## 2021-06-18 DIAGNOSIS — O09521 Supervision of elderly multigravida, first trimester: Secondary | ICD-10-CM | POA: Insufficient documentation

## 2021-06-18 DIAGNOSIS — O0941 Supervision of pregnancy with grand multiparity, first trimester: Secondary | ICD-10-CM | POA: Insufficient documentation

## 2021-06-18 DIAGNOSIS — R9389 Abnormal findings on diagnostic imaging of other specified body structures: Secondary | ICD-10-CM | POA: Insufficient documentation

## 2021-06-19 ENCOUNTER — Encounter (HOSPITAL_COMMUNITY): Payer: Self-pay | Admitting: Obstetrics and Gynecology

## 2021-06-19 ENCOUNTER — Inpatient Hospital Stay (HOSPITAL_COMMUNITY): Payer: 59

## 2021-06-19 DIAGNOSIS — O039 Complete or unspecified spontaneous abortion without complication: Secondary | ICD-10-CM | POA: Diagnosis not present

## 2021-06-19 DIAGNOSIS — R9389 Abnormal findings on diagnostic imaging of other specified body structures: Secondary | ICD-10-CM | POA: Diagnosis not present

## 2021-06-19 DIAGNOSIS — O26891 Other specified pregnancy related conditions, first trimester: Secondary | ICD-10-CM | POA: Diagnosis not present

## 2021-06-19 DIAGNOSIS — O0941 Supervision of pregnancy with grand multiparity, first trimester: Secondary | ICD-10-CM | POA: Diagnosis not present

## 2021-06-19 DIAGNOSIS — O09521 Supervision of elderly multigravida, first trimester: Secondary | ICD-10-CM | POA: Diagnosis not present

## 2021-06-19 DIAGNOSIS — Z3A09 9 weeks gestation of pregnancy: Secondary | ICD-10-CM | POA: Diagnosis not present

## 2021-06-19 DIAGNOSIS — O209 Hemorrhage in early pregnancy, unspecified: Secondary | ICD-10-CM | POA: Diagnosis not present

## 2021-06-19 DIAGNOSIS — Z79899 Other long term (current) drug therapy: Secondary | ICD-10-CM | POA: Diagnosis not present

## 2021-06-19 LAB — CBC WITH DIFFERENTIAL/PLATELET
Abs Immature Granulocytes: 0.03 10*3/uL (ref 0.00–0.07)
Basophils Absolute: 0 10*3/uL (ref 0.0–0.1)
Basophils Relative: 0 %
Eosinophils Absolute: 0.2 10*3/uL (ref 0.0–0.5)
Eosinophils Relative: 2 %
HCT: 39.5 % (ref 36.0–46.0)
Hemoglobin: 11.9 g/dL — ABNORMAL LOW (ref 12.0–15.0)
Immature Granulocytes: 0 %
Lymphocytes Relative: 35 %
Lymphs Abs: 3.2 10*3/uL (ref 0.7–4.0)
MCH: 24.8 pg — ABNORMAL LOW (ref 26.0–34.0)
MCHC: 30.1 g/dL (ref 30.0–36.0)
MCV: 82.3 fL (ref 80.0–100.0)
Monocytes Absolute: 0.6 10*3/uL (ref 0.1–1.0)
Monocytes Relative: 6 %
Neutro Abs: 5.2 10*3/uL (ref 1.7–7.7)
Neutrophils Relative %: 57 %
Platelets: 277 10*3/uL (ref 150–400)
RBC: 4.8 MIL/uL (ref 3.87–5.11)
RDW: 15.3 % (ref 11.5–15.5)
WBC: 9.2 10*3/uL (ref 4.0–10.5)
nRBC: 0 % (ref 0.0–0.2)

## 2021-06-19 MED ORDER — IBUPROFEN 800 MG PO TABS: 800.0000 mg | ORAL_TABLET | Freq: Three times a day (TID) | ORAL | 0 refills | Status: DC | PRN

## 2021-06-19 MED ORDER — MISOPROSTOL 200 MCG PO TABS: 400.0000 ug | ORAL_TABLET | Freq: Once | ORAL | 0 refills | Status: DC

## 2021-06-19 NOTE — MAU Provider Note (Signed)
History  Chief Complaint:  Vaginal Bleeding  Ashley Casey is a 40 y.o. [id number] female at [redacted]w[redacted]d presenting w/ report of bright red bleeding and passing clots today. Seen 6/24 in MAU for spotting/bleeding w/o pain. Quant 9,917, wet prep and gc/ct neg, Rh O+. U/S showed IUP w/ CRL [redacted]w[redacted]d and +FCA 115bpm. States she has had bleeding ever since, but increased today and this evening. Still no pain/cramping. Has passed multiple large clots. Denies dizziness, lightheadedness. Denies uti s/s, abnormal/malodorous vag d/c or vulvovaginal itching/irritation.   Prenatal care: has new ob at [facility] scheduled later today  Obstetrical History: OB History     Gravida  12   Para  7   Term  7   Preterm      AB  3   Living  7      SAB  2   IAB  1   Ectopic      Multiple  0   Live Births  7           Past Medical History: Past Medical History:  Diagnosis Date   Abnormal Pap smear    Anxiety    in past   Bell's palsy    after 5th pregnancy never resolved   Cholelithiasis    Fatigue    Gestational diabetes    metformin   Gestational diabetes mellitus, antepartum    History of shingles    IBS (irritable bowel syndrome)    Neuromuscular disorder (HCC)     Past Surgical History: Past Surgical History:  Procedure Laterality Date   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     CHOLECYSTECTOMY  8/29/121   THERAPEUTIC ABORTION     WISDOM TOOTH EXTRACTION      Social History: Social History   Socioeconomic History   Marital status: Married    Spouse name: Not on file   Number of children: Not on file   Years of education: Not on file   Highest education level: Not on file  Occupational History   Not on file  Tobacco Use   Smoking status: Never   Smokeless tobacco: Never  Substance and Sexual Activity   Alcohol use: No   Drug use: No   Sexual activity: Yes    Partners: Male    Birth control/protection: None  Other Topics Concern   Not on file  Social History  Narrative   Not on file   Social Determinants of Health   Financial Resource Strain: Not on file  Food Insecurity: Not on file  Transportation Needs: Not on file  Physical Activity: Not on file  Stress: Not on file  Social Connections: Not on file    Allergies: Allergies  Allergen Reactions   Food Anaphylaxis and Other (See Comments)    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     Tegretol [Carbamazepine] Other (See Comments)    Reaction:  Seizures     Medications Prior to Admission  Medication Sig Dispense Refill Last Dose   Ascorbic Acid (VITAMIN C) 100 MG tablet Take 100 mg by mouth daily. Unsure about dose   06/18/2021   cholecalciferol (VITAMIN D3) 25 MCG (1000 UNIT) tablet Take 1,000 Units by mouth daily.   06/18/2021   ferrous sulfate 325 (65 FE) MG tablet Take 325 mg by mouth daily with breakfast.   06/18/2021   Prenatal Vit-Fe Fumarate-FA (PRENATAL MULTIVITAMIN) TABS tablet Take 1 tablet by mouth daily at 12 noon.   06/18/2021  zinc sulfate 220 (50 Zn) MG capsule Take 100 mg by mouth daily.   06/18/2021   acetaminophen (TYLENOL) 500 MG tablet Take 1,000 mg by mouth every 6 (six) hours as needed for moderate pain.    More than a month   EPINEPHrine (EPIPEN 2-PAK) 0.3 mg/0.3 mL IJ SOAJ injection Inject 0.3 mLs (0.3 mg total) into the muscle once as needed (for severe allergic reaction). CAll 911 immediately if you have to use this medicine (Patient not taking: No sig reported) 1 Device 1     Review of Systems  Pertinent pos/neg as indicated in HPI  Physical Exam  Blood pressure 117/81, pulse 86, temperature 98.4 F (36.9 C), temperature source Oral, resp. rate 16, height 5\' 3"  (1.6 m), weight 80.7 kg, last menstrual period 04/14/2021, SpO2 99 %, unknown if currently breastfeeding. General appearance: alert, cooperative, and tearful Lungs: clear to auscultation bilaterally, normal effort Heart: regular rate and rhythm Abdomen: non-tender  Sitting on  toilet, passing clots Spec exam: vaginal vault full of BRB and small , cleared w/ fox swabs, cx visually closed Cultures/Specimens: none (done 6/24)   MAU Course  Spec exam CBC U/S:  FINDINGS: Intrauterine gestational sac: None Yolk sac:  Not Visualized. Embryo:  Not Visualized. Cardiac Activity: Not Visualized. Heart Rate:  bpm MSD:    mm    w     d CRL:     mm    w  d                  [place] EDC: Subchorionic hemorrhage:  None visualized. Maternal uterus/adnexae: Previously seen intrauterine gestation no longer visualized. No adnexal mass or free fluid. Endometrium is thickened and heterogeneous, measuring approximately 23 mm in thickness. IMPRESSION: Previously seen intrauterine gestational longer visualized compatible with spontaneous abortion. Thickened, heterogeneous endometrium could reflect blood products or retained products of conception.   Electronically Signed   By: Rolm Baptise M.D.   On: 06/19/2021 01:00  Labs:  Results for orders placed or performed during the hospital encounter of 06/18/21 (from the past 24 hour(s))  CBC with Differential     Status: Abnormal   Collection Time: 06/19/21  1:35 AM  Result Value Ref Range   WBC 9.2 4.0 - 10.5 K/uL   RBC 4.80 3.87 - 5.11 MIL/uL   Hemoglobin 11.9 (L) 12.0 - 15.0 g/dL   HCT 39.5 36.0 - 46.0 %   MCV 82.3 80.0 - 100.0 fL   MCH 24.8 (L) 26.0 - 34.0 pg   MCHC 30.1 30.0 - 36.0 g/dL   RDW 15.3 11.5 - 15.5 %   Platelets 277 150 - 400 K/uL   nRBC 0.0 0.0 - 0.2 %   Neutrophils Relative % 57 %   Neutro Abs 5.2 1.7 - 7.7 K/uL   Lymphocytes Relative 35 %   Lymphs Abs 3.2 0.7 - 4.0 K/uL   Monocytes Relative 6 %   Monocytes Absolute 0.6 0.1 - 1.0 K/uL   Eosinophils Relative 2 %   Eosinophils Absolute 0.2 0.0 - 0.5 K/uL   Basophils Relative 0 %   Basophils Absolute 0.0 0.0 - 0.1 K/uL   Immature Granulocytes 0 %   Abs Immature Granulocytes 0.03 0.00 - 0.07 K/uL   Discussed all w/ Dr. Elgie Congo, d/c home, rx cytotec for  thickened endometrium.  Assessment and Plan  A:  [redacted]w[redacted]d SIUP  [id number]  Completed SAB, Rh+, Hgb stable  Thickened endometrium 39mm P:  D/C home  Rx cytotec for thickened  endometrium  Rx ibuprofen per pt request  Note routed to [facility] to cancel today's new ob intake and schedule f/u in 1wk in office  Reviewed warning s/s, reasons to return/seek care prior to f/u  Work note given, return next Monday    Roma Schanz CNM,WHNP-BC 6/29/20222:19 AM

## 2021-06-19 NOTE — MAU Note (Signed)
..  Ashley Casey is a 40 y.o. at [redacted]w[redacted]d here in MAU reporting: vaginal bleeding on and off since Friday. Today she began having large clots. She has saturated a pad in the past hour. Reports abdominal cramping but does have a "weird" abdominal pain that she describes as pressure.    Pain score: 4/10 Vitals:   06/19/21 0008  BP: 117/81  Pulse: 86  Resp: 16  Temp: 98.4 F (36.9 C)  SpO2: 99%

## 2021-06-20 ENCOUNTER — Telehealth: Payer: Self-pay | Admitting: *Deleted

## 2021-06-20 NOTE — Telephone Encounter (Signed)
-----   Message from Kennieth Rad, [place] sent at 06/13/2021 10:33 AM EDT ----- Please call patient and let her know that her cholesterol is elevated, including her LDL.  Her risk of a cardiovascular event in the next 10 years is very low, however I strongly recommend that she follow a low-cholesterol diet and have her cholesterol rechecked again in 1 year.  The 10-year ASCVD risk score Mikey Bussing DC Brooke Bonito., et al., 2013) is: 0.4%   Values used to calculate the score:     Age: 40 years     Sex: Female     Is Non-Hispanic African American: Yes     Diabetic: No     Tobacco smoker: No     Systolic Blood Pressure: 762 mmHg     Is BP treated: No     HDL Cholesterol: 45 mg/dL     Total Cholesterol: 203 mg/dL

## 2021-06-20 NOTE — Telephone Encounter (Signed)
Medical Assistant left message on patient's home and cell voicemail. Voicemail states to give a call back to [place] with [facility] at [phone]. Patient viewed via mychart and may call back if she has concerns.

## 2021-06-25 ENCOUNTER — Ambulatory Visit: Payer: 59 | Admitting: Obstetrics and Gynecology

## 2021-07-26 ENCOUNTER — Telehealth: Payer: 59 | Admitting: Physician Assistant

## 2021-07-26 ENCOUNTER — Encounter: Payer: Self-pay | Admitting: Physician Assistant

## 2021-07-26 DIAGNOSIS — K649 Unspecified hemorrhoids: Secondary | ICD-10-CM

## 2021-07-26 MED ORDER — HYDROCORTISONE ACETATE 25 MG RE SUPP: 25.0000 mg | Freq: Two times a day (BID) | RECTAL | 0 refills | Status: DC

## 2021-07-26 MED ORDER — HYDROCORT-PRAMOXINE (PERIANAL) 1-1 % EX FOAM: 1.0000 | Freq: Two times a day (BID) | CUTANEOUS | 0 refills | Status: DC

## 2021-07-26 NOTE — Patient Instructions (Signed)
Ashley Casey, thank you for joining Mar Daring, PA-C for today's virtual visit.  While this provider is not your primary care provider (PCP), if your PCP is located in our provider database this encounter information will be shared with them immediately following your visit.  Consent: (Patient) Ashley Casey provided verbal consent for this virtual visit at the beginning of the encounter.  Current Medications:  Current Outpatient Medications:    hydrocortisone (ANUSOL-HC) 25 MG suppository, Place 1 suppository (25 mg total) rectally 2 (two) times daily., Disp: 12 suppository, Rfl: 0   hydrocortisone-pramoxine (PROCTOFOAM-HC) rectal foam, Place 1 applicator rectally 2 (two) times daily., Disp: 10 g, Rfl: 0   acetaminophen (TYLENOL) 500 MG tablet, Take 1,000 mg by mouth every 6 (six) hours as needed for moderate pain. , Disp: , Rfl:    Ascorbic Acid (VITAMIN C) 100 MG tablet, Take 100 mg by mouth daily. Unsure about dose, Disp: , Rfl:    cholecalciferol (VITAMIN D3) 25 MCG (1000 UNIT) tablet, Take 1,000 Units by mouth daily., Disp: , Rfl:    EPINEPHrine (EPIPEN 2-PAK) 0.3 mg/0.3 mL IJ SOAJ injection, Inject 0.3 mLs (0.3 mg total) into the muscle once as needed (for severe allergic reaction). CAll 911 immediately if you have to use this medicine (Patient not taking: No sig reported), Disp: 1 Device, Rfl: 1   ferrous sulfate 325 (65 FE) MG tablet, Take 325 mg by mouth daily with breakfast., Disp: , Rfl:    ibuprofen (ADVIL) 800 MG tablet, Take 1 tablet (800 mg total) by mouth every 8 (eight) hours as needed for moderate pain, cramping or mild pain., Disp: 20 tablet, Rfl: 0   misoprostol (CYTOTEC) 200 MCG tablet, Take 2 tablets (400 mcg total) by mouth once for 1 dose. Repeat in 48 hours if needed, Disp: 2 tablet, Rfl: 0   zinc sulfate 220 (50 Zn) MG capsule, Take 100 mg by mouth daily., Disp: , Rfl:    Medications ordered in this encounter:  Meds ordered this encounter  Medications    hydrocortisone (ANUSOL-HC) 25 MG suppository    Sig: Place 1 suppository (25 mg total) rectally 2 (two) times daily.    Dispense:  12 suppository    Refill:  0    Order Specific Question:   Supervising Provider    Answer:   MILLER, BRIAN [[id number]]   hydrocortisone-pramoxine (PROCTOFOAM-HC) rectal foam    Sig: Place 1 applicator rectally 2 (two) times daily.    Dispense:  10 g    Refill:  0    Order Specific Question:   Supervising Provider    Answer:   Sabra Heck, Many     *If you need refills on other medications prior to your next appointment, please contact your pharmacy*  Follow-Up: Call back or seek an in-person evaluation if the symptoms worsen or if the condition fails to improve as anticipated.  Other Instructions See below   If you have been instructed to have an in-person evaluation today at a local Urgent Care facility, please use the link below. It will take you to a list of all of our available [facility], including address, phone number and hours of operation. Please do not delay care.  [facility]  If you or a family member do not have a primary care provider, use the link below to schedule a visit and establish care. When you choose a [facility] primary care physician or advanced practice provider, you gain a  long-term partner in health. Find a Primary Care Provider  Learn more about [facility]'s in-office and virtual care options: [facility]  Hemorrhoids Hemorrhoids are swollen veins in and around the rectum or anus. There are two types of hemorrhoids: Internal hemorrhoids. These occur in the veins that are just inside the rectum. They may poke through to the outside and become irritated and painful. External hemorrhoids. These occur in the veins that are outside the anus and can be felt as a painful swelling or hard lump near the anus. Most hemorrhoids do not cause serious problems, and they can be managed with  home treatments such as diet and lifestyle changes. If home treatments do nothelp the symptoms, procedures can be done to shrink or remove the hemorrhoids. What are the causes? This condition is caused by increased pressure in the anal area. This pressure may result from various things, including: Constipation. Straining to have a bowel movement. Diarrhea. Pregnancy. Obesity. Sitting for long periods of time. Heavy lifting or other activity that causes you to strain. Anal sex. Riding a bike for a long period of time. What are the signs or symptoms? Symptoms of this condition include: Pain. Anal itching or irritation. Rectal bleeding. Leakage of stool (feces). Anal swelling. One or more lumps around the anus. How is this diagnosed? This condition can often be diagnosed through a visual exam. Other exams or tests may also be done, such as: An exam that involves feeling the rectal area with a gloved hand (digital rectal exam). An exam of the anal canal that is done using a small tube (anoscope). A blood test, if you have lost a significant amount of blood. A test to look inside the colon using a flexible tube with a camera on the end (sigmoidoscopy or colonoscopy). How is this treated? This condition can usually be treated at home. However, various procedures may be done if dietary changes, lifestyle changes, and other home treatments do not help your symptoms. These procedures can help make the hemorrhoids smaller or remove them completely. Some of these procedures involve surgery, and others do not. Common procedures include: Rubber band ligation. Rubber bands are placed at the base of the hemorrhoids to cut off their blood supply. Sclerotherapy. Medicine is injected into the hemorrhoids to shrink them. Infrared coagulation. A type of light energy is used to get rid of the hemorrhoids. Hemorrhoidectomy surgery. The hemorrhoids are surgically removed, and the veins that supply them are  tied off. Stapled hemorrhoidopexy surgery. The surgeon staples the base of the hemorrhoid to the rectal wall. Follow these instructions at home: Eating and drinking  Eat foods that have a lot of fiber in them, such as whole grains, beans, nuts, fruits, and vegetables. Ask your health care provider about taking products that have added fiber (fiber supplements). Reduce the amount of fat in your diet. You can do this by eating low-fat dairy products, eating less red meat, and avoiding processed foods. Drink enough fluid to keep your urine pale yellow.  Managing pain and swelling  Take warm sitz baths for 20 minutes, 3-4 times a day to ease pain and discomfort. You may do this in a bathtub or using a portable sitz bath that fits over the toilet. If directed, apply ice to the affected area. Using ice packs between sitz baths may be helpful. Put ice in a plastic bag. Place a towel between your skin and the bag. Leave the ice on for 20 minutes, 2-3  times a day.  General instructions Take over-the-counter and prescription medicines only as told by your health care provider. Use medicated creams or suppositories as told. Get regular exercise. Ask your health care provider how much and what kind of exercise is best for you. In general, you should do moderate exercise for at least 30 minutes on most days of the week (150 minutes each week). This can include activities such as walking, biking, or yoga. Go to the bathroom when you have the urge to have a bowel movement. Do not wait. Avoid straining to have bowel movements. Keep the anal area dry and clean. Use wet toilet paper or moist towelettes after a bowel movement. Do not sit on the toilet for long periods of time. This increases blood pooling and pain. Keep all follow-up visits as told by your health care provider. This is important. Contact a health care provider if you have: Increasing pain and swelling that are not controlled by treatment or  medicine. Difficulty having a bowel movement, or you are unable to have a bowel movement. Pain or inflammation outside the area of the hemorrhoids. Get help right away if you have: Uncontrolled bleeding from your rectum. Summary Hemorrhoids are swollen veins in and around the rectum or anus. Most hemorrhoids can be managed with home treatments such as diet and lifestyle changes. Taking warm sitz baths can help ease pain and discomfort. In severe cases, procedures or surgery can be done to shrink or remove the hemorrhoids. This information is not intended to replace advice given to you by your health care provider. Make sure you discuss any questions you have with your healthcare provider. Document Revised: 05/06/2019 Document Reviewed: 04/29/2018 Elsevier Patient Education  [place].

## 2021-07-26 NOTE — Progress Notes (Signed)
Virtual Visit Consent   Ashley Casey, you are scheduled for a virtual visit with a [facility] provider today.     Just as with appointments in the office, your consent must be obtained to participate.  Your consent will be active for this visit and any virtual visit you may have with one of our providers in the next 365 days.     If you have a MyChart account, a copy of this consent can be sent to you electronically.  All virtual visits are billed to your insurance company just like a traditional visit in the office.    As this is a virtual visit, video technology does not allow for your provider to perform a traditional examination.  This may limit your provider's ability to fully assess your condition.  If your provider identifies any concerns that need to be evaluated in person or the need to arrange testing (such as labs, EKG, etc.), we will make arrangements to do so.     Although advances in technology are sophisticated, we cannot ensure that it will always work on either your end or our end.  If the connection with a video visit is poor, the visit may have to be switched to a telephone visit.  With either a video or telephone visit, we are not always able to ensure that we have a secure connection.     I need to obtain your verbal consent now.   Are you willing to proceed with your visit today?    Ashley Casey has provided verbal consent on 07/26/2021 for a virtual visit (video or telephone).   Mar Daring, PA-C   Date: 07/26/2021 6:02 PM   Virtual Visit via Video Note   I, Mar Daring, connected with  Ashley Casey  ([id number], 1981-08-26) on 07/26/21 at  6:00 PM EDT by a video-enabled telemedicine application and verified that I am speaking with the correct person using two identifiers.  Location: Patient: Virtual Visit Location Patient: Home Provider: Virtual Visit Location Provider: Home Office   I discussed the limitations of evaluation and management  by telemedicine and the availability of in person appointments. The patient expressed understanding and agreed to proceed.    History of Present Illness: Ashley Casey is a 40 y.o. who identifies as a female who was assigned female at birth, and is being seen today for hemorrhoids. She reports she has had hemorrhoids in the past, but it was after her pregnancy a while back. She is able to have BM, just having some pain with them and having to strain some. She has not had any rectal bleeding. Denies melena and hematochezia. Has tried Tucks pads and preparation H OTC. She does have a Sitz bath kit she has not used yet. She has used Proctofoam with previous hemorrhoids successfully.    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     Tegretol [Carbamazepine] Other (See Comments)    Reaction:  Seizures    Medications:  Current Outpatient Medications:    hydrocortisone (ANUSOL-HC) 25 MG suppository, Place 1 suppository (25  mg total) rectally 2 (two) times daily., Disp: 12 suppository, Rfl: 0   hydrocortisone-pramoxine (PROCTOFOAM-HC) rectal foam, Place 1 applicator rectally 2 (two) times daily., Disp: 10 g, Rfl: 0   acetaminophen (TYLENOL) 500 MG tablet, Take 1,000 mg by mouth every 6 (six) hours as needed for moderate pain. , Disp: , Rfl:    Ascorbic Acid (VITAMIN C) 100 MG tablet, Take 100 mg by mouth daily. Unsure about dose, Disp: , Rfl:    cholecalciferol (VITAMIN D3) 25 MCG (1000 UNIT) tablet, Take 1,000 Units by mouth daily., Disp: , Rfl:    EPINEPHrine (EPIPEN 2-PAK) 0.3 mg/0.3 mL IJ SOAJ injection,  Inject 0.3 mLs (0.3 mg total) into the muscle once as needed (for severe allergic reaction). CAll 911 immediately if you have to use this medicine (Patient not taking: No sig reported), Disp: 1 Device, Rfl: 1   ferrous sulfate 325 (65 FE) MG tablet, Take 325 mg by mouth daily with breakfast., Disp: , Rfl:    ibuprofen (ADVIL) 800 MG tablet, Take 1 tablet (800 mg total) by mouth every 8 (eight) hours as needed for moderate pain, cramping or mild pain., Disp: 20 tablet, Rfl: 0   misoprostol (CYTOTEC) 200 MCG tablet, Take 2 tablets (400 mcg total) by mouth once for 1 dose. Repeat in 48 hours if needed, Disp: 2 tablet, Rfl: 0   zinc sulfate 220 (50 Zn) MG capsule, Take 100 mg by mouth daily., Disp: , Rfl:   Observations/Objective: Patient is well-developed, well-nourished in no acute distress.  Resting comfortably at home.  Head is normocephalic, atraumatic.  No labored breathing.  Speech is clear and coherent with logical content.  Patient is alert and oriented at baseline.    Assessment and Plan: 1. Hemorrhoids, unspecified hemorrhoid type - hydrocortisone (ANUSOL-HC) 25 MG suppository; Place 1 suppository (25 mg total) rectally 2 (two) times daily.  Dispense: 12 suppository; Refill: 0 - hydrocortisone-pramoxine (PROCTOFOAM-HC) rectal foam; Place 1 applicator rectally 2 (two) times daily.  Dispense: 10 g; Refill: 0  - Proctofoam HC provided for patient - Anusol HC if proctoam does not resolve hemorrhoids - Increase water and fibrous foods in diet - May use stool softeners if needed - Seek in person evaluation if symptoms fail to improve or worsen.  Follow Up Instructions: I discussed the assessment and treatment plan with the patient. The patient was provided an opportunity to ask questions and all were answered. The patient agreed with the plan and demonstrated an understanding of the instructions.  A copy of instructions were sent to the patient via MyChart.  The patient was advised to  call back or seek an in-person evaluation if the symptoms worsen or if the condition fails to improve as anticipated.  Time:  I spent 12 minutes with the patient via telehealth technology discussing the above problems/concerns.    Mar Daring, PA-C

## 2021-08-01 ENCOUNTER — Ambulatory Visit
Admission: EM | Admit: 2021-08-01 | Discharge: 2021-08-01 | Disposition: A | Discharge status: Home / Self Care | Payer: 59 | Attending: Urgent Care | Admitting: Urgent Care

## 2021-08-01 ENCOUNTER — Other Ambulatory Visit: Payer: Self-pay

## 2021-08-01 DIAGNOSIS — H5712 Ocular pain, left eye: Secondary | ICD-10-CM

## 2021-08-01 DIAGNOSIS — H109 Unspecified conjunctivitis: Secondary | ICD-10-CM

## 2021-08-01 MED ORDER — TOBRAMYCIN 0.3 % OP SOLN: 2.0000 [drp] | OPHTHALMIC | 0 refills | Status: DC

## 2021-08-01 NOTE — ED Triage Notes (Signed)
Pt c/o left eye irritation onset yesterday. Denies pain. Unable to describe the sensation as burning, itchy, dull, achy, sharp, throbbing. States scant drainage from eye yesterday. Has tried eye drops at home without relief. Currently has colored contact in right eye. Left eye has clear redness/irritation specifically in the right region.

## 2021-08-01 NOTE — ED Provider Notes (Signed)
[place]   MRN: [id number] DOB: 1981-05-04  Subjective:   Ashley Casey is a 40 y.o. female presenting for 1 day history of acute onset left eye irritation, redness, clear watering.  Patient reports that the symptoms are pretty persistent.  She denies frank eye pain but just cannot get rid of the redness and discomfort.  She does wear contact lenses.  Denies fever, eye trauma, vision change, eyelid pain or swelling.  No current facility-administered medications for this encounter.  Current Outpatient Medications:    EPINEPHrine (EPIPEN 2-PAK) 0.3 mg/0.3 mL IJ SOAJ injection, Inject 0.3 mLs (0.3 mg total) into the muscle once as needed (for severe allergic reaction). CAll 911 immediately if you have to use this medicine (Patient not taking: No sig reported), Disp: 1 Device, Rfl: 1   Allergies  Allergen Reactions   Food Anaphylaxis and Other (See Comments)    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     Tegretol [Carbamazepine] Other (See Comments)    Reaction:  Seizures     Past Medical History:  Diagnosis Date   Abnormal Pap smear    Anxiety    in past   Bell's palsy    after 5th pregnancy never resolved   Cholelithiasis    Fatigue    Gestational diabetes    metformin   Gestational diabetes mellitus, antepartum    History of shingles    IBS (irritable bowel syndrome)    Neuromuscular disorder (HCC)      Past Surgical History:  Procedure Laterality Date   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     CHOLECYSTECTOMY  8/29/121   THERAPEUTIC ABORTION     WISDOM TOOTH EXTRACTION      Family History  Problem Relation Age of Onset   Other Mother        sickle cell anemia   Diabetes Mother    Sickle cell anemia Mother    Arthritis Brother    Asthma Brother    Diabetes Maternal Grandmother    Diabetes Paternal Grandmother     Social History   Tobacco Use   Smoking status: Never   Smokeless tobacco: Never  Substance Use Topics    Alcohol use: No   Drug use: No    ROS   Objective:   Vitals: BP 119/82 (BP Location: Left Arm)   Pulse 76   Temp 98.4 F (36.9 C) (Oral)   Resp 18   SpO2 99%   Breastfeeding No   Physical Exam Constitutional:      General: She is not in acute distress.    Appearance: Normal appearance. She is well-developed. She is not ill-appearing, toxic-appearing or diaphoretic.  HENT:     Head: Normocephalic and atraumatic.     Nose: Nose normal.     Mouth/Throat:     Mouth: Mucous membranes are moist.     Pharynx: Oropharynx is clear.  Eyes:     General: Lids are everted, no foreign bodies appreciated. No scleral icterus.       Right eye: No foreign body, discharge or hordeolum.        Left eye: No foreign body, discharge or hordeolum.     Extraocular Movements: Extraocular movements intact.     Right eye: Normal extraocular motion and no nystagmus.     Left eye: Normal extraocular motion and no nystagmus.     Conjunctiva/sclera:     Right eye: Right conjunctiva is not injected. No chemosis, exudate or  hemorrhage.    Left eye: Left conjunctiva is injected. No chemosis, exudate or hemorrhage.    Pupils: Pupils are equal, round, and reactive to light.  Cardiovascular:     Rate and Rhythm: Normal rate.  Pulmonary:     Effort: Pulmonary effort is normal.  Skin:    General: Skin is warm and dry.  Neurological:     General: No focal deficit present.     Mental Status: She is alert and oriented to person, place, and time.  Psychiatric:        Mood and Affect: Mood normal.        Behavior: Behavior normal.   Eye Exam: Eyelids everted and swept for foreign body. The eye was anesthetized with 2 drops of stained with fluorescein. Examination under woods lamp does not reveal a foreign body or area of increased stain uptake. The eye was then irrigated copiously with saline.  Assessment and Plan :   PDMP not reviewed this encounter.  1. Left eye pain     Start tobramycin for  coverage of bacterial conjunctivitis.  Use supportive care otherwise.  No contact lens wearing for the time being.  Counseled patient on potential for adverse effects with medications prescribed/recommended today, ER and return-to-clinic precautions discussed, patient verbalized understanding.    Jaynee Eagles, [place] 08/01/21 [phone]

## 2021-08-22 ENCOUNTER — Other Ambulatory Visit: Payer: Self-pay | Admitting: Women's Health

## 2022-01-04 ENCOUNTER — Other Ambulatory Visit: Payer: Self-pay | Admitting: Obstetrics and Gynecology

## 2022-01-04 DIAGNOSIS — R928 Other abnormal and inconclusive findings on diagnostic imaging of breast: Secondary | ICD-10-CM

## 2022-01-28 ENCOUNTER — Other Ambulatory Visit: Payer: Self-pay | Admitting: Obstetrics and Gynecology

## 2022-01-28 ENCOUNTER — Ambulatory Visit
Admission: RE | Admit: 2022-01-28 | Discharge: 2022-01-28 | Disposition: A | Discharge status: Home / Self Care | Payer: 59 | Source: Ambulatory Visit | Attending: Obstetrics and Gynecology | Admitting: Obstetrics and Gynecology

## 2022-01-28 DIAGNOSIS — R928 Other abnormal and inconclusive findings on diagnostic imaging of breast: Secondary | ICD-10-CM

## 2022-02-07 ENCOUNTER — Telehealth: Payer: 59 | Admitting: Nurse Practitioner

## 2022-02-07 DIAGNOSIS — B3731 Acute candidiasis of vulva and vagina: Secondary | ICD-10-CM | POA: Diagnosis not present

## 2022-02-07 MED ORDER — FLUCONAZOLE 150 MG PO TABS: ORAL_TABLET | ORAL | 0 refills | Status: DC

## 2022-02-07 NOTE — Progress Notes (Signed)
Virtual Visit Consent   Ashley Casey, you are scheduled for a virtual visit with Mary-Margaret Hassell Done, [place], a [facility] provider, today.     Just as with appointments in the office, your consent must be obtained to participate.  Your consent will be active for this visit and any virtual visit you may have with one of our providers in the next 365 days.     If you have a MyChart account, a copy of this consent can be sent to you electronically.  All virtual visits are billed to your insurance company just like a traditional visit in the office.    As this is a virtual visit, video technology does not allow for your provider to perform a traditional examination.  This may limit your provider's ability to fully assess your condition.  If your provider identifies any concerns that need to be evaluated in person or the need to arrange testing (such as labs, EKG, etc.), we will make arrangements to do so.     Although advances in technology are sophisticated, we cannot ensure that it will always work on either your end or our end.  If the connection with a video visit is poor, the visit may have to be switched to a telephone visit.  With either a video or telephone visit, we are not always able to ensure that we have a secure connection.     I need to obtain your verbal consent now.   Are you willing to proceed with your visit today? YES   Ashley Casey has provided verbal consent on 02/07/2022 for a virtual visit (video or telephone).   Mary-Margaret Hassell Done, FNP   Date: 02/07/2022 6:59 PM   Virtual Visit via Video Note   I, Mary-Margaret Hassell Done, connected with Ashley Casey, 1981/06/20) on 02/07/22 at  7:00 PM EST by a video-enabled telemedicine application and verified that I am speaking with the correct person using two identifiers.  Location: Patient: Virtual Visit Location Patient: Home Provider: Virtual Visit Location Provider: Mobile   I discussed the  limitations of evaluation and management by telemedicine and the availability of in person appointments. The patient expressed understanding and agreed to proceed.    History of Present Illness: Ashley Casey is a 41 y.o. who identifies as a female who was assigned female at birth, and is being seen today for yeast infection.  HPI: Patient was given antibiotic for tooth infection and now she has a vaginal discharge which is very itchy   Review of Systems  Constitutional: Negative.   Genitourinary: Negative.   All other systems reviewed and are negative.  Problems:  Patient Active Problem List   Diagnosis Date Noted   Prediabetes 06/12/2021   Left-sided Bell's palsy 06/12/2021   History of gestational diabetes 06/12/2021   Pregnancy 11/18/2014   Dependency on pain medication ([place]) 10/17/2013   Symptomatic cholelithiasis 07/28/2011    Allergies:  Allergies  Allergen Reactions   Food Anaphylaxis and Other (See Comments)    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     Tegretol [Carbamazepine] Other (See Comments)    Reaction:  Seizures    Medications:  Current Outpatient Medications:    EPINEPHrine (EPIPEN 2-PAK) 0.3 mg/0.3 mL IJ SOAJ injection, Inject 0.3 mLs (0.3 mg total) into the muscle once as needed (for severe allergic reaction). CAll 911 immediately if you have to use this medicine (Patient not taking: No sig reported), Disp: 1 Device,  Rfl: 1   tobramycin (TOBREX) 0.3 % ophthalmic solution, Place 2 drops into the left eye every 4 (four) hours., Disp: 5 mL, Rfl: 0  Observations/Objective: Patient is well-developed, well-nourished in no acute distress.  Resting comfortably  at home.  Head is normocephalic, atraumatic.  No labored breathing.  Speech is clear and coherent with logical content.  Patient is alert and oriented at baseline.    Assessment and Plan:  Ashley Casey in today with chief complaint of Vaginitis   1. Vaginal  candidiasis Avoid bubble baths Void after intercourse Meds ordered this encounter  Medications   fluconazole (DIFLUCAN) 150 MG tablet    Sig: 1 po q week x 4 weeks    Dispense:  4 tablet    Refill:  0    Order Specific Question:   Supervising Provider    Answer:   Noemi Chapel [[id number]]      Follow Up Instructions: I discussed the assessment and treatment plan with the patient. The patient was provided an opportunity to ask questions and all were answered. The patient agreed with the plan and demonstrated an understanding of the instructions.  A copy of instructions were sent to the patient via MyChart.  The patient was advised to call back or seek an in-person evaluation if the symptoms worsen or if the condition fails to improve as anticipated.  Time:  I spent 8 minutes with the patient via telehealth technology discussing the above problems/concerns.    Mary-Margaret Hassell Done, FNP

## 2022-02-07 NOTE — Patient Instructions (Signed)

## 2022-02-10 ENCOUNTER — Other Ambulatory Visit: Payer: 59

## 2022-02-11 ENCOUNTER — Ambulatory Visit
Admission: RE | Admit: 2022-02-11 | Discharge: 2022-02-11 | Disposition: A | Discharge status: Home / Self Care | Payer: 59 | Source: Ambulatory Visit | Attending: Obstetrics and Gynecology | Admitting: Obstetrics and Gynecology

## 2022-02-11 ENCOUNTER — Other Ambulatory Visit: Payer: Self-pay

## 2022-02-11 DIAGNOSIS — R928 Other abnormal and inconclusive findings on diagnostic imaging of breast: Secondary | ICD-10-CM

## 2022-03-09 ENCOUNTER — Ambulatory Visit (HOSPITAL_COMMUNITY)
Admission: EM | Admit: 2022-03-09 | Discharge: 2022-03-09 | Disposition: A | Discharge status: Home / Self Care | Payer: 59

## 2022-04-29 ENCOUNTER — Ambulatory Visit
Admission: EM | Admit: 2022-04-29 | Discharge: 2022-04-29 | Disposition: A | Discharge status: Home / Self Care | Payer: Managed Care, Other (non HMO) | Attending: Urgent Care | Admitting: Urgent Care

## 2022-04-29 DIAGNOSIS — M6208 Separation of muscle (nontraumatic), other site: Secondary | ICD-10-CM | POA: Diagnosis present

## 2022-04-29 DIAGNOSIS — N760 Acute vaginitis: Secondary | ICD-10-CM | POA: Diagnosis present

## 2022-04-29 DIAGNOSIS — R350 Frequency of micturition: Secondary | ICD-10-CM | POA: Insufficient documentation

## 2022-04-29 LAB — POCT URINALYSIS DIP (MANUAL ENTRY)
Bilirubin, UA: NEGATIVE
Blood, UA: NEGATIVE
Glucose, UA: NEGATIVE mg/dL
Ketones, POC UA: NEGATIVE mg/dL
Leukocytes, UA: NEGATIVE
Nitrite, UA: NEGATIVE
Protein Ur, POC: NEGATIVE mg/dL
Spec Grav, UA: 1.025 (ref 1.010–1.025)
Urobilinogen, UA: 0.2 E.U./dL
pH, UA: 6 (ref 5.0–8.0)

## 2022-04-29 LAB — POCT URINE PREGNANCY: Preg Test, Ur: NEGATIVE

## 2022-04-29 NOTE — ED Triage Notes (Signed)
Pt states urinary frequency and vaginal discharge for the past 4 days. ?

## 2022-04-29 NOTE — ED Provider Notes (Signed)
?[place] ? ? ? ?CSN: [id number] ?Arrival date & time: 04/29/22  1540 ? ? ?  ? ?History   ?Chief Complaint ?Chief Complaint  ?Patient presents with  ? Urinary Frequency  ? ? ?HPI ?Ashley Casey is a 41 y.o. female.  ? ?Pt presents with a 2-3 day hx of urinary frequency and vaginal discharge. 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 No odor or cloudy urine. About to start menses, reports hx of physiologic discharge around menses timeframe. No dysuria. No pelvic pain. Is not having normal BMs s/p cholecystectomy. Took laxative the other night with some improvement in symptoms. Does follow with specialist for weight loss, was dx with diastasis recti. Is now following new diet for weight loss. ? ? ?Urinary Frequency ? ? ?Past Medical History:  ?Diagnosis Date  ? Abnormal Pap smear   ? Anxiety   ? in past  ? Bell's palsy   ? after 5th pregnancy never resolved  ? Cholelithiasis   ? Fatigue   ? Gestational diabetes   ? metformin  ? Gestational diabetes mellitus, antepartum   ? History of shingles   ? IBS (irritable bowel syndrome)   ? Neuromuscular disorder ([place])   ? ? ?Patient Active Problem List  ? Diagnosis Date Noted  ? Prediabetes 06/12/2021  ? Left-sided Bell's palsy 06/12/2021  ? History of gestational diabetes 06/12/2021  ? Pregnancy 11/18/2014  ? Dependency on pain medication ([place]) 10/17/2013  ? Symptomatic cholelithiasis 07/28/2011  ? ? ?Past Surgical History:  ?Procedure Laterality Date  ? 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    ? CHOLECYSTECTOMY  8/29/121  ? THERAPEUTIC ABORTION    ? WISDOM TOOTH EXTRACTION    ? ? ?OB History   ? ? Gravida  ?12  ? Para  ?7  ? Term  ?7  ? Preterm  ?   ? AB  ?4  ? Living  ?7  ?  ? ? SAB  ?3  ? IAB  ?1  ? Ectopic  ?   ? Multiple  ?0  ? Live Births  ?7  ?   ?  ?  ? ? ? ?Home Medications   ? ?Prior to Admission medications   ?Medication Sig Start Date End Date Taking? Authorizing Provider  ?albuterol (VENTOLIN HFA) 108 (90 Base)  MCG/ACT inhaler Inhale 2 puffs into the lungs every 4 (four) hours as needed for wheezing or shortness of breath. 05/03/20 05/03/21  Faustino Congress, NP  ?clonazePAM (KLONOPIN) 0.5 MG tablet Take 1 tablet (0.5 mg total) by mouth 2 (two) times daily as needed for anxiety. 05/16/19 02/15/20  Ok Edwards, PA-C  ? ? ?Family History ?Family History  ?Problem Relation Age of Onset  ? Other Mother   ?     sickle cell anemia  ? Diabetes Mother   ? Sickle cell anemia Mother   ? Arthritis Brother   ? Asthma Brother   ? Diabetes Maternal Grandmother   ? Diabetes Paternal Grandmother   ? ? ?Social History ?Social History  ? ?Tobacco Use  ? Smoking status: Never  ? Smokeless tobacco: Never  ?Substance Use Topics  ? Alcohol use: No  ? Drug use: No  ? ? ? ?Allergies   ?Food and Tegretol [carbamazepine] ? ? ?Review of Systems ?Review of Systems  ?Genitourinary:  Positive for frequency.  ?AS PER HPI ? ?Physical Exam ?Triage Vital Signs ?ED Triage Vitals [04/29/22 1656]  ?Enc Vitals Group  ?  BP 98/68  ?   Pulse Rate 87  ?   Resp 16  ?   Temp 98.2 ?F (36.8 ?C)  ?   Temp Source Oral  ?   SpO2 97 %  ?   Weight   ?   Height   ?   Head Circumference   ?   Peak Flow   ?   Pain Score 0  ?   Pain Loc   ?   Pain Edu?   ?   Excl. in [place]?   ? ?No data found. ? ?Updated Vital Signs ?BP 98/68 (BP Location: Left Arm)   Pulse 87   Temp 98.2 ?F (36.8 ?C) (Oral)   Resp 16   LMP 03/27/2022 (Exact Date)   SpO2 97%  ? ?Visual Acuity ?Right Eye Distance:   ?Left Eye Distance:   ?Bilateral Distance:   ? ?Right Eye Near:   ?Left Eye Near:    ?Bilateral Near:    ? ?Physical Exam ?Vitals and nursing note reviewed.  ?Constitutional:   ?   General: She is not in acute distress. ?   Appearance: Normal appearance. She is well-developed. She is obese. She is not ill-appearing or toxic-appearing.  ?HENT:  ?   Head: Normocephalic and atraumatic.  ?Eyes:  ?   Conjunctiva/sclera: Conjunctivae normal.  ?Cardiovascular:  ?   Rate and Rhythm: Normal rate and regular  rhythm.  ?   Heart sounds: No murmur heard. ?Pulmonary:  ?   Effort: Pulmonary effort is normal. No respiratory distress.  ?   Breath sounds: Normal breath sounds.  ?Abdominal:  ?   General: Abdomen is flat. Bowel sounds are normal. There is no distension.  ?   Palpations: Abdomen is soft. There is no mass.  ?   Tenderness: There is no abdominal tenderness. There is no right CVA tenderness, left CVA tenderness, guarding or rebound.  ?   Hernia: No hernia is present.  ?   Comments: Midline diastasis  ?Musculoskeletal:     ?   General: No swelling.  ?   Cervical back: Neck supple.  ?   Right lower leg: No edema.  ?   Left lower leg: No edema.  ?Skin: ?   General: Skin is warm and dry.  ?   Capillary Refill: Capillary refill takes less than 2 seconds.  ?   Findings: No erythema or rash.  ?Neurological:  ?   General: No focal deficit present.  ?   Mental Status: She is alert and oriented to person, place, and time.  ?Psychiatric:     ?   Mood and Affect: Mood normal.  ? ? ? ?UC Treatments / Results  ?Labs ?(all labs ordered are listed, but only abnormal results are displayed) ?Labs Reviewed  ?URINE CULTURE  ?POCT URINALYSIS DIP (MANUAL ENTRY)  ?POCT URINE PREGNANCY  ?CERVICOVAGINAL ANCILLARY ONLY  ? ? ?EKG ? ? ?Radiology ?No results found. ? ?Procedures ?Procedures (including critical care time) ? ?Medications Ordered in UC ?Medications - No data to display ? ?Initial Impression / Assessment and Plan / UC Course  ?I have reviewed the triage vital signs and the nursing notes. ? ?Pertinent labs & imaging results that were available during my care of the patient were reviewed by me and considered in my medical decision making (see chart for details). ? ?  ? ?Urinary frequency - dipstick in office negative. Will send out culture. Decrease diuretics (sodas, coffee, tea, fruit juices, etoh), increase water. ?Vaginitis -  pt believes related to upcoming menses, but would like Aptima swab obtained. Await results ?Diastasis recti  - noted. Exercises to improve discussed ? ?Final Clinical Impressions(s) / UC Diagnoses  ? ?Final diagnoses:  ?Urinary frequency  ?Vaginitis and vulvovaginitis  ?Diastasis recti  ? ? ? ?Discharge Instructions   ? ?  ?Your urine dipstick is normal.  Your urine pregnancy is negative. ?We will send out your urine culture for confirmation. ?Your urinary frequency is likely related to your constipation. ?I would recommend picking up MiraLAX and mixing 17 g, or 1 scoop, into your favorite beverage once daily. ?We have also tested you for causes of vaginal discharge including gonorrhea, chlamydia, trichomonas, bacterial vaginosis, candidiasis. ?We will call with results once received. ?Look up abdominal exercises for diastasis recti; crunches and sit ups can make it worse. ?Follow up with your PCP ? ? ? ? ?ED Prescriptions   ?None ?  ? ?PDMP not reviewed this encounter. ?  Chaney Malling, [place] ?04/29/22 2054 ? ?

## 2022-04-29 NOTE — Discharge Instructions (Addendum)
Your urine dipstick is normal.  Your urine pregnancy is negative. ?We will send out your urine culture for confirmation. ?Your urinary frequency is likely related to your constipation. ?I would recommend picking up MiraLAX and mixing 17 g, or 1 scoop, into your favorite beverage once daily. ?We have also tested you for causes of vaginal discharge including gonorrhea, chlamydia, trichomonas, bacterial vaginosis, candidiasis. ?We will call with results once received. ?Look up abdominal exercises for diastasis recti; crunches and sit ups can make it worse. ?Follow up with your PCP ?

## 2022-04-30 LAB — CERVICOVAGINAL ANCILLARY ONLY
Bacterial Vaginitis (gardnerella): NEGATIVE
Candida Glabrata: NEGATIVE
Candida Vaginitis: POSITIVE — AB
Chlamydia: NEGATIVE
Comment: NEGATIVE
Comment: NEGATIVE
Comment: NEGATIVE
Comment: NEGATIVE
Comment: NEGATIVE
Comment: NORMAL
Neisseria Gonorrhea: NEGATIVE
Trichomonas: NEGATIVE

## 2022-05-01 ENCOUNTER — Telehealth (HOSPITAL_COMMUNITY): Payer: Self-pay | Admitting: Emergency Medicine

## 2022-05-01 LAB — URINE CULTURE: Culture: <10000 — AB

## 2022-05-01 MED ORDER — FLUCONAZOLE 150 MG PO TABS: 150.0000 mg | ORAL_TABLET | Freq: Once | ORAL | 0 refills | Status: AC

## 2022-06-19 ENCOUNTER — Other Ambulatory Visit: Payer: Self-pay

## 2022-06-19 ENCOUNTER — Emergency Department (HOSPITAL_COMMUNITY)
Admission: EM | Admit: 2022-06-19 | Discharge: 2022-06-19 | Disposition: A | Discharge status: Home / Self Care | Payer: 59 | Attending: Emergency Medicine | Admitting: Emergency Medicine

## 2022-06-19 ENCOUNTER — Ambulatory Visit: Payer: Self-pay

## 2022-06-19 ENCOUNTER — Ambulatory Visit
Admission: EM | Admit: 2022-06-19 | Discharge: 2022-06-19 | Disposition: A | Discharge status: Home / Self Care | Payer: 59 | Attending: Emergency Medicine | Admitting: Emergency Medicine

## 2022-06-19 DIAGNOSIS — N766 Ulceration of vulva: Secondary | ICD-10-CM | POA: Diagnosis not present

## 2022-06-19 DIAGNOSIS — N9089 Other specified noninflammatory disorders of vulva and perineum: Secondary | ICD-10-CM

## 2022-06-19 DIAGNOSIS — N7689 Other specified inflammation of vagina and vulva: Secondary | ICD-10-CM | POA: Diagnosis not present

## 2022-06-19 DIAGNOSIS — F419 Anxiety disorder, unspecified: Secondary | ICD-10-CM | POA: Diagnosis not present

## 2022-06-19 DIAGNOSIS — R102 Pelvic and perineal pain: Secondary | ICD-10-CM | POA: Diagnosis present

## 2022-06-19 LAB — URINALYSIS, ROUTINE W REFLEX MICROSCOPIC
Bilirubin Urine: NEGATIVE
Glucose, UA: NEGATIVE mg/dL
Hgb urine dipstick: NEGATIVE
Ketones, ur: NEGATIVE mg/dL
Nitrite: NEGATIVE
Protein, ur: NEGATIVE mg/dL
Specific Gravity, Urine: 1.023 (ref 1.005–1.030)
pH: 7 (ref 5.0–8.0)

## 2022-06-19 LAB — WET PREP, GENITAL
Clue Cells Wet Prep HPF POC: NONE SEEN
Sperm: NONE SEEN
Trich, Wet Prep: NONE SEEN
WBC, Wet Prep HPF POC: <10 (ref <10)
Yeast Wet Prep HPF POC: NONE SEEN

## 2022-06-19 LAB — PREGNANCY, URINE: Preg Test, Ur: NEGATIVE

## 2022-06-19 MED ORDER — IBUPROFEN 600 MG PO TABS: 600.0000 mg | ORAL_TABLET | Freq: Four times a day (QID) | ORAL | 0 refills | Status: DC | PRN

## 2022-06-19 MED ORDER — HYDROCODONE-ACETAMINOPHEN 5-325 MG PO TABS: 1.0000 | ORAL_TABLET | Freq: Four times a day (QID) | ORAL | 0 refills | Status: DC | PRN

## 2022-06-19 NOTE — Discharge Instructions (Addendum)
You can try Desitin or zinc barrier cream.  Sitz bath's to keep things clean, when you urinate, and for symptomatic relief..  600 mg of ibuprofen combined with a Tylenol containing product 3-4 times a day as needed for pain.  Either ibuprofen with 1000 mg of Tylenol for mild to moderate pain or ibuprofen with 1-2 Norco for severe pain.  Please call your OB/GYN tomorrow.

## 2022-06-19 NOTE — ED Provider Notes (Signed)
[facility] DEPT Provider Note   CSN: [id number] Arrival date & time: 06/19/22  1644     History {Add pertinent medical, surgical, social history, OB history to HPI:1} Chief Complaint  Patient presents with  . Vaginal Pain    Ashley Casey is a 41 y.o. female presenting today with worsening labial irritation.  Was treated 1 month ago for a vaginal yeast infection, noticed irritation continued.  Was retested on 05/2011 2023, and found to have bacterial vaginosis and treated for it.  Tested negative for gonorrhea, chlamydia, HSV, trichomonas.  Still with increased sensitivity of the area.  Per patient, OB/GYN prescribed triamcinolone topical.  Patient applied to the area, noticed burning within 2 to 3 seconds and immediately removed it.  Noticed some relief with aloe vera.  Pain aggravated with motion of the skin.  Denies fever, vaginal pain or discharge, suprapubic pain, N/V.  Recently used a new skin softening detergent on her close.  The history is provided by the patient and medical records.  Vaginal Pain      Home Medications Prior to Admission medications   Medication Sig Start Date End Date Taking? Authorizing Provider  HYDROcodone-acetaminophen (NORCO/VICODIN) 5-325 MG tablet Take 1 tablet by mouth every 6 (six) hours as needed for up to 8 doses for moderate pain or severe pain. 0/93/81   Prince Rome, PA-C  ibuprofen (ADVIL) 600 MG tablet Take 1 tablet (600 mg total) by mouth every 6 (six) hours as needed. 06/19/22   Melynda Ripple, MD  albuterol (VENTOLIN HFA) 108 (90 Base) MCG/ACT inhaler Inhale 2 puffs into the lungs every 4 (four) hours as needed for wheezing or shortness of breath. 05/03/20 05/03/21  Faustino Congress, NP  clonazePAM (KLONOPIN) 0.5 MG tablet Take 1 tablet (0.5 mg total) by mouth 2 (two) times daily as needed for anxiety. 05/16/19 02/15/20  Ok Edwards, PA-C      Allergies    Food and Tegretol [carbamazepine]     Review of Systems   Review of Systems  Genitourinary:  Positive for vaginal pain.    Physical Exam Updated Vital Signs BP (!) 114/94 (BP Location: Left Arm)   Pulse 74   Temp 98.3 F (36.8 C) (Oral)   Resp 18   Ht '5\' 3"'$  (1.6 m)   Wt 80.3 kg   LMP 06/10/2022   SpO2 100%   BMI 31.35 kg/m  Physical Exam Vitals and nursing note reviewed. Exam conducted with a chaperone present.  Constitutional:      General: She is not in acute distress.    Appearance: She is well-developed. She is not ill-appearing, toxic-appearing or diaphoretic.     Comments: Appears uncomfortable, tearful  HENT:     Head: Normocephalic and atraumatic.  Eyes:     Conjunctiva/sclera: Conjunctivae normal.  Cardiovascular:     Rate and Rhythm: Normal rate and regular rhythm.     Heart sounds: No murmur heard. Pulmonary:     Effort: Pulmonary effort is normal. No respiratory distress.     Breath sounds: Normal breath sounds.  Abdominal:     Palpations: Abdomen is soft.     Tenderness: There is no abdominal tenderness.  Genitourinary:    Exam position: Lithotomy position.     Comments: Patient with notable tenderness of the labial folds.  Mild erythema and very scant white creamy discharge appreciated in the surrounding area.  Without evidence of ulceration, cellulitis, mass, or appreciated lesions.  No vaginal tenderness.  Patient defers  speculum exam and bimanual exam, states she is unable to tolerate due to labial tenderness and recently had this done under OB/GYN. Musculoskeletal:        General: No swelling.     Cervical back: Neck supple.  Skin:    General: Skin is warm and dry.     Capillary Refill: Capillary refill takes less than 2 seconds.  Neurological:     Mental Status: She is alert and oriented to person, place, and time.  Psychiatric:        Mood and Affect: Mood is anxious. Affect is tearful.    ED Results / Procedures / Treatments   Labs (all labs ordered are listed, but only  abnormal results are displayed) Labs Reviewed  URINALYSIS, ROUTINE W REFLEX MICROSCOPIC - Abnormal; Notable for the following components:      Result Value   APPearance HAZY (*)    Leukocytes,Ua TRACE (*)    Bacteria, UA RARE (*)    All other components within normal limits  WET PREP, GENITAL  AEROBIC CULTURE W GRAM STAIN (SUPERFICIAL SPECIMEN)  PREGNANCY, URINE  GC/CHLAMYDIA PROBE AMP ([place]) NOT AT [facility]    EKG None  Radiology No results found.  Procedures Procedures  {Document cardiac monitor, telemetry assessment procedure when appropriate:1}  Medications Ordered in ED Medications - No data to display  ED Course/ Medical Decision Making/ A&P                           Medical Decision Making Amount and/or Complexity of Data Reviewed External Data Reviewed: notes. Labs: ordered. Decision-making details documented in ED Course. Radiology: ordered and independent interpretation performed. Decision-making details documented in ED Course. ECG/medicine tests: ordered and independent interpretation performed. Decision-making details documented in ED Course.  Risk OTC drugs. Prescription drug management.   41 y.o. female presents to the ED for concern of Vaginal Pain   This involves an extensive number of treatment options, and is a complaint that carries with it a high risk of complications and morbidity.  The emergent differential diagnosis prior to evaluation includes, but is not limited to: Cellulitis, dermatitis, vulvovaginitis, Bartholin cyst  This is not an exhaustive differential.   Past Medical History / Co-morbidities / Social History: Hx of recent candidal dermatitis of labial folds and bacterial vaginosis  Additional History:  Internal and external records from outside source obtained and reviewed including Urgent Care  Lab Tests: I ordered, and personally interpreted labs.  The pertinent results include:   Urine pregnancy: Negative Wet prep:  Negative Urinalysis: Unremarkable Aerobic culture with Gram stain: Pending  Imaging Studies: I ordered imaging studies including *** .   I independently visualized and interpreted imaging which showed *** I agree with the radiologist interpretation.  ED Course: Pt well-appearing on exam.  ***.    Patient in NAD and in good condition at time of discharge.  Disposition: After consideration the patient's encounter today, I do not feel today's workup suggests an emergent condition requiring admission or immediate intervention beyond what has been performed at this time.  Safe for discharge; instructed to return immediately for worsening symptoms, change in symptoms or any other concerns.  I have reviewed the patients home medicines and have made adjustments as needed.  Discussed course of treatment with the patient, whom demonstrated understanding.  Patient in agreement and has no further questions.    I discussed this case with my attending physician Dr. Johnney Killian, who agreed with  the proposed treatment course and cosigned this note including patient's presenting symptoms, physical exam, and planned diagnostics and interventions.  Attending physician stated agreement with plan or made changes to plan which were implemented.     This chart was dictated using voice recognition software.  Despite best efforts to proofread, errors can occur which can change the documentation meaning.   {Document critical care time when appropriate:1} {Document review of labs and clinical decision tools ie heart score, Chads2Vasc2 etc:1}  {Document your independent review of radiology images, and any outside records:1} {Document your discussion with family members, caretakers, and with consultants:1} {Document social determinants of health affecting pt's care:1} {Document your decision making why or why not admission, treatments were needed:1} Final Clinical Impression(s) / ED Diagnoses Final diagnoses:  Labia  irritation    Rx / DC Orders ED Discharge Orders          Ordered    HYDROcodone-acetaminophen (NORCO/VICODIN) 5-325 MG tablet  Every 6 hours PRN       Note to Pharmacy: Previous prescription from previous provider sent to incorrect pharmacy.  Fill THIS prescription only.   06/19/22 2013

## 2022-06-19 NOTE — ED Triage Notes (Signed)
Pt presents with vaginal irritation X 1 week that is very painful; pt states she was recently treated for BV and tested for STDs.

## 2022-06-19 NOTE — Discharge Instructions (Addendum)
Your test results today were negative for bacterial vaginosis and yeast infection.  Your gonorrhea and Chlamydia test results are pending, as is your culture of the area.  Continue to utilize aloe vera and/or coconut oil, you may chill this by placing it in the fridge, then applying it to the area.  A short course of pain medication has been sent to a 24-hour pharmacy listed on this discharge paperwork.  Do not drive or operate machinery after taking this medication.  Only use as indicated.  Follow-up with your OB/GYN or PCP soon as possible for reevaluation and continued medical management.  Refrain from vigorous activities, keep the area rest, and let it breathe.  Return to the ED for new or worsening symptoms as discussed.   If you do not have a doctor see the list below.  RESOURCE GUIDE  Chronic Pain Problems: Contact [facility]  [phone] Patients need to be referred by their primary care doctor.  Insufficient Money for Medicine: Contact [employer]:  call "211" or [place] [phone].  No Primary Care Doctor: Call [facility]  [phone] - can help you locate a primary care doctor that  accepts your insurance, provides certain services, etc. Physician Referral Service- [phone] Agencies that provide inexpensive medical care: [facility]  [facility]  [phone] [facility]  [phone] [facility]  [phone] Planned [facility]  [phone] [facility]  [phone]  [place] Providers: [facility]- [address], [address]  [phone], Mon-Fri 9am-7pm, Sat 9am-1pm [place], Suite [place], [address] [place]  [place]- [address]  [place], [address], [phone]  Only  accepts [place] [facility] [place] patients after they have their name  applied to their card  Self Pay (no insurance) in [facility]: Sickle Cell Patients: Dr Kevan Ny, [facility] Internal Medicine  [place], [facility]- [place]  [facility] [place]- [address], [facility]- see information above (Speak to [employer] if you do not have insurance)       -  [facility]- [place], [place],  [place], [place]  Dr Vista Lawman-  [address], [place], [place] [facility]- [address], [phone]       -  [facility]- [address] [place], [place], also [address], [phone]       -    [facility]- [address], [place], 1st & 3rd Saturday   every month, 10am-1pm  1) Find a Doctor and Pay Out of Pocket Although you won't have to find out who is covered by your insurance plan, it is a good idea to ask around and get recommendations. You will then need to call the office and see if the doctor  you have chosen will accept you as a new patient and what types of options they offer for patients who are self-pay. Some doctors offer discounts or will set up payment plans for their patients who do not have insurance, but you will need to ask so you aren't surprised when you get to your appointment.  2) Contact Your Local Health Department Not all health departments have doctors that can see patients for sick visits, but many do, so it is worth a call to see if yours does. If you don't know where your local health department is, you can check in your phone book. The CDC also has a tool to help you locate your state's health department, and many state websites also have listings of all of  their local health departments.  3) Find a [facility] If your illness is not likely to be very severe or complicated, you may want to try a walk in clinic. These are popping up all over the country in pharmacies, drugstores, and shopping centers. They're usually staffed by nurse practitioners or physician assistants that have been trained to treat common illnesses and complaints. They're usually fairly quick and inexpensive. However, if you have serious medical issues or chronic medical problems, these are probably not your best option  STD [place], [facility], [address], [place], phone [phone] or [phone].  Monday - Friday, call for an appointment. [place], [facility], [address], [place], phone [phone] or [phone].  Monday - Friday, call for an appointment.  Abuse/Neglect: [place] [phone] [place] [phone] (After Hours)  Emergency Shelter:  [facility] [phone]  Maternity Homes: Room at the [place] [phone] [place] [phone]  MRSA Hotline #:   (361)642-8319  [facility] Dept. [address]      [place] Phone:  [phone]                                   Phone:  [phone]                 Phone:  [place], [place] in [place], [address]  [phone],  [place] [phone] or [phone] (After Hours)  [place]  Substance Abuse Resources: Alcohol and Drug Services  [place] [phone] [facility] [place] [facility] Chinita Pester 6782158147 Residential & Outpatient Substance Abuse Program  [phone]  Psychological Services: [place]  [phone] [place]  [place], [place] [address] [place], [place]: [phone] or [phone], [employer]  Dental Assistance  Patients with Medicaid: [place] [phone] [address] Phone:  [phone]                                                  Phone:  [phone]  If unable to pay or uninsured, contact:  [facility] or [facility]. to become qualified for the adult dental clinic.  Patients with Medicaid: [facility] [phone] [address], [place] [address], [phone]  If unable to pay, or uninsured, contact [facility] [phone]) or [place] [phone] in [place], [place] in [facility]) to become qualified for the adult dental clinic  Other [place]- [place], [place], [place], [address], [place], [place], 2nd and 4th Thursday of the month at 6:30am.  10 clients each day by appointment, can sometimes see walk-in patients if someone does not show for an appointment. [facility]- [address]er [place], [place], [address], [place], [place], [place], [address], [place] Department- [phone] [place] [facility] [phone]

## 2022-06-19 NOTE — Telephone Encounter (Addendum)
  Chief Complaint: vaginal bumps Symptoms: vaginal blisters very painful 8/10, burning, makes walking painful Frequency: ongoing since 19th or 20th Pertinent Negatives: Patient denies discharge Disposition: '[x]'$ ED /'[]'$ Urgent Care (no appt availability in office) / '[]'$ Appointment(In office/virtual)/ '[]'$  [place] [facility]/ '[]'$ Home Care/ '[]'$ Refused Recommended Disposition /'[]'$ [place] Mobile Bus/ '[]'$  Follow-up with PCP Additional Notes: pt has been in UC and they advised her to see OBGYN tomorrow. She states she wont be able to get appt with OBGYN tomorrow and didn't want to see the provider she seen on 06/10/22 d/t her giving triamcinolone cream to put on vaginal area and caused it to be on fire. Pt said they were unsure what is going on with blisters. Pt is asking if she should go to ED since no one knows how to help her and she is hurting. I advised her she can go to ED based on symptoms. Pt said she will be going to [facility]  Reason for Disposition  [1] SEVERE pain AND [2] not improved 2 hours after pain medicine  Answer Assessment - Initial Assessment Questions 1. SYMPTOM: "What's the main symptom you're concerned about?" (e.g., pain, itching, dryness)     Painful blisters  2. LOCATION: "Where is the  sx located?" (e.g., inside/outside, left/right)     Vaginal outside 3. ONSET: "When did the  sx  start?"     19, 20 4. PAIN: "Is there any pain?" If Yes, ask: "How bad is it?" (Scale: 1-10; mild, moderate, severe)     8 5. ITCHING: "Is there any itching?" If Yes, ask: "How bad is it?" (Scale: 1-10; mild, moderate, severe)     Not today or yesterday 7. OTHER SYMPTOMS: "Do you have any other symptoms?" (e.g., fever, itching, vaginal bleeding, pain with urination, injury to genital area, vaginal foreign body)  Protocols used: Vaginal Symptoms-A-AH

## 2022-06-19 NOTE — ED Provider Notes (Signed)
HPI  SUBJECTIVE:  Ashley Casey is a 41 y.o. female who presents with over 1 week of worsening vaginal burning, itching, "raw patches" around her clitoral hood.  She was evaluated by her OB/GYN for this, and states that HSV, STD testing were negative.  She was found to have BV and is currently on Flagyl.  She denies other rash, discharge, blisters, vaginal odor, abdominal, back, pelvic pain, fevers, oral ulcers.  She reports dysuria, but has no other urinary complaints.  She is sexually active with her husband, who is asymptomatic.  STDs are not a concern today.  They do not use condoms, or require lubrication.  No known exposure to latex.  No recent antibiotics.  No change in her medications, new lotions or soaps.  She is using scent beads in her laundry detergent.  She has tried triamcinolone, triple antibiotic ointment, Neosporin, Aquaphor, Vaseline, and 2 tabs of Diflucan.  Vaseline helps, triamcinolone, exposure to water and urination makes her symptoms worse.  Past medical history of BV, yeast.  No history of Bechet syndrome, syphilis, diabetes, gonorrhea, chlamydia, trichomonas, HSV.   Past Medical History:  Diagnosis Date   Abnormal Pap smear    Anxiety    in past   Bell's palsy    after 5th pregnancy never resolved   Cholelithiasis    Fatigue    Gestational diabetes    metformin   Gestational diabetes mellitus, antepartum    History of shingles    IBS (irritable bowel syndrome)    Neuromuscular disorder (HCC)     Past Surgical History:  Procedure Laterality Date   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     CHOLECYSTECTOMY  8/29/121   THERAPEUTIC ABORTION     WISDOM TOOTH EXTRACTION      Family History  Problem Relation Age of Onset   Other Mother        sickle cell anemia   Diabetes Mother    Sickle cell anemia Mother    Arthritis Brother    Asthma Brother    Diabetes Maternal Grandmother    Diabetes Paternal Grandmother     Social History   Tobacco Use    Smoking status: Never   Smokeless tobacco: Never  Substance Use Topics   Alcohol use: No   Drug use: No    No current facility-administered medications for this encounter.  Current Outpatient Medications:    HYDROcodone-acetaminophen (NORCO/VICODIN) 5-325 MG tablet, Take 1-2 tablets by mouth every 6 (six) hours as needed for moderate pain or severe pain., Disp: 12 tablet, Rfl: 0   ibuprofen (ADVIL) 600 MG tablet, Take 1 tablet (600 mg total) by mouth every 6 (six) hours as needed., Disp: 30 tablet, Rfl: 0  Allergies  Allergen Reactions   Food Anaphylaxis and Other (See Comments)    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     Tegretol [Carbamazepine] Other (See Comments)    Reaction:  Seizures      ROS  As noted in HPI.   Physical Exam  BP 107/76 (BP Location: Left Arm)   Pulse 82   Temp 97.9 F (36.6 C) (Oral)   Resp 17   LMP  (LMP Unknown)   SpO2 98%   Constitutional: Well developed, well nourished, no acute distress Eyes:  EOMI, conjunctiva normal bilaterally HENT: Normocephalic, atraumatic,mucus membranes moist Respiratory: Normal inspiratory effort Cardiovascular: Normal rate GI: nondistended soft, nontender. No suprapubic tenderness  back: No CVA tenderness GU: Multiple shallow painful ulcers along  the clitoral hood and along the junction of the inner and external labia.  There are no lesions along the introitus.  Inner labia appear normal.  Positive discharge.  Chaperone present during exam Lymph: No inguinal lymphadenopathy skin: No rash, skin intact Musculoskeletal: no deformities Neurologic: Alert & oriented x 3, no focal neuro deficits Psychiatric: Speech and behavior appropriate   ED Course   Medications - No data to display  No orders of the defined types were placed in this encounter.   No results found for this or any previous visit (from the past 24 hour(s)). No results found.  ED Clinical Impression  1. Genital labial  ulcer     ED Assessment/Plan  Unable to view outside records.  Narcotic database reviewed.  Last opiate prescription in February 23.  Feel it is appropriate to proceed with a controlled substance prescription at this time.  Unsure as to the etiology of her symptoms.  Could be Bechet syndrome.  I do not think it is Stevens-Johnson syndrome.  She recently had negative herpes testing, so this is ruled out.  Advised sitz bath's, will send home with Tylenol containing product and ibuprofen.  Either ibuprofen/Tylenol for mild to moderate pain or ibuprofen/Norco for severe pain.  Zinc paste or Desitin barrier cream.  Patient declined HIV, syphilis testing.  I do not think this is syphilis because she has multiple painful ulcers.  Work note.  She is to follow-up with her OB/GYN tomorrow.  Meds ordered this encounter  Medications   HYDROcodone-acetaminophen (NORCO/VICODIN) 5-325 MG tablet    Sig: Take 1-2 tablets by mouth every 6 (six) hours as needed for moderate pain or severe pain.    Dispense:  12 tablet    Refill:  0   ibuprofen (ADVIL) 600 MG tablet    Sig: Take 1 tablet (600 mg total) by mouth every 6 (six) hours as needed.    Dispense:  30 tablet    Refill:  0    *This clinic note was created using L[employer]ist. Therefore, there may be occasional mistakes despite careful proofreading.  ?     Melynda Ripple, MD 06/20/22 [phone]

## 2022-06-19 NOTE — ED Triage Notes (Signed)
Patient c/o vaginal pain, redness and irritation x 1 week. States she went to [facility] and then was sent here. Reports going to her gyno doc on 19th and had a lesion that they swabbed. Was diagnosed with BV and completed round of antibiotics 2 days ago.

## 2022-06-20 LAB — GC/CHLAMYDIA PROBE AMP (~~LOC~~) NOT AT ARMC
Chlamydia: NEGATIVE
Comment: NEGATIVE
Comment: NORMAL
Neisseria Gonorrhea: NEGATIVE

## 2022-06-22 LAB — AEROBIC CULTURE W GRAM STAIN (SUPERFICIAL SPECIMEN)
Culture: NORMAL
Gram Stain: NONE SEEN

## 2022-06-23 ENCOUNTER — Telehealth: Payer: Self-pay | Admitting: Emergency Medicine

## 2022-06-23 NOTE — Telephone Encounter (Signed)
Post ED Visit - Positive Culture Follow-up  Culture report reviewed by antimicrobial stewardship pharmacist: [place] Team '[]'$  Elenor Quinones, Pharm.D. '[]'$  Heide Guile, Pharm.D., BCPS AQ-ID '[]'$  Parks Neptune, Pharm.D., BCPS '[x]'$  Alycia Rossetti, Pharm.D., BCPS '[]'$  [place], Pharm.D., BCPS, AAHIVP '[]'$  Legrand Como, Pharm.D., BCPS, AAHIVP '[]'$  Salome Arnt, PharmD, BCPS '[]'$  Johnnette Gourd, PharmD, BCPS '[]'$  Hughes Better, PharmD, BCPS '[]'$  Leeroy Cha, PharmD '[]'$  Laqueta Linden, PharmD, BCPS '[]'$  Albertina Parr, PharmD  [place] Team '[]'$  Leodis Sias, PharmD '[]'$  Lindell Spar, PharmD '[]'$  Royetta Asal, PharmD '[]'$  Graylin Shiver, Rph '[]'$  Rema Fendt) Glennon Mac, PharmD '[]'$  Arlyn Dunning, PharmD '[]'$  Netta Cedars, PharmD '[]'$  Dia Sitter, PharmD '[]'$  Leone Haven, PharmD '[]'$  Gretta Arab, PharmD '[]'$  Theodis Shove, PharmD '[]'$  Peggyann Juba, PharmD '[]'$  Reuel Boom, PharmD   Aerobic culture Treated with none, normal vaginal flora and no further patient follow-up is required at this time.  Hazle Nordmann 06/23/2022, 9:48 AM

## 2022-06-30 NOTE — Progress Notes (Signed)
Subjective:    KAIYLA STAHLY - 41 y.o. female MRN [id number]  Date of birth: [birth year]  HPI  Cedric A Bonk is to establish care.   Current issues and/or concerns: Reports persisting labia majora/minora irritation. However, has improved since initial occurrence. Triamcinolone previously prescribed made worse and self-discontinued. Doesn't feel labia irritation is from internal/vaginal issues. Declines urine screening and vaginal self-swab today. Established with [name], MD at [facility]. Needs new OBGYN referral to office accepting Medicaid. Plans to return at later date for annual physical exam. No further issues/concerns.    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 She has never been exposed to tobacco smoke. She has never used smokeless tobacco. She reports that she does not drink alcohol and does not use drugs.   Family History  family history includes Arthritis in her brother; Asthma in her brother; Diabetes in her maternal grandmother, mother, and paternal grandmother; Other in her mother; Sickle cell anemia in her mother.   Medications: reviewed and updated   Objective:   Physical Exam BP 107/75 (BP Location: Left Arm, Patient Position: Sitting, Cuff Size: Large)   Pulse 86   Temp 98.3 F (36.8 C)   Resp 16   Ht 5' 4.17" (1.63 m)   Wt 182 lb (82.6 kg)   LMP 06/10/2022   SpO2 96%   BMI 31.07 kg/m   Physical Exam HENT:     Head:  Normocephalic and atraumatic.  Eyes:     Extraocular Movements: Extraocular movements intact.     Conjunctiva/sclera: Conjunctivae normal.     Pupils: Pupils are equal, round, and reactive to light.  Cardiovascular:     Rate and Rhythm: Normal rate and regular rhythm.     Pulses: Normal pulses.     Heart sounds: Normal heart sounds.  Pulmonary:     Effort: Pulmonary effort is normal.     Breath sounds: Normal breath sounds.  Musculoskeletal:     Cervical back: Normal range of motion and neck supple.  Neurological:     General: No focal deficit present.     Mental Status: She is alert and oriented to person, place, and time.  Psychiatric:        Mood and Affect: Mood normal.        Behavior: Behavior normal.       Assessment & Plan:  1. Encounter to establish care - Patient presents today to establish care.  - Return for annual physical examination, labs, and health maintenance. Arrive fasting meaning having no food for at least 8 hours prior to appointment. You may have only water or black coffee. Please take scheduled medications as normal.  2. Labia irritation - Referral to Gynecology for further evaluation and management.  - Ambulatory referral to Gynecology     Patient was given clear instructions to go to Emergency Department or return to medical center if symptoms don't improve, worsen, or new problems  develop.The patient verbalized understanding.  I discussed the assessment and treatment plan with the patient. The patient was provided an opportunity to ask questions and all were answered. The patient agreed with the plan and demonstrated an understanding of the instructions.   The patient was advised to call back or seek an in-person evaluation if the symptoms worsen or if the condition fails to improve as anticipated.    Durene Fruits, NP 07/07/2022, 1:37 PM Primary Care at [facility]

## 2022-07-07 ENCOUNTER — Ambulatory Visit (INDEPENDENT_AMBULATORY_CARE_PROVIDER_SITE_OTHER): Payer: 59 | Admitting: Family

## 2022-07-07 ENCOUNTER — Encounter: Payer: Self-pay | Admitting: Family

## 2022-07-07 VITALS — BP 107/75 | HR 86 | Temp 98.3°F | Resp 16 | Ht 64.17 in | Wt 182.0 lb

## 2022-07-07 DIAGNOSIS — Z7689 Persons encountering health services in other specified circumstances: Secondary | ICD-10-CM

## 2022-07-07 DIAGNOSIS — N9089 Other specified noninflammatory disorders of vulva and perineum: Secondary | ICD-10-CM

## 2022-07-07 NOTE — Progress Notes (Signed)
.  Pt presents to establish care, states that gyn prescribed triamcinolone ointment to put on vagina for rash and she has been irritated every since

## 2022-07-29 ENCOUNTER — Emergency Department (HOSPITAL_BASED_OUTPATIENT_CLINIC_OR_DEPARTMENT_OTHER)
Admission: EM | Admit: 2022-07-29 | Discharge: 2022-07-29 | Disposition: A | Discharge status: Home / Self Care | Payer: 59 | Attending: Emergency Medicine | Admitting: Emergency Medicine

## 2022-07-29 ENCOUNTER — Emergency Department (HOSPITAL_BASED_OUTPATIENT_CLINIC_OR_DEPARTMENT_OTHER): Payer: 59

## 2022-07-29 ENCOUNTER — Other Ambulatory Visit: Payer: Self-pay

## 2022-07-29 ENCOUNTER — Encounter (HOSPITAL_BASED_OUTPATIENT_CLINIC_OR_DEPARTMENT_OTHER): Payer: Self-pay | Admitting: Emergency Medicine

## 2022-07-29 DIAGNOSIS — R079 Chest pain, unspecified: Secondary | ICD-10-CM | POA: Insufficient documentation

## 2022-07-29 DIAGNOSIS — R7989 Other specified abnormal findings of blood chemistry: Secondary | ICD-10-CM | POA: Diagnosis not present

## 2022-07-29 LAB — CBC WITH DIFFERENTIAL/PLATELET
Abs Immature Granulocytes: 0.01 10*3/uL (ref 0.00–0.07)
Basophils Absolute: 0 10*3/uL (ref 0.0–0.1)
Basophils Relative: 0 %
Eosinophils Absolute: 0 10*3/uL (ref 0.0–0.5)
Eosinophils Relative: 1 %
HCT: 32.8 % — ABNORMAL LOW (ref 36.0–46.0)
Hemoglobin: 10 g/dL — ABNORMAL LOW (ref 12.0–15.0)
Immature Granulocytes: 0 %
Lymphocytes Relative: 22 %
Lymphs Abs: 1.4 10*3/uL (ref 0.7–4.0)
MCH: 23.5 pg — ABNORMAL LOW (ref 26.0–34.0)
MCHC: 30.5 g/dL (ref 30.0–36.0)
MCV: 77.2 fL — ABNORMAL LOW (ref 80.0–100.0)
Monocytes Absolute: 0.6 10*3/uL (ref 0.1–1.0)
Monocytes Relative: 9 %
Neutro Abs: 4.3 10*3/uL (ref 1.7–7.7)
Neutrophils Relative %: 68 %
Platelets: 248 10*3/uL (ref 150–400)
RBC: 4.25 MIL/uL (ref 3.87–5.11)
RDW: 14.8 % (ref 11.5–15.5)
WBC: 6.3 10*3/uL (ref 4.0–10.5)
nRBC: 0 % (ref 0.0–0.2)

## 2022-07-29 LAB — D-DIMER, QUANTITATIVE: D-Dimer, Quant: 1.9 ug/mL-FEU — ABNORMAL HIGH (ref 0.00–0.50)

## 2022-07-29 LAB — BASIC METABOLIC PANEL
Anion gap: 6 (ref 5–15)
BUN: 13 mg/dL (ref 6–20)
CO2: 26 mmol/L (ref 22–32)
Calcium: 8.4 mg/dL — ABNORMAL LOW (ref 8.9–10.3)
Chloride: 103 mmol/L (ref 98–111)
Creatinine, Ser: 0.76 mg/dL (ref 0.44–1.00)
GFR, Estimated: >60 mL/min (ref >60)
Glucose, Bld: 131 mg/dL — ABNORMAL HIGH (ref 70–99)
Potassium: 3.2 mmol/L — ABNORMAL LOW (ref 3.5–5.1)
Sodium: 135 mmol/L (ref 135–145)

## 2022-07-29 LAB — PREGNANCY, URINE: Preg Test, Ur: NEGATIVE

## 2022-07-29 LAB — TROPONIN I (HIGH SENSITIVITY)
Troponin I (High Sensitivity): 3 ng/L (ref <18)
Troponin I (High Sensitivity): 2 ng/L (ref <18)

## 2022-07-29 LAB — BRAIN NATRIURETIC PEPTIDE: B Natriuretic Peptide: 7 pg/mL (ref 0.0–100.0)

## 2022-07-29 MED ORDER — KETOROLAC TROMETHAMINE 15 MG/ML IJ SOLN
15.0000 mg | Freq: Once | INTRAMUSCULAR | Status: AC
  Administered 2022-07-29: 15 mg via INTRAVENOUS
  Filled 2022-07-29: qty 1

## 2022-07-29 MED ORDER — IOHEXOL 350 MG/ML SOLN
100.0000 mL | Freq: Once | INTRAVENOUS | Status: AC | PRN
  Administered 2022-07-29: 75 mL via INTRAVENOUS

## 2022-07-29 MED ORDER — ONDANSETRON HCL 4 MG/2ML IJ SOLN
4.0000 mg | Freq: Once | INTRAMUSCULAR | Status: AC
  Administered 2022-07-29: 4 mg via INTRAVENOUS
  Filled 2022-07-29: qty 2

## 2022-07-29 MED ORDER — MORPHINE SULFATE (PF) 4 MG/ML IV SOLN
4.0000 mg | Freq: Once | INTRAVENOUS | Status: AC
  Administered 2022-07-29: 4 mg via INTRAVENOUS
  Filled 2022-07-29: qty 1

## 2022-07-29 NOTE — ED Triage Notes (Signed)
Reports sharp left chest pain radiating to back , worse with inhalation and motion . Also reports shortness of breath.

## 2022-07-29 NOTE — ED Provider Notes (Signed)
[place] EMERGENCY DEPARTMENT Provider Note   CSN: [id number] Arrival date & time: 07/29/22  0734     History  Chief Complaint  Patient presents with   Chest Pain    Ashley Casey is a 41 y.o. female.  41 yo F with a chief complaint of chest pain.  This is left-sided and pinpoint worse with inspiration and movement.  Started having some back pain that she thought was muscular a few days ago and then developed chest pain and difficulty breathing this morning.  She denies cough congestion or fever denies hemoptysis.  Denies abdominal pain.  Patient denies history of MI, denies hypertension hyperlipidemia diabetes or smoking.  Denies family history of MI.  Patient denies history of PE or DVT denies hemoptysis denies unilateral lower extremity edema denies recent surgery immobilization hospitalization estrogen use or history of cancer.     Chest Pain      Home Medications Prior to Admission medications   Medication Sig Start Date End Date Taking? Authorizing Provider  cyclobenzaprine (FLEXERIL) 10 MG tablet Take 10 mg by mouth 3 (three) times daily as needed. 04/22/22   [provider]  EPINEPHrine 0.3 mg/0.3 mL IJ SOAJ injection Inject as directed See admin instructions.    [provider]  ibuprofen (ADVIL) 800 MG tablet Take 800 mg by mouth every 6 (six) hours as needed. 02/13/22   [provider]  triamcinolone ointment (KENALOG) 0.1 % SMARTSIG:sparingly Topical Twice Daily Patient not taking: Reported on 07/07/2022 06/10/22   [provider]  albuterol (VENTOLIN HFA) 108 (90 Base) MCG/ACT inhaler Inhale 2 puffs into the lungs every 4 (four) hours as needed for wheezing or shortness of breath. 05/03/20 05/03/21  Faustino Congress, NP  clonazePAM (KLONOPIN) 0.5 MG tablet Take 1 tablet (0.5 mg total) by mouth 2 (two) times daily as needed for anxiety. 05/16/19 02/15/20  Ok Edwards, PA-C      Allergies    Food and Tegretol  [carbamazepine]    Review of Systems   Review of Systems  Cardiovascular:  Positive for chest pain.    Physical Exam Updated Vital Signs BP 103/69   Pulse 75   Temp 98.4 F (36.9 C)   Resp (!) 23   Ht '5\' 4"'$  (1.626 m)   Wt 80.7 kg   LMP 07/06/2022 (Exact Date) Comment: neg upreg in er.  SpO2 100%   BMI 30.55 kg/m  Physical Exam Vitals and nursing note reviewed.  Constitutional:      General: She is not in acute distress.    Appearance: She is well-developed. She is not diaphoretic.  HENT:     Head: Normocephalic and atraumatic.  Eyes:     Pupils: Pupils are equal, round, and reactive to light.  Cardiovascular:     Rate and Rhythm: Normal rate and regular rhythm.     Heart sounds: No murmur heard.    No friction rub. No gallop.  Pulmonary:     Effort: Pulmonary effort is normal.     Breath sounds: No wheezing or rales.  Chest:     Chest wall: Tenderness present.     Comments: Pinpoint chest pain about the medial clavicular line about rib 5. Abdominal:     General: There is no distension.     Palpations: Abdomen is soft.     Tenderness: There is no abdominal tenderness.  Musculoskeletal:        General: No tenderness.     Cervical back: Normal range  of motion and neck supple.  Skin:    General: Skin is warm and dry.  Neurological:     Mental Status: She is alert and oriented to person, place, and time.  Psychiatric:        Behavior: Behavior normal.     ED Results / Procedures / Treatments   Labs (all labs ordered are listed, but only abnormal results are displayed) Labs Reviewed  CBC WITH DIFFERENTIAL/PLATELET - Abnormal; Notable for the following components:      Result Value   Hemoglobin 10.0 (*)    HCT 32.8 (*)    MCV 77.2 (*)    MCH 23.5 (*)    All other components within normal limits  BASIC METABOLIC PANEL - Abnormal; Notable for the following components:   Potassium 3.2 (*)    Glucose, Bld 131 (*)    Calcium 8.4 (*)    All other components  within normal limits  D-DIMER, QUANTITATIVE - Abnormal; Notable for the following components:   D-Dimer, Quant 1.90 (*)    All other components within normal limits  BRAIN NATRIURETIC PEPTIDE  PREGNANCY, URINE  TROPONIN I (HIGH SENSITIVITY)  TROPONIN I (HIGH SENSITIVITY)    EKG EKG Interpretation  Date/Time:  Tuesday July 29 2022 07:46:14 EDT Ventricular Rate:  95 PR Interval:  160 QRS Duration: 95 QT Interval:  332 QTC Calculation: 418 R Axis:   58 Text Interpretation: Sinus rhythm t wave flattening in lead III Otherwise no significant change Confirmed by Deno Etienne [phone]) on 07/29/2022 7:46:48 AM  Radiology CT Angio Chest PE W and/or Wo Contrast  Result Date: 07/29/2022 CLINICAL DATA:  41 year old female with concern for pulmonary embolism. EXAM: CT ANGIOGRAPHY CHEST WITH CONTRAST TECHNIQUE: Multidetector CT imaging of the chest was performed using the standard protocol during bolus administration of intravenous contrast. Multiplanar CT image reconstructions and MIPs were obtained to evaluate the vascular anatomy. RADIATION DOSE REDUCTION: This exam was performed according to the departmental dose-optimization program which includes automated exposure control, adjustment of the mA and/or kV according to patient size and/or use of iterative reconstruction technique. CONTRAST:  Seventy-five mL Omnipaque 350, intravenous COMPARISON:  None Available. FINDINGS: Cardiovascular: Satisfactory opacification of the pulmonary arteries to the segmental level. No evidence of pulmonary embolism. Normal heart size. No pericardial effusion. Mediastinum/Nodes: No enlarged mediastinal, hilar, or axillary lymph nodes. Thyroid gland, trachea, and esophagus demonstrate no significant findings. Lungs/Pleura: Mild diffuse mosaic attenuation of the pulmonary parenchyma no focal consolidations. No suspicious pulmonary nodules. No pleural effusion or pneumothorax. Upper Abdomen: The visualized upper abdomen is  within normal limits. Musculoskeletal: Sclerotic focus in the anterior T6 vertebral body, favored represent bone island. No acute osseous abnormality. Review of the MIP images confirms the above findings. IMPRESSION: Vascular: No evidence of pulmonary embolism. Non-Vascular: No acute intrathoracic abnormality. Ruthann Cancer, MD [facility] Radiology Electronically Signed   By: Ruthann Cancer M.D.   On: 07/29/2022 11:28   DG Chest Port 1 View  Result Date: 07/29/2022 CLINICAL DATA:  Provided history: Chest pain. Additional history provided: Chest pain, worse with movement and deep breathing, shortness of breath. EXAM: PORTABLE CHEST 1 VIEW COMPARISON:  No pertinent prior exams available for comparison. FINDINGS: Heart size within normal limits. Vague opacity within the bilateral lung bases. No evidence of pleural effusion or pneumothorax. No acute bony abnormality identified. IMPRESSION: Vague opacity within the bilateral lung bases, which may reflect atelectasis, edema or infection. Electronically Signed   By: Kellie Simmering  D.O.   On: 07/29/2022 08:19    Procedures Procedures    Medications Ordered in ED Medications  morphine (PF) 4 MG/ML injection 4 mg (4 mg Intravenous Given 07/29/22 0818)  ondansetron (ZOFRAN) injection 4 mg (4 mg Intravenous Given 07/29/22 0818)  iohexol (OMNIPAQUE) 350 MG/ML injection 100 mL (75 mLs Intravenous Contrast Given 07/29/22 1103)  ketorolac (TORADOL) 15 MG/ML injection 15 mg (15 mg Intravenous Given 07/29/22 1151)    ED Course/ Medical Decision Making/ A&P                           Medical Decision Making Amount and/or Complexity of Data Reviewed Labs: ordered. Radiology: ordered.  Risk Prescription drug management.   41 yo F with a chief complaints of chest pain.  This is atypical in nature and reproduced on exam.  Most likely this is musculoskeletal by history and physical that the patient is quite tachypneic  breathing about 30 times a minute and has significant discomfort.  We will obtain a laboratory evaluation and chest x-ray.  My pretest probability for pulmonary embolism is quite low though she was tachycardic on arrival and is a bit tachypneic will obtain a D-dimer.  2 troponins are negative.  D-dimer is elevated at 1.9 and so a CT scan was ordered.  CT angiogram of the chest was negative for pulmonary embolism or occult pneumonia.  I discussed results with patient.  We will treat as musculoskeletal.  PCP follow-up.  1:07 PM:  I have discussed the diagnosis/risks/treatment options with the patient.  Evaluation and diagnostic testing in the emergency department does not suggest an emergent condition requiring admission or immediate intervention beyond what has been performed at this time.  They will follow up with  PCP. We also discussed returning to the ED immediately if new or worsening sx occur. We discussed the sx which are most concerning (e.g., sudden worsening pain, fever, inability to tolerate by mouth) that necessitate immediate return. Medications administered to the patient during their visit and any new prescriptions provided to the patient are listed below.  Medications given during this visit Medications  morphine (PF) 4 MG/ML injection 4 mg (4 mg Intravenous Given 07/29/22 0818)  ondansetron (ZOFRAN) injection 4 mg (4 mg Intravenous Given 07/29/22 0818)  iohexol (OMNIPAQUE) 350 MG/ML injection 100 mL (75 mLs Intravenous Contrast Given 07/29/22 1103)  ketorolac (TORADOL) 15 MG/ML injection 15 mg (15 mg Intravenous Given 07/29/22 1151)     The patient appears reasonably screen and/or stabilized for discharge and I doubt any other medical condition or other [facility] requiring further screening, evaluation, or treatment in the ED at this time prior to discharge.          Final Clinical Impression(s) / ED Diagnoses Final diagnoses:  Nonspecific chest pain    Rx / DC Orders ED Discharge  Orders     None         Deno Etienne, DO 07/29/22 1307

## 2022-07-29 NOTE — Discharge Instructions (Signed)
Take 4 over the counter ibuprofen tablets 3 times a day or 2 over-the-counter naproxen tablets twice a day for pain. Also take tylenol '1000mg'$ (2 extra strength) four times a day.   Please return if your pain suddenly worsens if you are coughing up blood if you are passing out.  Follow-up with your family doctor in the office.

## 2022-08-21 ENCOUNTER — Ambulatory Visit (HOSPITAL_BASED_OUTPATIENT_CLINIC_OR_DEPARTMENT_OTHER)
Admit: 2022-08-21 | Discharge: 2022-08-21 | Disposition: A | Discharge status: Home / Self Care | Payer: 59 | Attending: Physician Assistant | Admitting: Physician Assistant

## 2022-08-21 ENCOUNTER — Other Ambulatory Visit: Payer: Self-pay

## 2022-08-21 ENCOUNTER — Encounter: Payer: Self-pay | Admitting: Emergency Medicine

## 2022-08-21 ENCOUNTER — Ambulatory Visit
Admission: EM | Admit: 2022-08-21 | Discharge: 2022-08-21 | Disposition: A | Discharge status: Home / Self Care | Payer: 59 | Attending: Physician Assistant | Admitting: Physician Assistant

## 2022-08-21 DIAGNOSIS — R52 Pain, unspecified: Secondary | ICD-10-CM | POA: Diagnosis present

## 2022-08-21 DIAGNOSIS — M5412 Radiculopathy, cervical region: Secondary | ICD-10-CM | POA: Insufficient documentation

## 2022-08-21 DIAGNOSIS — M79601 Pain in right arm: Secondary | ICD-10-CM | POA: Insufficient documentation

## 2022-08-21 MED ORDER — PREDNISONE 10 MG (21) PO TBPK: ORAL_TABLET | ORAL | 0 refills | Status: DC

## 2022-08-21 MED ORDER — CYCLOBENZAPRINE HCL 10 MG PO TABS
10.0000 mg | ORAL_TABLET | Freq: Two times a day (BID) | ORAL | 0 refills | Status: DC | PRN
Start: 2022-08-21 — End: 2023-01-12

## 2022-08-21 MED ORDER — TRAMADOL HCL 50 MG PO TABS: 50.0000 mg | ORAL_TABLET | Freq: Two times a day (BID) | ORAL | 0 refills | Status: AC | PRN

## 2022-08-21 MED ORDER — KETOROLAC TROMETHAMINE 60 MG/2ML IM SOLN
60.0000 mg | Freq: Once | INTRAMUSCULAR | Status: AC
  Administered 2022-08-21: 60 mg via INTRAMUSCULAR

## 2022-08-21 NOTE — Discharge Instructions (Signed)
I believe that you have a nerve issue causing your pain.  We gave you an injection of ketorolac today.  Do not take NSAIDs including aspirin, ibuprofen/Advil, naproxen/Aleve for minimum of 12 hours and you should hold them while you are on prednisone (for the next week).  Start prednisone taper.  Do not take NSAIDs with this medication.  Use cyclobenzaprine up to twice a day for pain and muscle relaxation.  This will make you sleepy do not drive or drink alcohol with taking it.  I have called in a few doses of tramadol as well but this also will make you sleepy so do not drive or drink alcohol while taking it.  Use heat, rest, stretch for symptom relief.  Go get the x-ray at [facility] (addresses below).  I will call you with these results.  I think you should follow-up with an orthopedic.  Call them to schedule an appointment soon as possible as they can do an MRI and additional testing.  If anything worsens and you have weakness, numbness, increased pain, fever, decreased range of motion you should go to the emergency room.  [place] [employer] [address] [place], [place] [address]

## 2022-08-21 NOTE — ED Triage Notes (Addendum)
Right wrist pain, radiating into right hand for 3 days.  Patient is right handed.  patient has pain in right shoulder, but this is tolerable per patient.  Pain described as shooting pain.  No known injury

## 2022-08-21 NOTE — ED Provider Notes (Signed)
[name]    CSN: [id number] Arrival date & time: 08/21/22  1217      History   Chief Complaint Chief Complaint  Patient presents with   Arm Pain    HPI Ashley Casey is a 41 y.o. female.   Patient presents today with 3-day history of shooting pain in her right arm.  She reports some associated neck/back/shoulder pain but states this is tolerable.  Her primary concern today is pain in her arm and hand particularly with certain movements.  She denies any known injury or increased activity prior to symptom onset.  Denies history of degenerative disc disease in the cervical region or previous injury/surgery involving her spine.  She denies history of malignancy.  She has tried multiple over-the-counter medications including Tylenol, ibuprofen without improvement of symptoms.  She also reports taking Flexeril as well as tramadol with dulling of the pain enough that she is able to rest but not resolution.  She is right-handed and still having difficulty with her daily activities as result of symptoms.  She denies history of diabetes.  Denies history of malignancy.  She is confident that she is not pregnant.  Patient reports that pain is now essentially unbearable prompting evaluation today.      Past Medical History:  Diagnosis Date   Abnormal Pap smear    Anxiety    in past   Bell's palsy    after 5th pregnancy never resolved   Cholelithiasis    Fatigue    Gestational diabetes    metformin   Gestational diabetes mellitus, antepartum    History of shingles    IBS (irritable bowel syndrome)    Neuromuscular disorder [facility])     Patient Active Problem List   Diagnosis Date Noted   Prediabetes 06/12/2021   Left-sided Bell's palsy 06/12/2021   History of gestational diabetes 06/12/2021   Pregnancy 11/18/2014   Dependency on pain medication ([place]) 10/17/2013   Symptomatic cholelithiasis 07/28/2011    Past Surgical History:  Procedure Laterality Date    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     CHOLECYSTECTOMY  8/29/121   THERAPEUTIC ABORTION     WISDOM TOOTH EXTRACTION      OB History     Gravida  12   Para  7   Term  7   Preterm      AB  4   Living  7      SAB  3   IAB  1   Ectopic      Multiple  0   Live Births  7            Home Medications    Prior to Admission medications   Medication Sig Start Date End Date Taking? Authorizing Provider  predniSONE (STERAPRED UNI-PAK 21 TAB) 10 MG (21) TBPK tablet As directed 08/21/22  Yes Catrice Zuleta K, PA-C  traMADol (ULTRAM) 50 MG tablet Take 1 tablet (50 mg total) by mouth every 12 (twelve) hours as needed for up to 3 days. 08/21/22 08/24/22 Yes Makaylia Hewett K, PA-C  cyclobenzaprine (FLEXERIL) 10 MG tablet Take 1 tablet (10 mg total) by mouth 2 (two) times daily as needed. 08/21/22   Arelyn Gauer, Derry Skill, PA-C  EPINEPHrine 0.3 mg/0.3 mL IJ SOAJ injection Inject as directed See admin instructions.    [provider]  triamcinolone ointment (KENALOG) 0.1 % SMARTSIG:sparingly Topical Twice Daily Patient not taking: Reported on 07/07/2022 06/10/22   [provider]  albuterol (VENTOLIN HFA)  108 (90 Base) MCG/ACT inhaler Inhale 2 puffs into the lungs every 4 (four) hours as needed for wheezing or shortness of breath. 05/03/20 05/03/21  Faustino Congress, NP  clonazePAM (KLONOPIN) 0.5 MG tablet Take 1 tablet (0.5 mg total) by mouth 2 (two) times daily as needed for anxiety. 05/16/19 02/15/20  Ok Edwards, PA-C    Family History Family History  Problem Relation Age of Onset   Other Mother        sickle cell anemia   Diabetes Mother    Sickle cell anemia Mother    Arthritis Brother    Asthma Brother    Diabetes Maternal Grandmother    Diabetes Paternal Grandmother     Social History Social History   Tobacco Use   Smoking status: Never    Passive exposure: Never   Smokeless tobacco: Never  Vaping Use   Vaping Use: Never used  Substance Use Topics   Alcohol  use: No   Drug use: No     Allergies   Food and Tegretol [carbamazepine]   Review of Systems Review of Systems  Constitutional:  Positive for activity change. Negative for appetite change, fatigue and fever.  Respiratory:  Negative for cough and shortness of breath.   Cardiovascular:  Negative for chest pain.  Gastrointestinal:  Negative for abdominal pain, diarrhea, nausea and vomiting.  Musculoskeletal:  Positive for arthralgias, myalgias and neck pain.  Neurological:  Positive for numbness (right hand). Negative for dizziness, weakness, light-headedness and headaches.     Physical Exam Triage Vital Signs ED Triage Vitals  Enc Vitals Group     BP 08/21/22 1241 118/81     Pulse Rate 08/21/22 1241 89     Resp 08/21/22 1241 18     Temp 08/21/22 1241 98 F (36.7 C)     Temp Source 08/21/22 1241 Oral     SpO2 08/21/22 1241 98 %     Weight --      Height --      Head Circumference --      Peak Flow --      Pain Score 08/21/22 1238 10     Pain Loc --      Pain Edu? --      Excl. in [place]? --    No data found.  Updated Vital Signs BP 118/81 (BP Location: Left Arm) Comment (BP Location): large cuff  Pulse 89   Temp 98 F (36.7 C) (Oral)   Resp 18   LMP 08/04/2022 (Exact Date)   SpO2 98%   Visual Acuity Right Eye Distance:   Left Eye Distance:   Bilateral Distance:    Right Eye Near:   Left Eye Near:    Bilateral Near:     Physical Exam Vitals reviewed.  Constitutional:      General: She is awake. She is not in acute distress.    Appearance: Normal appearance. She is well-developed. She is not ill-appearing.  HENT:     Head: Normocephalic and atraumatic.  Cardiovascular:     Rate and Rhythm: Normal rate and regular rhythm.     Pulses:          Radial pulses are 2+ on the right side and 2+ on the left side.     Heart sounds: Normal heart sounds, S1 normal and S2 normal. No murmur heard.    Comments: Capillary fill within 2 seconds right fingers Pulmonary:      Effort: Pulmonary effort is normal.  Breath sounds: Normal breath sounds. No wheezing, rhonchi or rales.  Musculoskeletal:     Right shoulder: Swelling, tenderness and bony tenderness present. No deformity. Decreased range of motion. Normal strength.     Right hand: Normal range of motion. There is no disruption of two-point discrimination. Normal capillary refill.     Cervical back: No tenderness or bony tenderness.     Thoracic back: No tenderness or bony tenderness.     Lumbar back: No tenderness or bony tenderness.     Comments: Right arm: Tenderness palpation over right trapezius and at [facility] joint without deformity.  Decreased range of motion with abduction, overhead flexion, extension secondary to pain.  Patient has full active range of motion of elbow and wrist though it is painful to move.  Hand is neurovascularly intact.  Normal pincer and grip strength.  Psychiatric:        Behavior: Behavior is cooperative.      UC Treatments / Results  Labs (all labs ordered are listed, but only abnormal results are displayed) Labs Reviewed - No data to display  EKG   Radiology No results found.  Procedures Procedures (including critical care time)  Medications Ordered in UC Medications  ketorolac (TORADOL) injection 60 mg (60 mg Intramuscular Given 08/21/22 1319)    Initial Impression / Assessment and Plan / UC Course  I have reviewed the triage vital signs and the nursing notes.  Pertinent labs & imaging results that were available during my care of the patient were reviewed by me and considered in my medical decision making (see chart for details).     Concern for cervical radiculopathy given clinical presentation.  X-ray was ordered but we do not have x-ray onsite so patient will need to go to [place] to have this done outpatient.  We will contact her with results.  She was given injection of ketorolac in clinic with instruction to avoid NSAIDs for 12 hours.   Reports pain did improve but did not resolve with this medication.  She was prescribed prednisone given severity of pain with instruction to take NSAIDs with this medication.  Flexeril sent to pharmacy as well as a few doses of tramadol with instruction not to drive or drink alcohol with taking this medication.  Review of [place] controlled substance database shows no inappropriate refills of narcotics; last received a refill of hydrocodone for 3 to 4 days on 07/28/2022.  Discussed that ultimately she will need to see an orthopedist that she would likely benefit from MRI or more advanced imaging.  We are unable to arrange this in urgent care.  She was given contact information for orthopedic on-call with instruction to call to schedule appointment soon as possible.  Discussed that if she has any worsening symptoms she needs to be seen immediately.  Strict return precautions given.  Work excuse note provided.  Final Clinical Impressions(s) / UC Diagnoses   Final diagnoses:  Cervical radiculopathy  Right arm pain  Shooting pain     Discharge Instructions      I believe that you have a nerve issue causing your pain.  We gave you an injection of ketorolac today.  Do not take NSAIDs including aspirin, ibuprofen/Advil, naproxen/Aleve for minimum of 12 hours and you should hold them while you are on prednisone (for the next week).  Start prednisone taper.  Do not take NSAIDs with this medication.  Use cyclobenzaprine up to twice a day for pain and muscle relaxation.  This will make you sleepy do not drive or drink alcohol with taking it.  I have called in a few doses of tramadol as well but this also will make you sleepy so do not drive or drink alcohol while taking it.  Use heat, rest, stretch for symptom relief.  Go get the x-ray at [facility] (addresses below).  I will call you with these results.  I think you should follow-up with an orthopedic.  Call them to schedule an appointment soon  as possible as they can do an MRI and additional testing.  If anything worsens and you have weakness, numbness, increased pain, fever, decreased range of motion you should go to the emergency room.  [facility] [address] [place], [place] [address]     ED Prescriptions     Medication Sig Dispense Auth. Provider   cyclobenzaprine (FLEXERIL) 10 MG tablet Take 1 tablet (10 mg total) by mouth 2 (two) times daily as needed. 20 tablet Robinson Brinkley K, PA-C   predniSONE (STERAPRED UNI-PAK 21 TAB) 10 MG (21) TBPK tablet As directed 21 tablet Kemauri Musa K, PA-C   traMADol (ULTRAM) 50 MG tablet Take 1 tablet (50 mg total) by mouth every 12 (twelve) hours as needed for up to 3 days. 6 tablet Trudie Cervantes K, PA-C      I have reviewed the PDMP during this encounter.   Terrilee Croak, PA-C 08/21/22 1356

## 2022-09-05 ENCOUNTER — Ambulatory Visit (INDEPENDENT_AMBULATORY_CARE_PROVIDER_SITE_OTHER): Payer: 59 | Admitting: Obstetrics and Gynecology

## 2022-09-05 ENCOUNTER — Encounter: Payer: Self-pay | Admitting: Obstetrics and Gynecology

## 2022-09-05 DIAGNOSIS — D249 Benign neoplasm of unspecified breast: Secondary | ICD-10-CM | POA: Diagnosis not present

## 2022-09-05 DIAGNOSIS — N9089 Other specified noninflammatory disorders of vulva and perineum: Secondary | ICD-10-CM | POA: Diagnosis not present

## 2022-09-05 NOTE — Progress Notes (Signed)
  CC: vulvar lesions Subjective:    Patient ID: Ashley Casey, female    DOB: [birth year], 41 y.o.   MRN: [id number]  HPI 41 yo G12P7 seen for second opinion on vulvar lesion.  Pt has been a patient at [facility] as well as recently at [facility].  Pt has had two separate episodes of what appeared to be ulcerative lesions to her vulva.  The patient brought several pictures. The pictures showed erythematous ulcerative lesions above the cltioral region as well as the posterior face of the vulva. She states she was swabbed for HSV and was told it was negative.  Biopsies were recommended but the patient abstained.  The patient noted the lesions did not itch but did burn if touched by urine.  She denies any trauma to the area or any cosmentic/feminine hygiene changes.   Review of Systems     Objective:   Physical Exam Vitals:   09/05/22 0953  BP: 116/79  Pulse: 92         Assessment & Plan:   1. Vulvar lesion Will try to get records from [facility] regarding pap and HSV Will check RPR on the off chance these are chancre lesions Refer to dermatology in case these lesions return   Annual exam in 3 months - RPR - Ambulatory referral to Dermatology  2. Fibroadenoma of breast, unspecified laterality Noted on chart review    Griffin Basil, MD Faculty Attending, Center for [facility]

## 2022-09-05 NOTE — Progress Notes (Signed)
Pt states she has raw patches in the vaginal area for a few weeks. Irritation started 6/23 when staying at a relatives house. Pt denies using new product.  Issue resolved with otc treatment and came back a few weeks later. No irritation today. Pt was prescribed triamcinolone ointment which caused burning. Pt was treated at urgent care, ED and primary GYN for this issue and wants a second opinion.

## 2022-09-08 ENCOUNTER — Telehealth: Payer: Self-pay

## 2022-09-08 NOTE — Telephone Encounter (Signed)
[place]ley Casey from the state called to seen if we have informed patient of RPR results. Georgina Snell informed that we have not received her results yet. Patient will be contacted once results are received.

## 2022-09-11 LAB — RPR, QUANT+TP ABS (REFLEX)
Rapid Plasma Reagin, Quant: 1:32 {titer} — ABNORMAL HIGH
T Pallidum Abs: REACTIVE — AB

## 2022-09-11 LAB — RPR: RPR Ser Ql: REACTIVE — AB

## 2022-09-15 ENCOUNTER — Telehealth: Payer: Self-pay | Admitting: *Deleted

## 2022-09-15 NOTE — Telephone Encounter (Addendum)
-----   Message from Griffin Basil, MD sent at 09/14/2022  2:27 PM EDT ----- RPR and confirmatory test positive, arrange treatment at [facility], this is a [facility] patient  9/25  1230  Called pt and she did not answer. I left a message stating that I am calling with test result information.  I will call back later today.

## 2022-09-16 NOTE — Telephone Encounter (Signed)
Called patient, no answer- left message to call us back. Per chart review, mychart message has been sent but patient hasn't read it.

## 2022-09-18 NOTE — Telephone Encounter (Signed)
Per chart review, pt read MyChart results message on 09/17/22. Called pt; VM left stating I am calling with results. MyChart message sent.

## 2022-10-01 ENCOUNTER — Emergency Department (HOSPITAL_COMMUNITY): Payer: 59

## 2022-10-01 ENCOUNTER — Other Ambulatory Visit: Payer: Self-pay

## 2022-10-01 ENCOUNTER — Encounter (HOSPITAL_COMMUNITY): Payer: Self-pay

## 2022-10-01 ENCOUNTER — Emergency Department (HOSPITAL_COMMUNITY)
Admission: EM | Admit: 2022-10-01 | Discharge: 2022-10-01 | Disposition: A | Discharge status: Home / Self Care | Payer: 59 | Attending: Emergency Medicine | Admitting: Emergency Medicine

## 2022-10-01 DIAGNOSIS — S61031A Puncture wound without foreign body of right thumb without damage to nail, initial encounter: Secondary | ICD-10-CM | POA: Diagnosis not present

## 2022-10-01 DIAGNOSIS — T7840XA Allergy, unspecified, initial encounter: Secondary | ICD-10-CM | POA: Diagnosis present

## 2022-10-01 DIAGNOSIS — X58XXXA Exposure to other specified factors, initial encounter: Secondary | ICD-10-CM | POA: Diagnosis not present

## 2022-10-01 LAB — I-STAT BETA HCG BLOOD, ED (MC, WL, AP ONLY): I-stat hCG, quantitative: <5 m[IU]/mL (ref <5)

## 2022-10-01 MED ORDER — EPINEPHRINE 0.3 MG/0.3ML IJ SOAJ: 0.3000 mg | INTRAMUSCULAR | 1 refills | Status: DC | PRN

## 2022-10-01 MED ORDER — PREDNISONE 10 MG PO TABS: 40.0000 mg | ORAL_TABLET | Freq: Every day | ORAL | 0 refills | Status: AC

## 2022-10-01 MED ORDER — ALBUTEROL SULFATE HFA 108 (90 BASE) MCG/ACT IN AERS: 1.0000 | INHALATION_SPRAY | RESPIRATORY_TRACT | 0 refills | Status: DC | PRN

## 2022-10-01 MED ORDER — SODIUM CHLORIDE 0.9 % IV BOLUS
1000.0000 mL | Freq: Once | INTRAVENOUS | Status: AC
  Administered 2022-10-01: 1000 mL via INTRAVENOUS

## 2022-10-01 MED ORDER — FAMOTIDINE IN NACL 20-0.9 MG/50ML-% IV SOLN
20.0000 mg | Freq: Once | INTRAVENOUS | Status: AC
  Administered 2022-10-01: 20 mg via INTRAVENOUS
  Filled 2022-10-01: qty 50

## 2022-10-01 MED ORDER — DIPHENHYDRAMINE HCL 25 MG PO TABS: 25.0000 mg | ORAL_TABLET | Freq: Three times a day (TID) | ORAL | 0 refills | Status: DC

## 2022-10-01 MED ORDER — EPINEPHRINE 0.3 MG/0.3ML IJ SOAJ
0.3000 mg | Freq: Once | INTRAMUSCULAR | Status: AC | PRN
  Administered 2022-10-01: 0.3 mg via INTRAMUSCULAR
  Filled 2022-10-01: qty 0.3

## 2022-10-01 MED ORDER — METHYLPREDNISOLONE SODIUM SUCC 125 MG IJ SOLR
125.0000 mg | Freq: Once | INTRAMUSCULAR | Status: AC
  Administered 2022-10-01: 125 mg via INTRAVENOUS
  Filled 2022-10-01: qty 2

## 2022-10-01 NOTE — ED Triage Notes (Signed)
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 Patient got close to oranges in store. Patient attempted to give self epi and got her thumb. Unknown if patient received any medication.. Patient given 0.3 of epi from EMS. 50 benadryl and 5 of albuterol given as well

## 2022-10-01 NOTE — ED Provider Notes (Signed)
[facility] EMERGENCY DEPARTMENT Provider Note   CSN: [id number] Arrival date & time: 10/01/22  1410     History  Chief Complaint  Patient presents with   Allergic Reaction    Ashley Casey is a 41 y.o. female reports an allergy to oranges and grapefruit is otherwise healthy.  Patient reports that she was at the store today when she came into contact with some oranges shortly afterwards she began feeling some difficulty breathing along with coughing nausea and vomiting.  She reports she attempted to give herself an EpiPen but accidentally stuck her left thumb.  EMS was called and patient received 0.3 mg epi IM by EMS along with p.o. Benadryl.  EMS could not start IV so patient had not received any additional medications.  On my initial evaluation patient reports she is feeling better.  She denies any continued difficulty breathing or vomiting.  She does report some nausea.  She denies any rash, diarrhea or any additional concerns.  HPI     Home Medications Prior to Admission medications   Medication Sig Start Date End Date Taking? Authorizing Provider  albuterol (VENTOLIN HFA) 108 (90 Base) MCG/ACT inhaler Inhale 1-2 puffs into the lungs as needed for wheezing or shortness of breath. 10/01/22  Yes Nuala Alpha A, PA-C  cyclobenzaprine (FLEXERIL) 10 MG tablet Take 1 tablet (10 mg total) by mouth 2 (two) times daily as needed. Patient taking differently: Take 10 mg by mouth 3 (three) times daily as needed for muscle spasms. 08/21/22  Yes Raspet, Erin K, PA-C  diphenhydrAMINE (BENADRYL) 25 MG tablet Take 1 tablet (25 mg total) by mouth every 8 (eight) hours. 10/01/22  Yes Nuala Alpha A, PA-C  EPINEPHrine 0.3 mg/0.3 mL IJ SOAJ injection Inject 0.3 mg into the muscle as needed for anaphylaxis.   Yes [provider]  EPINEPHrine 0.3 mg/0.3 mL IJ SOAJ injection Inject 0.3 mg into the muscle as needed for anaphylaxis. 10/01/22  Yes Nuala Alpha A,  PA-C  ibuprofen (ADVIL) 800 MG tablet Take 800 mg by mouth every 8 (eight) hours as needed for moderate pain.   Yes [provider]  predniSONE (DELTASONE) 10 MG tablet Take 4 tablets (40 mg total) by mouth daily for 5 days. 10/01/22 10/06/22 Yes Nuala Alpha A, PA-C  Prenatal Vit-Fe Fumarate-FA (PRENATAL VITAMIN PO) Take 1 tablet by mouth daily.   Yes [provider]  clonazePAM (KLONOPIN) 0.5 MG tablet Take 1 tablet (0.5 mg total) by mouth 2 (two) times daily as needed for anxiety. 05/16/19 02/15/20  Ok Edwards, PA-C      Allergies    Food and Tegretol [carbamazepine]    Review of Systems   Review of Systems Ten systems are reviewed and are negative for acute change except as noted in the HPI   Physical Exam Updated Vital Signs BP 111/77   Pulse 94   Temp 99.2 F (37.3 C) (Oral)   Resp 14   Ht '5\' 4"'$  (1.626 m)   Wt 82 kg   SpO2 100%   BMI 31.03 kg/m  Physical Exam Constitutional:      General: She is not in acute distress.    Appearance: Normal appearance. She is well-developed. She is not ill-appearing or diaphoretic.  HENT:     Head: Normocephalic and atraumatic.     Mouth/Throat:     Mouth: Mucous membranes are moist.     Pharynx: Oropharynx is clear. Uvula midline.  Eyes:     General:  Vision grossly intact. Gaze aligned appropriately.     Pupils: Pupils are equal, round, and reactive to light.  Neck:     Trachea: Trachea and phonation normal.  Pulmonary:     Effort: Pulmonary effort is normal. No respiratory distress.     Breath sounds: Normal breath sounds and air entry.  Abdominal:     General: There is no distension.     Palpations: Abdomen is soft.     Tenderness: There is no abdominal tenderness. There is no guarding or rebound.  Musculoskeletal:        General: Normal range of motion.     Cervical back: Normal range of motion and neck supple.     Comments: Right thumb: Small puncture wound in the middle of the pad.  No bleeding or  surrounding erythema.  Capillary refill and sensation intact to thumb.  Diffuse tenderness with palpation of the pad.  Full ROM with flexion extension of the IP joint with minimal pain.  Full ROM of the MCP.  Strong radial pulse.  Compartments soft.  Skin:    General: Skin is warm and dry.     Findings: No rash.  Neurological:     Mental Status: She is alert.     GCS: GCS eye subscore is 4. GCS verbal subscore is 5. GCS motor subscore is 6.     Comments: Speech is clear and goal oriented, follows commands Major Cranial nerves without deficit, no facial droop Moves extremities without ataxia, coordination intact  Psychiatric:        Behavior: Behavior normal.     ED Results / Procedures / Treatments   Labs (all labs ordered are listed, but only abnormal results are displayed) Labs Reviewed  I-STAT BETA HCG BLOOD, ED ([facility], [facility], [facility] ONLY)    EKG None  Radiology DG Finger Thumb Left  Result Date: 10/01/2022 CLINICAL DATA:  EpiPen injury EXAM: LEFT THUMB 2+V COMPARISON:  None Available. FINDINGS: No acute fracture or dislocation.The joint spaces are preserved.Alignment is unremarkable.No foreign body. Possible trace air within the soft tissues of the distal thumb. IMPRESSION: 1. No acute fracture or dislocation. 2. Possible trace air within the soft tissues of the distal thumb. Electronically Signed   By: Merilyn Baba M.D.   On: 10/01/2022 14:53    Procedures Procedures    Medications Ordered in ED Medications  methylPREDNISolone sodium succinate (SOLU-MEDROL) 125 mg/2 mL injection 125 mg (125 mg Intravenous Given 10/01/22 1458)  famotidine (PEPCID) IVPB 20 mg premix (0 mg Intravenous Stopped 10/01/22 1537)  sodium chloride 0.9 % bolus 1,000 mL (0 mLs Intravenous Stopped 10/01/22 1708)  EPINEPHrine (EPI-PEN) injection 0.3 mg (0.3 mg Intramuscular Given 10/01/22 1501)    ED Course/ Medical Decision Making/ A&P Clinical Course as of 10/01/22 1908  Wed Oct 01, 2022  1518 Obs until  7 [BM]  1540 Patient reassessed, resting calmly bed, no recurrence of symptoms. [BM]  1556 DG Finger Thumb Left I personally reviewed and interpreted patient's three-view x-ray of her left thumb.  I do not appreciate any obvious acute fracture, dislocation or bony lesions.  I agree with radiologist patient likely has some air within the pad of the left thumb due to is having stuck herself with the EpiPen. [BM]  1620 Patient reassessed resting company bed no acute distress, no recurrence of symptoms. [BM]  1726 Patient reassessed she is sitting comfortably in bed no acute distress she has finished eating a sandwich.  She reports she is feeling well,  daughter at bedside. [BM]  1859 Patient reassessed, she is well-appearing in no acute distress, denies any recurrence of symptoms.  Patient does report some continued pain to her left thumb, cap refill and sensation is intact.  She reports her pain is improving.  Patient will follow-up with PCP regarding this.  Does not have any evidence of necrosis at this time. [BM]    Clinical Course User Index [BM] Gari Crown                           Medical Decision Making 41 year old female presented following allergic reaction she had difficulty breathing along with nausea vomiting after coming in contact with oranges.  She accidentally injected stuck her EpiPen into her left thumb.  She received epinephrine along with p.o. Benadryl by EMS.  Symptoms have improved at this point.  I have ordered IV Medrol, IV Pepcid along with fluid bolus.  We will continue monitoring patient.  Given patient's reported severity of her reaction additional EPI was ordered as needed, this was given to patient.  She has been monitored since that time without adverse reaction.  No return of symptoms.  Patient is on cardiac monitor.  Amount and/or Complexity of Data Reviewed Radiology: ordered. Decision-making details documented in ED Course.  Risk Prescription drug  management. Risk Details: Patient was observed for over 4 hours after her most recent dose of epinephrine.  She is well-appearing and in no acute distress she reports no recurrence of symptoms.  Patient is requesting discharge.  Patient's EpiPen was refilled today she was also given a prescription for prednisone and Benadryl to help with possible allergic reaction.  Patient has no wheezing on examination today but does report the albuterol inhaler given to her by EMS did help with her difficulty breathing she is requesting an albuterol [place] to go home with we will place a prescription for her.  I asked the patient to check in with her primary care provider sometime within the next few days and I discussed strict ER precautions with the patient today.  All questions were answered.  As far as patient's thumb she will closely observe this area if she develops any new pain or worsening symptoms she will return to the ER.  I advised to have this area rechecked with her primary care provider.  No evidence for open fracture or necrosis at this time.  Neurovascular intact on reevaluation.     At this time there does not appear to be any evidence of an acute emergency medical condition and the patient appears stable for discharge with appropriate outpatient follow up. Diagnosis was discussed with patient who verbalizes understanding of care plan and is agreeable to discharge. I have discussed return precautions with patient and who verbalizes understanding. Patient encouraged to follow-up with their PCP. All questions answered.  Patient's case discussed with Dr. Sherry Ruffing who agrees with plan to discharge with follow-up.   Note: Portions of this report may have been transcribed using voice recognition software. Every effort was made to ensure accuracy; however, inadvertent computerized transcription errors may still be present.         Final Clinical Impression(s) / ED Diagnoses Final diagnoses:   Allergic reaction, initial encounter    Rx / DC Orders ED Discharge Orders          Ordered    albuterol (VENTOLIN HFA) 108 (90 Base) MCG/ACT inhaler  As needed  10/01/22 1905    EPINEPHrine 0.3 mg/0.3 mL IJ SOAJ injection  As needed        10/01/22 1905    diphenhydrAMINE (BENADRYL) 25 MG tablet  Every 8 hours        10/01/22 1905    predniSONE (DELTASONE) 10 MG tablet  Daily        10/01/22 1905              Gari Crown 10/01/22 [place], [place] K, DO 10/02/22 [phone]

## 2022-10-01 NOTE — Discharge Instructions (Addendum)
At this time there does not appear to be the presence of an emergent medical condition, however there is always the potential for conditions to change. Please read and follow the below instructions.  Please return to the Emergency Department immediately for any new or worsening symptoms. Please be sure to follow up with your Primary Care Provider within one week regarding your visit today; please call their office to schedule an appointment even if you are feeling better for a follow-up visit. You may use the EpiPen as needed for allergic reaction.  If you use the EpiPen call 911 immediately and come to the ER for further treatment. You happen prescribed Benadryl which can use over the next few days.  This medication may make you drowsy so do not drive a car or drink alcohol or perform any digit activities after taking. You have been prescribed a medication prednisone to help with allergic reaction.  This medication may give you trouble sleeping so do not take it too late at night.  You may also notice increased in appetite or irritability.  If you have severe side effects please discontinue taking the medication. As you requested you have been prescribed an albuterol inhaler you may use as needed for wheezing.  Please see your primary care doctor in the next few days for recheck.   Please read the additional information packets attached to your discharge summary.  Go to the nearest Emergency Department immediately if: You have fever or chills Feeling warm in the face (flushed). Your face may turn red. Itchy, red, or swollen areas of skin (hives). Swelling of: The eyes or face. The lips, tongue, or mouth. The throat. Trouble with any of these: Breathing. Talking. Swallowing. Feeling dizzy, light-headed, or like you might faint. Pain or cramps in your belly. Vomiting. Watery poop (diarrhea). You have any new/concerning or worsening of symptoms.  Do not take your medicine if  develop an  itchy rash, swelling in your mouth or lips, or difficulty breathing; call 911 and seek immediate emergency medical attention if this occurs.  You may review your lab tests and imaging results in their entirety on your MyChart account.  Please discuss all results of fully with your primary care provider and other specialist at your follow-up visit.  Note: Portions of this text may have been transcribed using voice recognition software. Every effort was made to ensure accuracy; however, inadvertent computerized transcription errors may still be present.

## 2022-10-03 NOTE — Progress Notes (Deleted)
Patient ID: ARDA DAGGS, female    DOB: 1981/04/06  MRN: [id number]  CC: Emergency Department Follow-Up  Subjective: Ashley Casey is a 41 y.o. female who presents for emergency department follow-up.   Her concerns today include:  10/01/2022 [facility] Emergency Department per MD note: Medical Decision Making 41 year old female presented following allergic reaction she had difficulty breathing along with nausea vomiting after coming in contact with oranges.  She accidentally injected stuck her EpiPen into her left thumb.  She received epinephrine along with p.o. Benadryl by EMS.  Symptoms have improved at this point.  I have ordered IV Medrol, IV Pepcid along with fluid bolus.  We will continue monitoring patient.  Given patient's reported severity of her reaction additional EPI was ordered as needed, this was given to patient.  She has been monitored since that time without adverse reaction.  No return of symptoms.  Patient is on cardiac monitor.   Amount and/or Complexity of Data Reviewed Radiology: ordered. Decision-making details documented in ED Course.   Risk Prescription drug management. Risk Details: Patient was observed for over 4 hours after her most recent dose of epinephrine.  She is well-appearing and in no acute distress she reports no recurrence of symptoms.  Patient is requesting discharge.  Patient's EpiPen was refilled today she was also given a prescription for prednisone and Benadryl to help with possible allergic reaction.  Patient has no wheezing on examination today but does report the albuterol inhaler given to her by EMS did help with her difficulty breathing she is requesting an albuterol Hailer to go home with we will place a prescription for her.  I asked the patient to check in with her primary care provider sometime within the next few days and I discussed strict ER precautions with the patient today.  All questions were answered.   As far as  patient's thumb she will closely observe this area if she develops any new pain or worsening symptoms she will return to the ER.  I advised to have this area rechecked with her primary care provider.  No evidence for open fracture or necrosis at this time.  Neurovascular intact on reevaluation.    At this time there does not appear to be any evidence of an acute emergency medical condition and the patient appears stable for discharge with appropriate outpatient follow up. Diagnosis was discussed with patient who verbalizes understanding of care plan and is agreeable to discharge. I have discussed return precautions with patient and who verbalizes understanding. Patient encouraged to follow-up with their PCP. All questions answered.   Patient's case discussed with Dr. Sherry Ruffing who agrees with plan to discharge with follow-up.    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  She received EpiPen by medics prior to arrival and was hemodynamically stable without any evidence of airway swelling or wheezing on arrival here.  She was given additional Solu-Medrol, Benadryl and Pepcid.  She accidentally self injected her EpiPen in her thumb and compartment syndrome or complication on her exam.  She was observed for 4 hours and discharged with primary care follow-up.  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 1 each 0   cyclobenzaprine (FLEXERIL) 10 MG tablet  Take 1 tablet (10 mg total) by mouth 2 (two) times daily as needed. (Patient taking differently: Take 10 mg by mouth 3 (three) times daily as needed for muscle spasms.) 20 tablet 0   diphenhydrAMINE (BENADRYL) 25 MG tablet Take 1 tablet (25 mg total) by mouth every 8 (eight) hours. 9 tablet 0   EPINEPHrine 0.3 mg/0.3 mL IJ SOAJ injection Inject 0.3 mg into the muscle as needed for anaphylaxis.     EPINEPHrine 0.3 mg/0.3 mL IJ SOAJ injection Inject 0.3 mg into the muscle as needed for anaphylaxis. 1 each 1   ibuprofen (ADVIL) 800 MG tablet Take 800 mg by mouth every 8 (eight) hours as needed for moderate pain.     predniSONE (DELTASONE) 10 MG tablet Take 4 tablets (40 mg total) by mouth daily for 5 days. 20 tablet 0   Prenatal Vit-Fe Fumarate-FA (PRENATAL VITAMIN PO) Take 1 tablet by mouth daily.     [DISCONTINUED] clonazePAM (KLONOPIN) 0.5 MG tablet Take 1 tablet (0.5 mg total) by mouth 2 (two) times daily as needed for anxiety. 6 tablet 0   No current facility-administered medications on file prior to visit.    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     Tegretol [Carbamazepine] Other (See Comments)    Reaction:  Seizures     Social History   Socioeconomic History   Marital status: Married    Spouse name: Not on file   Number of children: Not on file   Years of education: Not on file   Highest education level: Not on file  Occupational History   Not on file  Tobacco Use   Smoking status: Never    Passive exposure: Never   Smokeless tobacco: Never  Vaping Use   Vaping Use: Never used  Substance and Sexual Activity   Alcohol use: No   Drug use: No   Sexual activity: Yes    Partners: Male    Birth control/protection: None  Other Topics Concern   Not on file  Social History Narrative   Not on file   Social Determinants of Health   Financial Resource Strain: Not on file  Food  Insecurity: Not on file  Transportation Needs: Not on file  Physical Activity: Not on file  Stress: Not on file  Social Connections: Not on file  Intimate Partner Violence: Not on file    Family History  Problem Relation Age of Onset   Other Mother        sickle cell anemia   Diabetes Mother    Sickle cell anemia Mother    Arthritis Brother    Asthma Brother    Diabetes Maternal Grandmother    Diabetes Paternal Grandmother     Past Surgical History:  Procedure Laterality Date   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  Physical Exam  {female adult master:310786} {female adult master:310785}  Latest Ref Rng & Units 07/29/2022    8:19 AM 05/03/2021    9:55 AM 02/24/2018    4:18 AM  CMP  Glucose 70 - 99 mg/dL 131  96  138   BUN 6 - 20 mg/dL '13  9  8   '$ Creatinine 0.44 - 1.00 mg/dL 0.76  0.66  0.45   Sodium 135 - 145 mmol/L 135  138  131   Potassium 3.5 - 5.1 mmol/L 3.2  3.9  2.8   Chloride 98 - 111 mmol/L 103  101  103   CO2 22 - 32 mmol/L '26  23  19   '$ Calcium 8.9 - 10.3 mg/dL 8.4  9.5  8.5   Total Protein 6.0 - 8.5 g/dL  8.4  7.3   Total Bilirubin 0.0 - 1.2 mg/dL  <0.2  1.0   Alkaline Phos 44 - 121 IU/L  103  69   AST 0 - 40 IU/L  17  18   ALT 0 - 32 IU/L  12  14    Lipid Panel     Component Value Date/Time   CHOL 203 (H) 06/12/2021 0806   TRIG 129 06/12/2021 0806   HDL 45 06/12/2021 0806   LDLCALC 135 (H) 06/12/2021 0806    CBC    Component Value Date/Time   WBC 6.3 07/29/2022 0819   RBC 4.25 07/29/2022 0819   HGB 10.0 (L) 07/29/2022 0819   HGB 11.2 05/03/2021 0955   HCT 32.8 (L) 07/29/2022 0819   HCT 37.8 05/03/2021 0955   PLT 248 07/29/2022 0819   PLT 276 05/03/2021 0955   MCV 77.2 (L) 07/29/2022 0819   MCV 80 05/03/2021 0955   MCH 23.5 (L)  07/29/2022 0819   MCHC 30.5 07/29/2022 0819   RDW 14.8 07/29/2022 0819   RDW 13.6 05/03/2021 0955   LYMPHSABS 1.4 07/29/2022 0819   MONOABS 0.6 07/29/2022 0819   EOSABS 0.0 07/29/2022 0819   BASOSABS 0.0 07/29/2022 0819    ASSESSMENT AND PLAN:  There are no diagnoses linked to this encounter.   Patient was given the opportunity to ask questions.  Patient verbalized understanding of the plan and was able to repeat key elements of the plan. Patient was given clear instructions to go to Emergency Department or return to medical center if symptoms don't improve, worsen, or new problems develop.The patient verbalized understanding.   No orders of the defined types were placed in this encounter.    Requested Prescriptions    No prescriptions requested or ordered in this encounter    No follow-ups on file.  Camillia Herter, NP

## 2022-10-07 ENCOUNTER — Ambulatory Visit: Payer: 59 | Admitting: Family

## 2022-10-07 DIAGNOSIS — T7840XD Allergy, unspecified, subsequent encounter: Secondary | ICD-10-CM

## 2022-10-07 NOTE — Progress Notes (Deleted)
Patient ID: Ashley Casey, female    DOB: [birth year]  MRN: [id number]  CC: Emergency Department Follow-Up  Subjective: Ashley Casey is a 41 y.o. female who presents for emergency department follow-up.   Her concerns today include:  10/01/2022 [facility] Emergency Department per MD note: Medical Decision Making 41 year old female presented following allergic reaction she had difficulty breathing along with nausea vomiting after coming in contact with oranges.  She accidentally injected stuck her EpiPen into her left thumb.  She received epinephrine along with p.o. Benadryl by EMS.  Symptoms have improved at this point.  I have ordered IV Medrol, IV Pepcid along with fluid bolus.  We will continue monitoring patient.  Given patient's reported severity of her reaction additional EPI was ordered as needed, this was given to patient.  She has been monitored since that time without adverse reaction.  No return of symptoms.  Patient is on cardiac monitor.   Amount and/or Complexity of Data Reviewed Radiology: ordered. Decision-making details documented in ED Course.   Risk Prescription drug management. Risk Details: Patient was observed for over 4 hours after her most recent dose of epinephrine.  She is well-appearing and in no acute distress she reports no recurrence of symptoms.  Patient is requesting discharge.  Patient's EpiPen was refilled today she was also given a prescription for prednisone and Benadryl to help with possible allergic reaction.  Patient has no wheezing on examination today but does report the albuterol inhaler given to her by EMS did help with her difficulty breathing she is requesting an albuterol Hailer to go home with we will place a prescription for her.  I asked the patient to check in with her primary care provider sometime within the next few days and I discussed strict ER precautions with the patient today.  All questions were answered.   As far as  patient's thumb she will closely observe this area if she develops any new pain or worsening symptoms she will return to the ER.  I advised to have this area rechecked with her primary care provider.  No evidence for open fracture or necrosis at this time.  Neurovascular intact on reevaluation.    At this time there does not appear to be any evidence of an acute emergency medical condition and the patient appears stable for discharge with appropriate outpatient follow up. Diagnosis was discussed with patient who verbalizes understanding of care plan and is agreeable to discharge. I have discussed return precautions with patient and who verbalizes understanding. Patient encouraged to follow-up with their PCP. All questions answered.   Patient's case discussed with Dr. Sherry Ruffing who agrees with plan to discharge with follow-up.    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  She received EpiPen by medics prior to arrival and was hemodynamically stable without any evidence of airway swelling or wheezing on arrival here.  She was given additional Solu-Medrol, Benadryl and Pepcid.  She accidentally self injected her EpiPen in her thumb and compartment syndrome or complication on her exam.  She was observed for 4 hours and discharged with primary care follow-up.  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 1 each 0   cyclobenzaprine (FLEXERIL) 10 MG tablet  Take 1 tablet (10 mg total) by mouth 2 (two) times daily as needed. (Patient taking differently: Take 10 mg by mouth 3 (three) times daily as needed for muscle spasms.) 20 tablet 0   diphenhydrAMINE (BENADRYL) 25 MG tablet Take 1 tablet (25 mg total) by mouth every 8 (eight) hours. 9 tablet 0   EPINEPHrine 0.3 mg/0.3 mL IJ SOAJ injection Inject 0.3 mg into the muscle as needed for anaphylaxis.     EPINEPHrine 0.3 mg/0.3 mL IJ SOAJ injection Inject 0.3 mg into the muscle as needed for anaphylaxis. 1 each 1   ibuprofen (ADVIL) 800 MG tablet Take 800 mg by mouth every 8 (eight) hours as needed for moderate pain.     Prenatal Vit-Fe Fumarate-FA (PRENATAL VITAMIN PO) Take 1 tablet by mouth daily.     [DISCONTINUED] clonazePAM (KLONOPIN) 0.5 MG tablet Take 1 tablet (0.5 mg total) by mouth 2 (two) times daily as needed for anxiety. 6 tablet 0   No current facility-administered medications on file prior to visit.    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     Tegretol [Carbamazepine] Other (See Comments)    Reaction:  Seizures     Social History   Socioeconomic History   Marital status: Married    Spouse name: Not on file   Number of children: Not on file   Years of education: Not on file   Highest education level: Not on file  Occupational History   Not on file  Tobacco Use   Smoking status: Never    Passive exposure: Never   Smokeless tobacco: Never  Vaping Use   Vaping Use: Never used  Substance and Sexual Activity   Alcohol use: No   Drug use: No   Sexual activity: Yes    Partners: Male    Birth control/protection: None  Other Topics Concern   Not on file  Social History Narrative   Not on file   Social Determinants of Health   Financial Resource Strain: Not on file  Food Insecurity: Not on file  Transportation Needs: Not on file  Physical Activity: Not on file  Stress: Not  on file  Social Connections: Not on file  Intimate Partner Violence: Not on file    Family History  Problem Relation Age of Onset   Other Mother        sickle cell anemia   Diabetes Mother    Sickle cell anemia Mother    Arthritis Brother    Asthma Brother    Diabetes Maternal Grandmother    Diabetes Paternal Grandmother     Past Surgical History:  Procedure Laterality Date   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  Physical Exam  {female adult master:310786} {female adult master:310785}     Latest Ref Rng & Units 07/29/2022    8:19 AM 05/03/2021    9:55 AM 02/24/2018  4:18 AM  CMP  Glucose 70 - 99 mg/dL 131  96  138   BUN 6 - 20 mg/dL '13  9  8   '$ Creatinine 0.44 - 1.00 mg/dL 0.76  0.66  0.45   Sodium 135 - 145 mmol/L 135  138  131   Potassium 3.5 - 5.1 mmol/L 3.2  3.9  2.8   Chloride 98 - 111 mmol/L 103  101  103   CO2 22 - 32 mmol/L '26  23  19   '$ Calcium 8.9 - 10.3 mg/dL 8.4  9.5  8.5   Total Protein 6.0 - 8.5 g/dL  8.4  7.3   Total Bilirubin 0.0 - 1.2 mg/dL  <0.2  1.0   Alkaline Phos 44 - 121 IU/L  103  69   AST 0 - 40 IU/L  17  18   ALT 0 - 32 IU/L  12  14    Lipid Panel     Component Value Date/Time   CHOL 203 (H) 06/12/2021 0806   TRIG 129 06/12/2021 0806   HDL 45 06/12/2021 0806   LDLCALC 135 (H) 06/12/2021 0806    CBC    Component Value Date/Time   WBC 6.3 07/29/2022 0819   RBC 4.25 07/29/2022 0819   HGB 10.0 (L) 07/29/2022 0819   HGB 11.2 05/03/2021 0955   HCT 32.8 (L) 07/29/2022 0819   HCT 37.8 05/03/2021 0955   PLT 248 07/29/2022 0819   PLT 276 05/03/2021 0955   MCV 77.2 (L) 07/29/2022 0819   MCV 80 05/03/2021 0955   MCH 23.5 (L) 07/29/2022 0819   MCHC 30.5 07/29/2022 0819   RDW 14.8 07/29/2022 0819   RDW 13.6 05/03/2021 0955   LYMPHSABS  1.4 07/29/2022 0819   MONOABS 0.6 07/29/2022 0819   EOSABS 0.0 07/29/2022 0819   BASOSABS 0.0 07/29/2022 0819    ASSESSMENT AND PLAN:  There are no diagnoses linked to this encounter.   Patient was given the opportunity to ask questions.  Patient verbalized understanding of the plan and was able to repeat key elements of the plan. Patient was given clear instructions to go to Emergency Department or return to medical center if symptoms don't improve, worsen, or new problems develop.The patient verbalized understanding.   No orders of the defined types were placed in this encounter.    Requested Prescriptions    No prescriptions requested or ordered in this encounter    No follow-ups on file.  Camillia Herter, NP

## 2022-10-08 NOTE — Progress Notes (Unsigned)
Patient ID: Ashley Casey, female    DOB: April 16, 1981  MRN: [id number]  CC: Emergency Department Follow-Up  Subjective: Ashley Casey is a 41 y.o. female who presents for emergency department follow-up.   Her concerns today include:  10/01/2022 [facility] Emergency Department per MD note: Medical Decision Making 40 year old female presented following allergic reaction she had difficulty breathing along with nausea vomiting after coming in contact with oranges.  She accidentally injected stuck her EpiPen into her left thumb.  She received epinephrine along with p.o. Benadryl by EMS.  Symptoms have improved at this point.  I have ordered IV Medrol, IV Pepcid along with fluid bolus.  We will continue monitoring patient.  Given patient's reported severity of her reaction additional EPI was ordered as needed, this was given to patient.  She has been monitored since that time without adverse reaction.  No return of symptoms.  Patient is on cardiac monitor.   Amount and/or Complexity of Data Reviewed Radiology: ordered. Decision-making details documented in ED Course.   Risk Prescription drug management. Risk Details: Patient was observed for over 4 hours after her most recent dose of epinephrine.  She is well-appearing and in no acute distress she reports no recurrence of symptoms.  Patient is requesting discharge.  Patient's EpiPen was refilled today she was also given a prescription for prednisone and Benadryl to help with possible allergic reaction.  Patient has no wheezing on examination today but does report the albuterol inhaler given to her by EMS did help with her difficulty breathing she is requesting an albuterol [place]ail[place] to go home with we will place a prescription for her.  I asked the patient to check in with her primary care provider sometime within the next few days and I discussed strict ER precautions with the patient today.  All questions were answered.   As far as  patient's thumb she will closely observe this area if she develops any new pain or worsening symptoms she will return to the ER.  I advised to have this area rechecked with her primary care provider.  No evidence for open fracture or necrosis at this time.  Neurovascular intact on reevaluation.    At this time there does not appear to be any evidence of an acute emergency medical condition and the patient appears stable for discharge with appropriate outpatient follow up. Diagnosis was discussed with patient who verbalizes understanding of care plan and is agreeable to discharge. I have discussed return precautions with patient and who verbalizes understanding. Patient encouraged to follow-up with their PCP. All questions answered.   Patient's case discussed with Dr. Sherry Ruffing who agrees with plan to discharge with follow-up.    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  She received EpiPen by medics prior to arrival and was hemodynamically stable without any evidence of airway swelling or wheezing on arrival here.  She was given additional Solu-Medrol, Benadryl and Pepcid.  She accidentally self injected her EpiPen in her thumb and compartment syndrome or complication on her exam.  She was observed for 4 hours and discharged with primary care follow-up.  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 1 each 0   cyclobenzaprine (FLEXERIL) 10 MG tablet  Take 1 tablet (10 mg total) by mouth 2 (two) times daily as needed. (Patient taking differently: Take 10 mg by mouth 3 (three) times daily as needed for muscle spasms.) 20 tablet 0   diphenhydrAMINE (BENADRYL) 25 MG tablet Take 1 tablet (25 mg total) by mouth every 8 (eight) hours. 9 tablet 0   EPINEPHrine 0.3 mg/0.3 mL IJ SOAJ injection Inject 0.3 mg into the muscle as needed for anaphylaxis.     EPINEPHrine 0.3 mg/0.3 mL IJ SOAJ injection Inject 0.3 mg into the muscle as needed for anaphylaxis. 1 each 1   ibuprofen (ADVIL) 800 MG tablet Take 800 mg by mouth every 8 (eight) hours as needed for moderate pain.     Prenatal Vit-Fe Fumarate-FA (PRENATAL VITAMIN PO) Take 1 tablet by mouth daily.     [DISCONTINUED] clonazePAM (KLONOPIN) 0.5 MG tablet Take 1 tablet (0.5 mg total) by mouth 2 (two) times daily as needed for anxiety. 6 tablet 0   No current facility-administered medications on file prior to visit.    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     Tegretol [Carbamazepine] Other (See Comments)    Reaction:  Seizures     Social History   Socioeconomic History   Marital status: Married    Spouse name: Not on file   Number of children: Not on file   Years of education: Not on file   Highest education level: Not on file  Occupational History   Not on file  Tobacco Use   Smoking status: Never    Passive exposure: Never   Smokeless tobacco: Never  Vaping Use   Vaping Use: Never used  Substance and Sexual Activity   Alcohol use: No   Drug use: No   Sexual activity: Yes    Partners: Male    Birth control/protection: None  Other Topics Concern   Not on file  Social History Narrative   Not on file   Social Determinants of Health   Financial Resource Strain: Not on file  Food Insecurity: Not on file  Transportation Needs: Not on file  Physical Activity: Not on file  Stress: Not  on file  Social Connections: Not on file  Intimate Partner Violence: Not on file    Family History  Problem Relation Age of Onset   Other Mother        sickle cell anemia   Diabetes Mother    Sickle cell anemia Mother    Arthritis Brother    Asthma Brother    Diabetes Maternal Grandmother    Diabetes Paternal Grandmother     Past Surgical History:  Procedure Laterality Date   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  Physical Exam  {female adult master:310786} {female adult master:310785}     Latest Ref Rng & Units 07/29/2022    8:19 AM 05/03/2021    9:55 AM 02/24/2018  4:18 AM  CMP  Glucose 70 - 99 mg/dL 131  96  138   BUN 6 - 20 mg/dL '13  9  8   '$ Creatinine 0.44 - 1.00 mg/dL 0.76  0.66  0.45   Sodium 135 - 145 mmol/L 135  138  131   Potassium 3.5 - 5.1 mmol/L 3.2  3.9  2.8   Chloride 98 - 111 mmol/L 103  101  103   CO2 22 - 32 mmol/L '26  23  19   '$ Calcium 8.9 - 10.3 mg/dL 8.4  9.5  8.5   Total Protein 6.0 - 8.5 g/dL  8.4  7.3   Total Bilirubin 0.0 - 1.2 mg/dL  <0.2  1.0   Alkaline Phos 44 - 121 IU/L  103  69   AST 0 - 40 IU/L  17  18   ALT 0 - 32 IU/L  12  14    Lipid Panel     Component Value Date/Time   CHOL 203 (H) 06/12/2021 0806   TRIG 129 06/12/2021 0806   HDL 45 06/12/2021 0806   LDLCALC 135 (H) 06/12/2021 0806    CBC    Component Value Date/Time   WBC 6.3 07/29/2022 0819   RBC 4.25 07/29/2022 0819   HGB 10.0 (L) 07/29/2022 0819   HGB 11.2 05/03/2021 0955   HCT 32.8 (L) 07/29/2022 0819   HCT 37.8 05/03/2021 0955   PLT 248 07/29/2022 0819   PLT 276 05/03/2021 0955   MCV 77.2 (L) 07/29/2022 0819   MCV 80 05/03/2021 0955   MCH 23.5 (L) 07/29/2022 0819   MCHC 30.5 07/29/2022 0819   RDW 14.8 07/29/2022 0819   RDW 13.6 05/03/2021 0955   LYMPHSABS  1.4 07/29/2022 0819   MONOABS 0.6 07/29/2022 0819   EOSABS 0.0 07/29/2022 0819   BASOSABS 0.0 07/29/2022 0819    ASSESSMENT AND PLAN:  There are no diagnoses linked to this encounter.   Patient was given the opportunity to ask questions.  Patient verbalized understanding of the plan and was able to repeat key elements of the plan. Patient was given clear instructions to go to Emergency Department or return to medical center if symptoms don't improve, worsen, or new problems develop.The patient verbalized understanding.   No orders of the defined types were placed in this encounter.    Requested Prescriptions    No prescriptions requested or ordered in this encounter    No follow-ups on file.  Camillia Herter, NP

## 2022-10-10 ENCOUNTER — Ambulatory Visit: Payer: 59 | Admitting: Family

## 2022-10-10 DIAGNOSIS — T7840XD Allergy, unspecified, subsequent encounter: Secondary | ICD-10-CM

## 2022-10-15 ENCOUNTER — Telehealth: Payer: Self-pay

## 2022-10-15 NOTE — Telephone Encounter (Signed)
Patient called in and stated that she was treated for Syphilis at the [facility] and was advised to f/u there in 4 mths. She received a called from one of our offices to be scheduled and wanted to know if she needed to be treated again. Advised pt no since she was already treated and to keep her f/u appt at the HD, pt agreed.

## 2022-10-16 ENCOUNTER — Encounter: Payer: Self-pay | Admitting: Family

## 2022-10-16 ENCOUNTER — Ambulatory Visit (INDEPENDENT_AMBULATORY_CARE_PROVIDER_SITE_OTHER): Payer: 59 | Admitting: Family

## 2022-10-16 VITALS — BP 120/88 | HR 85 | Temp 98.3°F | Resp 16 | Ht 64.02 in | Wt 180.0 lb

## 2022-10-16 DIAGNOSIS — T7840XD Allergy, unspecified, subsequent encounter: Secondary | ICD-10-CM | POA: Diagnosis not present

## 2022-10-16 DIAGNOSIS — R252 Cramp and spasm: Secondary | ICD-10-CM | POA: Insufficient documentation

## 2022-10-16 DIAGNOSIS — N946 Dysmenorrhea, unspecified: Secondary | ICD-10-CM

## 2022-10-16 DIAGNOSIS — N760 Acute vaginitis: Secondary | ICD-10-CM | POA: Insufficient documentation

## 2022-10-16 MED ORDER — IBUPROFEN 800 MG PO TABS: 800.0000 mg | ORAL_TABLET | Freq: Three times a day (TID) | ORAL | 2 refills | Status: DC | PRN

## 2022-10-16 NOTE — Patient Instructions (Signed)
Anaphylactic Reaction, Adult An anaphylactic reaction (anaphylaxis) is a sudden, severe allergic reaction by the body's disease-fighting system (immune system). Anaphylaxis can be life-threatening. This condition must be treated right away. Sometimes a person may need to be treated in the hospital. What are the causes? This condition is caused by exposure to a substance that you are allergic to (allergen). In response to this exposure, the body releases proteins (antibodies) and other compounds, such as histamine, into the bloodstream. This causes swelling in certain tissues and loss of blood pressure to important areas, such as the heart and lungs. Common allergens that can cause anaphylaxis include: Foods, especially peanuts, wheat, shellfish, milk, and eggs. Medicines. Insect bites or stings. Blood or parts of blood received for treatment (transfusions). Chemicals, such as dyes, latex, and contrast material that is used for medical tests. What increases the risk? This condition is more likely to occur in people who: Have allergies. Have had anaphylaxis before. Have a family history of anaphylaxis. Have certain medical conditions, including asthma and eczema. What are the signs or symptoms? Symptoms of anaphylaxis may include: Feeling warm in the face (flushed). This may include redness. Itchy, red, swollen areas of skin (hives). Swelling of the eyes, lips, face, mouth, tongue, or throat. Difficulty breathing, speaking, or swallowing. Dizziness, light-headedness, or fainting. Pain or cramping in the abdomen. Vomiting or diarrhea. How is this diagnosed? This condition is diagnosed based on: Your symptoms. A physical exam. Blood tests. Recent history of exposure to allergens. How is this treated? If you think you are having an anaphylactic reaction, you should do the following right away: Give yourself an epinephrine injection using what is commonly called an auto-injector "pen"  (pre-filled automatic epinephrine injection device). Your health care provider will teach you how to use an auto-injector pen. Call for emergency help. If you use a pen, you must still get emergency medical treatment in the hospital. Treatment in the hospital may include: Medicines to help: Tighten your blood vessels (epinephrine). Relieve itching and hives (antihistamines). Reduce swelling (corticosteroids). Oxygen therapy to help you breathe. IV fluids to keep you hydrated. Follow these instructions at home: Safety Always keep an auto-injector pen near you. This can be lifesaving if you have a severe anaphylactic reaction. Use your auto-injector pen as told by your health care provider. Do not drive after an anaphylactic reaction until your health care provider approves. Make sure that you, the members of your household, and your employer know: What you are allergic to, so it can be avoided. How to use an auto-injector pen to give you an epinephrine injection. Replace your epinephrine immediately after you use your auto-injector pen. This is important if you have another reaction. If possible, carry two epinephrine auto-injector pens. If told by your health care provider, wear a medical alert bracelet or necklace that states your allergy. Learn the signs of anaphylaxis so that you can recognize and treat it right away. Work with your health care providers to make an anaphylaxis plan. Preparation is important. General instructions Tell all your health care providers that you have an allergy. If you have hives or rash: Use an over-the-counter antihistamine as told by your health care provider. Apply cold, wet cloths (cold compresses) to your skin or take baths or showers in cool water. Avoid hot water. Take over-the-counter and prescription medicines only as told by your health care provider. Keep all follow-up visits as told by your health care provider. This is important. How is this  prevented? Avoid allergens  that have caused an anaphylactic reaction in the past. When you are at a restaurant, tell your server that you have an allergy. If you are not sure whether a menu item contains an ingredient that you are allergic to, ask your server. Where to find more information American Academy of Allergy, Asthma and Immunology: aaaai.org American Academy of Pediatrics: healthychildren.org Get help right away if: You develop symptoms of an allergic reaction. You may notice them soon after you are exposed to a substance. You used epinephrine. You need more medical care even if the medicine seems to be working. This is important because anaphylaxis may happen again within 72 hours (rebound anaphylaxis). You also may need more doses of epinephrine. These symptoms may represent a serious problem that is an emergency. Do not wait to see if the symptoms will go away. Do the following right away: Use the auto-injector pen as you have been instructed. Get medical help. Call your local emergency services (911 in the [place] Do not drive yourself to the hospital. Summary An anaphylactic reaction (anaphylaxis) is a sudden, severe allergic reaction by the body's disease-fighting system (immune system). This condition can be life-threatening. If you have an anaphylactic reaction, get medical help right away. Your health care provider may teach you how to use an auto-injector "pen" (pre-filled automatic epinephrine injection device) to give yourself a shot. Always keep an auto-injector pen with you. This could save your life. Use it as told by your health care provider. If you use epinephrine, you must still get emergency medical treatment, even if the medicine seems to be working. This information is not intended to replace advice given to you by your health care provider. Make sure you discuss any questions you have with your health care provider. Document Revised: 01/24/2021 Document Reviewed:  01/24/2021 Elsevier Patient Education  [employer].

## 2022-10-16 NOTE — Progress Notes (Signed)
Pt presents for follow-up from allergic reaction  -pt was in [employer] on 10[place]11 and store was juicing oranges and pt walked by that section when pt began to have a allergic reaction

## 2022-11-10 NOTE — Progress Notes (Signed)
Patient ID: Ashley Casey, female    DOB: 1981-08-29  MRN: [id number]  CC: Annual Physical Exam  Subjective: Ashley Casey is a 41 y.o. female who presents for annual physical exam.   Her concerns today include:  - Established with [facility] [employer] [facility]. Patient of Lynnda Shields, MD. She plans to schedule women's health maintenance appointment for pap and mammogram (due February 2024) soon. - Endorses vaginal itching. Denies additional symptoms. History of yeast infection. Period 3 days late. - Reports alopecia x 12 months. Was established with Dermatology and ready for referral back.   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 1 each 0   cyclobenzaprine (FLEXERIL) 10 MG tablet Take 1 tablet (10 mg total) by mouth 2 (two) times daily as needed. (Patient taking differently: Take 10 mg by mouth 3 (three) times daily as needed for muscle spasms.) 20 tablet 0   diphenhydrAMINE (BENADRYL) 25 MG tablet Take 1 tablet (25 mg total) by mouth every 8 (eight) hours. 9 tablet 0   EPINEPHrine 0.3 mg/0.3 mL IJ SOAJ injection Inject 0.3 mg into the muscle as needed for anaphylaxis. 1 each 1   ibuprofen (ADVIL) 800 MG tablet Take 1 tablet (800 mg total) by mouth every 8 (eight) hours as needed for moderate pain. 30 tablet 2   Prenatal Vit-Fe Fumarate-FA (PRENATAL VITAMIN PO)  Take 1 tablet by mouth daily.     [DISCONTINUED] clonazePAM (KLONOPIN) 0.5 MG tablet Take 1 tablet (0.5 mg total) by mouth 2 (two) times daily as needed for anxiety. 6 tablet 0   No current facility-administered medications on file prior to visit.    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     Tegretol [Carbamazepine] Other (See Comments)    Reaction:  Seizures     Social History   Socioeconomic History   Marital status: Married    Spouse name: Not on file   Number of children: Not on file   Years of education: Not on file   Highest education level: Not on file  Occupational History   Not on file  Tobacco Use   Smoking status: Never    Passive exposure: Never   Smokeless tobacco: Never  Vaping Use   Vaping Use: Never used  Substance and Sexual Activity   Alcohol use: No   Drug use: No   Sexual activity: Yes    Partners: Male    Birth control/protection: None  Other Topics Concern   Not on file  Social History Narrative   Not on file   Social Determinants of Health   Financial Resource Strain: Not on file  Food Insecurity:  Not on file  Transportation Needs: Not on file  Physical Activity: Not on file  Stress: Not on file  Social Connections: Not on file  Intimate Partner Violence: Not on file    Family History  Problem Relation Age of Onset   Other Mother        sickle cell anemia   Diabetes Mother    Sickle cell anemia Mother    Arthritis Brother    Asthma Brother    Diabetes Maternal Grandmother    Diabetes Paternal Grandmother     Past Surgical History:  Procedure Laterality Date   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     CHOLECYSTECTOMY  8/29/121   THERAPEUTIC ABORTION     WISDOM TOOTH EXTRACTION      ROS: Review of Systems Negative except  as stated above  PHYSICAL EXAM: BP 103/67 (BP Location: Left Arm, Patient Position: Sitting, Cuff Size: Large)   Pulse 82   Temp 98.3 F (36.8 C)   Resp 16   Ht 5' 4.02" (1.626 m)   Wt 180 lb (81.6 kg)   SpO2 98%   BMI 30.88 kg/m   Physical Exam HENT:     Head: Normocephalic and atraumatic.     Right Ear: Tympanic membrane, ear canal and external ear normal.     Left Ear: Tympanic membrane, ear canal and external ear normal.     Nose: Nose normal.     Mouth/Throat:     Mouth: Mucous membranes are moist.     Pharynx: Oropharynx is clear.  Eyes:     Extraocular Movements: Extraocular movements intact.     Conjunctiva/sclera: Conjunctivae normal.     Pupils: Pupils are equal, round, and reactive to light.  Cardiovascular:     Rate and Rhythm: Normal rate and regular rhythm.     Pulses: Normal pulses.     Heart sounds: Normal heart sounds.  Pulmonary:     Effort: Pulmonary effort is normal.     Breath sounds: Normal breath sounds.  Chest:     Comments: Patient declined.  Abdominal:     General: Bowel sounds are normal.     Palpations: Abdomen is soft.  Genitourinary:    Comments: Patient declined.  Musculoskeletal:        General: Normal range of motion.     Right shoulder: Normal.     Left shoulder: Normal.     Right upper arm: Normal.     Left upper arm: Normal.     Right elbow: Normal.     Left elbow: Normal.     Right forearm: Normal.     Left forearm: Normal.     Right wrist: Normal.     Left wrist: Normal.     Right hand: Normal.     Left hand: Normal.     Cervical back: Normal, normal range of motion and neck supple.     Thoracic back: Normal.     Lumbar back: Normal.     Right hip: Normal.     Left hip: Normal.     Right upper leg: Normal.     Left upper leg: Normal.     Right knee: Normal.     Left knee: Normal.     Right lower leg: Normal.     Left lower leg: Normal.     Right ankle: Normal.     Left ankle: Normal.     Right foot: Normal.      Left foot: Normal.  Skin:    General: Skin is warm and dry.     Capillary Refill: Capillary refill takes less than 2 seconds.  Neurological:     General: No focal deficit present.     Mental Status: She is alert and oriented to person, place, and time.  Psychiatric:        Mood and Affect: Mood normal.        Behavior: Behavior normal.    Results for orders placed or performed in visit on 11/17/22  POCT URINALYSIS DIP (CLINITEK)  Result Value Ref Range   Color, UA yellow yellow   Clarity, UA clear clear   Glucose, UA negative negative mg/dL   Bilirubin, UA negative negative   Ketones, POC UA negative negative mg/dL   Spec Grav, UA 1.025 1.010 - 1.025   Blood, UA negative negative   pH, UA 7.0 5.0 - 8.0   POC PROTEIN,UA trace negative, trace   Urobilinogen, UA 2.0 (A) 0.2 or 1.0 E.U./dL   Nitrite, UA Negative Negative   Leukocytes, UA Negative Negative  POCT urine pregnancy  Result Value Ref Range   Preg Test, Ur Negative Negative    ASSESSMENT AND PLAN: 1. Annual physical exam - Counseled on 150 minutes of exercise per week as tolerated, healthy eating (including decreased daily intake of saturated fats, cholesterol, added sugars, sodium), STI prevention, and routine healthcare maintenance.  2. Screening for metabolic disorder - Routine screening.  - Hepatic Function Panel  3. Screening for deficiency anemia - Routine screening.  - CBC  4. Diabetes mellitus screening 5. History of gestational diabetes mellitus - Routine screening.  - Hemoglobin A1c  6. Screening cholesterol level - Routine screening.  - Lipid panel  7. Thyroid disorder screen - Routine screening.  - TSH  8. Routine screening for STI (sexually transmitted infection) - Routine screening.  - Cervicovaginal ancillary only  9. Pregnancy test negative - Urine pregnancy negative.  - hCG serum result pending.  - POCT urine pregnancy - hCG, serum, qualitative  10. Vaginal itching - Empiric  treatment with Fluconazole for yeast infection. Counseled on medication adherence/adverse affects.  - No urinary tract infection. - POCT URINALYSIS DIP (CLINITEK) - fluconazole (DIFLUCAN) 150 MG tablet; Take 1 tablet (150 mg total) by mouth every 3 (three) days.  Dispense: 2 tablet; Refill: 0  11. Need for hepatitis C screening test - Routine screening.  - Hepatitis C Antibody  12. Alopecia - Referral to Dermatology for further evaluation/management.  - Ambulatory referral to Dermatology   Patient was given the opportunity to ask questions.  Patient verbalized understanding of the plan and was able to repeat key elements of the plan. Patient was given clear instructions to go to Emergency Department or return to medical center if symptoms don't improve, worsen, or new problems develop.The patient verbalized understanding.   Orders Placed This Encounter  Procedures   Hepatitis C Antibody   Lipid panel   Hemoglobin A1c   TSH   Hepatic Function Panel   CBC   hCG, serum, qualitative   Ambulatory referral to Dermatology   POCT URINALYSIS DIP (CLINITEK)   POCT urine pregnancy     Requested Prescriptions   Signed Prescriptions Disp Refills   fluconazole (DIFLUCAN) 150 MG tablet 2 tablet 0    Sig: Take 1 tablet (150 mg total) by mouth every 3 (three) days.    Return in about 1 year (around 11/18/2023) for Physical per patient preference.  Camillia Herter, NP

## 2022-11-17 ENCOUNTER — Other Ambulatory Visit (HOSPITAL_COMMUNITY)
Admission: RE | Admit: 2022-11-17 | Discharge: 2022-11-17 | Disposition: A | Discharge status: Home / Self Care | Payer: 59 | Source: Ambulatory Visit | Attending: Family | Admitting: Family

## 2022-11-17 ENCOUNTER — Encounter: Payer: Self-pay | Admitting: Family

## 2022-11-17 ENCOUNTER — Ambulatory Visit (INDEPENDENT_AMBULATORY_CARE_PROVIDER_SITE_OTHER): Payer: 59 | Admitting: Family

## 2022-11-17 VITALS — BP 103/67 | HR 82 | Temp 98.3°F | Resp 16 | Ht 64.02 in | Wt 180.0 lb

## 2022-11-17 DIAGNOSIS — Z13228 Encounter for screening for other metabolic disorders: Secondary | ICD-10-CM | POA: Diagnosis not present

## 2022-11-17 DIAGNOSIS — N898 Other specified noninflammatory disorders of vagina: Secondary | ICD-10-CM | POA: Insufficient documentation

## 2022-11-17 DIAGNOSIS — Z8632 Personal history of gestational diabetes: Secondary | ICD-10-CM

## 2022-11-17 DIAGNOSIS — Z113 Encounter for screening for infections with a predominantly sexual mode of transmission: Secondary | ICD-10-CM

## 2022-11-17 DIAGNOSIS — Z131 Encounter for screening for diabetes mellitus: Secondary | ICD-10-CM | POA: Diagnosis not present

## 2022-11-17 DIAGNOSIS — Z124 Encounter for screening for malignant neoplasm of cervix: Secondary | ICD-10-CM

## 2022-11-17 DIAGNOSIS — Z130 Encounter for screening for diseases of the blood and blood-forming organs and certain disorders involving the immune mechanism: Secondary | ICD-10-CM

## 2022-11-17 DIAGNOSIS — Z3202 Encounter for pregnancy test, result negative: Secondary | ICD-10-CM

## 2022-11-17 DIAGNOSIS — Z1329 Encounter for screening for other suspected endocrine disorder: Secondary | ICD-10-CM

## 2022-11-17 DIAGNOSIS — L659 Nonscarring hair loss, unspecified: Secondary | ICD-10-CM

## 2022-11-17 DIAGNOSIS — Z0000 Encounter for general adult medical examination without abnormal findings: Secondary | ICD-10-CM | POA: Diagnosis not present

## 2022-11-17 DIAGNOSIS — Z1159 Encounter for screening for other viral diseases: Secondary | ICD-10-CM

## 2022-11-17 DIAGNOSIS — Z13220 Encounter for screening for lipoid disorders: Secondary | ICD-10-CM

## 2022-11-17 LAB — POCT URINALYSIS DIP (CLINITEK)
Bilirubin, UA: NEGATIVE
Blood, UA: NEGATIVE
Glucose, UA: NEGATIVE mg/dL
Ketones, POC UA: NEGATIVE mg/dL
Leukocytes, UA: NEGATIVE
Nitrite, UA: NEGATIVE
Spec Grav, UA: 1.025 (ref 1.010–1.025)
Urobilinogen, UA: 2 E.U./dL — AB
pH, UA: 7 (ref 5.0–8.0)

## 2022-11-17 LAB — POCT URINE PREGNANCY: Preg Test, Ur: NEGATIVE

## 2022-11-17 MED ORDER — FLUCONAZOLE 150 MG PO TABS: 150.0000 mg | ORAL_TABLET | ORAL | 0 refills | Status: DC

## 2022-11-17 NOTE — Patient Instructions (Signed)
Preventive Care 43-43 Years Old, Female Preventive care refers to lifestyle choices and visits with your health care provider that can promote health and wellness. Preventive care visits are also called wellness exams. What can I expect for my preventive care visit? Counseling Your health care provider may ask you questions about your: Medical history, including: Past medical problems. Family medical history. Pregnancy history. Current health, including: Menstrual cycle. Method of birth control. Emotional well-being. Home life and relationship well-being. Sexual activity and sexual health. Lifestyle, including: Alcohol, nicotine or tobacco, and drug use. Access to firearms. Diet, exercise, and sleep habits. Work and work environment. Sunscreen use. Safety issues such as seatbelt and bike helmet use. Physical exam Your health care provider will check your: Height and weight. These may be used to calculate your BMI (body mass index). BMI is a measurement that tells if you are at a healthy weight. Waist circumference. This measures the distance around your waistline. This measurement also tells if you are at a healthy weight and may help predict your risk of certain diseases, such as type 2 diabetes and high blood pressure. Heart rate and blood pressure. Body temperature. Skin for abnormal spots. What immunizations do I need?  Vaccines are usually given at various ages, according to a schedule. Your health care provider will recommend vaccines for you based on your age, medical history, and lifestyle or other factors, such as travel or where you work. What tests do I need? Screening Your health care provider may recommend screening tests for certain conditions. This may include: Lipid and cholesterol levels. Diabetes screening. This is done by checking your blood sugar (glucose) after you have not eaten for a while (fasting). Pelvic exam and Pap test. Hepatitis B test. Hepatitis C  test. HIV (human immunodeficiency virus) test. STI (sexually transmitted infection) testing, if you are at risk. Lung cancer screening. Colorectal cancer screening. Mammogram. Talk with your health care provider about when you should start having regular mammograms. This may depend on whether you have a family history of breast cancer. BRCA-related cancer screening. This may be done if you have a family history of breast, ovarian, tubal, or peritoneal cancers. Bone density scan. This is done to screen for osteoporosis. Talk with your health care provider about your test results, treatment options, and if necessary, the need for more tests. Follow these instructions at home: Eating and drinking  Eat a diet that includes fresh fruits and vegetables, whole grains, lean protein, and low-fat dairy products. Take vitamin and mineral supplements as recommended by your health care provider. Do not drink alcohol if: Your health care provider tells you not to drink. You are pregnant, may be pregnant, or are planning to become pregnant. If you drink alcohol: Limit how much you have to 0-1 drink a day. Know how much alcohol is in your drink. In the [place] one drink equals one 12 oz bottle of beer (355 mL), one 5 oz glass of wine (148 mL), or one 1 oz glass of hard liquor (44 mL). Lifestyle Brush your teeth every morning and night with fluoride toothpaste. Floss one time each day. Exercise for at least 30 minutes 5 or more days each week. Do not use any products that contain nicotine or tobacco. These products include cigarettes, chewing tobacco, and vaping devices, such as e-cigarettes. If you need help quitting, ask your health care provider. Do not use drugs. If you are sexually active, practice safe sex. Use a condom or other form of protection to   prevent STIs. If you do not wish to become pregnant, use a form of birth control. If you plan to become pregnant, see your health care provider for a  prepregnancy visit. Take aspirin only as told by your health care provider. Make sure that you understand how much to take and what form to take. Work with your health care provider to find out whether it is safe and beneficial for you to take aspirin daily. Find healthy ways to manage stress, such as: Meditation, yoga, or listening to music. Journaling. Talking to a trusted person. Spending time with friends and family. Minimize exposure to UV radiation to reduce your risk of skin cancer. Safety Always wear your seat belt while driving or riding in a vehicle. Do not drive: If you have been drinking alcohol. Do not ride with someone who has been drinking. When you are tired or distracted. While texting. If you have been using any mind-altering substances or drugs. Wear a helmet and other protective equipment during sports activities. If you have firearms in your house, make sure you follow all gun safety procedures. Seek help if you have been physically or sexually abused. What's next? Visit your health care provider once a year for an annual wellness visit. Ask your health care provider how often you should have your eyes and teeth checked. Stay up to date on all vaccines. This information is not intended to replace advice given to you by your health care provider. Make sure you discuss any questions you have with your health care provider. Document Revised: 06/05/2021 Document Reviewed: 06/05/2021 Elsevier Patient Education  [place].

## 2022-11-17 NOTE — Progress Notes (Signed)
Pt presents for annual physical exam   -pt has gyn with [facility] Lynnda Shields, MD advised she needs to contact them to complete annual pap -desires STD screening and pregnancy test

## 2022-11-18 ENCOUNTER — Other Ambulatory Visit: Payer: Self-pay | Admitting: Family

## 2022-11-18 DIAGNOSIS — D509 Iron deficiency anemia, unspecified: Secondary | ICD-10-CM | POA: Insufficient documentation

## 2022-11-18 DIAGNOSIS — E119 Type 2 diabetes mellitus without complications: Secondary | ICD-10-CM | POA: Insufficient documentation

## 2022-11-18 LAB — TSH: TSH: 1.93 u[IU]/mL (ref 0.450–4.500)

## 2022-11-18 LAB — LIPID PANEL
Chol/HDL Ratio: 4.8 ratio — ABNORMAL HIGH (ref 0.0–4.4)
Cholesterol, Total: 201 mg/dL — ABNORMAL HIGH (ref 100–199)
HDL: 42 mg/dL (ref >39)
LDL Chol Calc (NIH): 141 mg/dL — ABNORMAL HIGH (ref 0–99)
Triglycerides: 101 mg/dL (ref 0–149)
VLDL Cholesterol Cal: 18 mg/dL (ref 5–40)

## 2022-11-18 LAB — CERVICOVAGINAL ANCILLARY ONLY
Bacterial Vaginitis (gardnerella): NEGATIVE
Candida Glabrata: NEGATIVE
Candida Vaginitis: POSITIVE — AB
Chlamydia: NEGATIVE
Comment: NEGATIVE
Comment: NEGATIVE
Comment: NEGATIVE
Comment: NEGATIVE
Comment: NEGATIVE
Comment: NORMAL
Neisseria Gonorrhea: NEGATIVE
Trichomonas: NEGATIVE

## 2022-11-18 LAB — HEPATIC FUNCTION PANEL
ALT: 12 IU/L (ref 0–32)
AST: 19 IU/L (ref 0–40)
Albumin: 4.2 g/dL (ref 3.9–4.9)
Alkaline Phosphatase: 95 IU/L (ref 44–121)
Bilirubin Total: 0.3 mg/dL (ref 0.0–1.2)
Bilirubin, Direct: <0.1 mg/dL (ref 0.00–0.40)
Total Protein: 7.7 g/dL (ref 6.0–8.5)

## 2022-11-18 LAB — HEPATITIS C ANTIBODY: Hep C Virus Ab: NONREACTIVE

## 2022-11-18 LAB — CBC
Hematocrit: 31.8 % — ABNORMAL LOW (ref 34.0–46.6)
Hemoglobin: 9.8 g/dL — ABNORMAL LOW (ref 11.1–15.9)
MCH: 22.5 pg — ABNORMAL LOW (ref 26.6–33.0)
MCHC: 30.8 g/dL — ABNORMAL LOW (ref 31.5–35.7)
MCV: 73 fL — ABNORMAL LOW (ref 79–97)
Platelets: 275 10*3/uL (ref 150–450)
RBC: 4.35 x10E6/uL (ref 3.77–5.28)
RDW: 15 % (ref 11.7–15.4)
WBC: 8.4 10*3/uL (ref 3.4–10.8)

## 2022-11-18 LAB — HEMOGLOBIN A1C
Est. average glucose Bld gHb Est-mCnc: 151 mg/dL
Hgb A1c MFr Bld: 6.9 % — ABNORMAL HIGH (ref 4.8–5.6)

## 2022-11-18 LAB — HCG, SERUM, QUALITATIVE: hCG,Beta Subunit,Qual,Serum: NEGATIVE m[IU]/mL (ref <6)

## 2022-11-18 MED ORDER — METFORMIN HCL ER 500 MG PO TB24: 500.0000 mg | ORAL_TABLET | Freq: Two times a day (BID) | ORAL | 0 refills | Status: DC

## 2022-11-18 MED ORDER — IRON (FERROUS SULFATE) 325 (65 FE) MG PO TABS: 325.0000 mg | ORAL_TABLET | Freq: Every day | ORAL | 2 refills | Status: DC

## 2022-11-26 ENCOUNTER — Encounter: Payer: Self-pay | Admitting: Family

## 2022-11-26 ENCOUNTER — Ambulatory Visit (INDEPENDENT_AMBULATORY_CARE_PROVIDER_SITE_OTHER): Payer: 59 | Admitting: Family

## 2022-11-26 VITALS — BP 129/88 | HR 74 | Temp 98.3°F | Resp 16 | Ht 64.02 in | Wt 180.0 lb

## 2022-11-26 DIAGNOSIS — R6884 Jaw pain: Secondary | ICD-10-CM

## 2022-11-26 MED ORDER — KETOROLAC TROMETHAMINE 60 MG/2ML IM SOLN
60.0000 mg | Freq: Once | INTRAMUSCULAR | Status: AC
  Administered 2022-11-26: 60 mg via INTRAMUSCULAR

## 2022-11-26 MED ORDER — TRIAMCINOLONE ACETONIDE 40 MG/ML IJ SUSP
40.0000 mg | Freq: Once | INTRAMUSCULAR | Status: AC
  Administered 2022-11-26: 40 mg via INTRAMUSCULAR

## 2022-11-26 MED ORDER — FLUCONAZOLE 150 MG PO TABS: 150.0000 mg | ORAL_TABLET | ORAL | 0 refills | Status: DC

## 2022-11-26 NOTE — Progress Notes (Unsigned)
Pt presents for jaw pain -pt states that she partied too hard, not sure what happened  -drunk too much alcohol, pt passed out woke up with scratch on her right side of face, chipped tooth(emergency visit to dentist to have repaired completed on Monday) face swollen, and extreme jaw pain -pt states not sure how she ended up on floor  -dentist states that she needs to see PCP because they don't think its tooth pain

## 2022-11-26 NOTE — Patient Instructions (Signed)
Jaw Contusion A jaw contusion is a deep bruise of the jaw. Contusions are the result of an injury to muscles and tissue under the skin. The injury causes bleeding under the skin. The skin over the contusion may turn blue, purple, or yellow. Minor injuries will cause a painless contusion, but more severe contusions may stay painful and swollen for a few weeks. What are the causes? This condition is usually caused by direct force or a hard hit to the jaw. What are the signs or symptoms? Symptoms of this condition include: Jaw pain. Jaw swelling. Jaw bruising, redness, or discoloration. Jaw tenderness or soreness. How is this diagnosed? This condition may be diagnosed based on: Your medical history. A physical exam. Imaging tests, such as: X-rays. CT scan. MRI. How is this treated? This condition may be treated by: Applying cold compresses to the injured area. Eating a soft-food diet. Taking over-the-counter medicines for pain. Follow these instructions at home: Eating and drinking  Eat soft foods as told by your health care provider. Soft foods include baby food, gelatin, oatmeal, ice cream, applesauce, bananas, eggs, pasta, cottage cheese, soups, and yogurt. Cut food into smaller pieces. This makes it easier to chew. Avoid chewing gum or ice. Managing pain, stiffness, and swelling  If directed, put ice on the injured area: Put ice in a plastic bag. Place a towel between your skin and the bag. Leave the ice on for 20 minutes, 2-3 times a day. General instructions Take over-the-counter and prescription medicines only as told by your health care provider. Avoid opening your mouth widely. This includes opening your mouth to eat large pieces of food or to yawn, scream, yell, or sing. Keep all follow-up visits as told by your health care provider. This is important. Contact a health care provider if: Your pain is not controlled with medicine. Your symptoms do not improve with  treatment or they get worse. You have new symptoms. You have any new cracking or clicking in your jaw. You have trouble eating or you cannot eat. Summary A jaw contusion is a deep bruise of the jaw. This condition is usually caused by direct force or a hard hit to the jaw. Symptoms include jaw pain, swelling, bruising, or redness. A jaw contusion may be treated by applying ice, eating soft foods, and taking over-the-counter pain medicines. This information is not intended to replace advice given to you by your health care provider. Make sure you discuss any questions you have with your health care provider. Document Revised: 10/07/2021 Document Reviewed: 10/07/2021 Elsevier Patient Education  [place].

## 2022-11-26 NOTE — Progress Notes (Unsigned)
Patient ID: Ashley Casey, female    DOB: [birth year]  MRN: [id number]  CC: No chief complaint on file.   Subjective: Ashley Casey is a 41 y.o. 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 1 each 0   cyclobenzaprine (FLEXERIL) 10 MG tablet Take 1 tablet (10 mg total) by mouth 2 (two) times daily as needed. (Patient taking differently: Take 10 mg by mouth 3 (three) times daily as needed for muscle spasms.) 20 tablet 0   diphenhydrAMINE (BENADRYL) 25 MG tablet Take 1 tablet (25 mg total) by mouth every 8 (eight) hours. 9 tablet 0   EPINEPHrine 0.3 mg/0.3 mL IJ SOAJ injection Inject 0.3 mg into the muscle as needed for anaphylaxis. 1 each 1   fluconazole (DIFLUCAN) 150 MG tablet Take 1 tablet (150 mg total) by mouth every 3 (three) days. 2 tablet 0   ibuprofen (ADVIL) 800 MG tablet Take 1 tablet (800 mg total) by mouth every 8 (eight) hours as needed for moderate pain. 30 tablet 2   Iron, Ferrous Sulfate, 325 (65 Fe) MG TABS Take 325 mg by mouth daily. 30 tablet 2   metFORMIN (GLUCOPHAGE-XR) 500 MG 24 hr tablet Take 1 tablet (500 mg total) by mouth 2 (two) times daily with a meal. 60 tablet 0    Prenatal Vit-Fe Fumarate-FA (PRENATAL VITAMIN PO) Take 1 tablet by mouth daily.     [DISCONTINUED] clonazePAM (KLONOPIN) 0.5 MG tablet Take 1 tablet (0.5 mg total) by mouth 2 (two) times daily as needed for anxiety. 6 tablet 0   No current facility-administered medications on file prior to visit.    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     Tegretol [Carbamazepine] Other (See Comments)    Reaction:  Seizures     Social History   Socioeconomic History   Marital status: Married    Spouse name: Not on file   Number of children: Not on file   Years of education: Not on file   Highest education level: Not on file  Occupational History   Not on file  Tobacco Use   Smoking status: Never    Passive exposure: Never   Smokeless tobacco: Never  Vaping Use   Vaping Use: Never used  Substance and Sexual Activity   Alcohol use: No   Drug use: No   Sexual activity: Yes    Partners: Male    Birth control/protection: None  Other Topics Concern  Not on file  Social History Narrative   Not on file   Social Determinants of Health   Financial Resource Strain: Not on file  Food Insecurity: Not on file  Transportation Needs: Not on file  Physical Activity: Not on file  Stress: Not on file  Social Connections: Not on file  Intimate Partner Violence: Not on file    Family History  Problem Relation Age of Onset   Other Mother        sickle cell anemia   Diabetes Mother    Sickle cell anemia Mother    Arthritis Brother    Asthma Brother    Diabetes Maternal Grandmother    Diabetes Paternal Grandmother     Past Surgical History:  Procedure Laterality Date   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     CHOLECYSTECTOMY  8/29/121   THERAPEUTIC ABORTION     WISDOM TOOTH EXTRACTION       ROS: Review of Systems Negative except as stated above  PHYSICAL EXAM: There were no vitals taken for this visit.  Physical Exam  {female adult master:310786} {female adult master:310785}     Latest Ref Rng & Units 11/17/2022    4:03 PM 07/29/2022    8:19 AM 05/03/2021    9:55 AM  CMP  Glucose 70 - 99 mg/dL  131  96   BUN 6 - 20 mg/dL  13  9   Creatinine 0.44 - 1.00 mg/dL  0.76  0.66   Sodium 135 - 145 mmol/L  135  138   Potassium 3.5 - 5.1 mmol/L  3.2  3.9   Chloride 98 - 111 mmol/L  103  101   CO2 22 - 32 mmol/L  26  23   Calcium 8.9 - 10.3 mg/dL  8.4  9.5   Total Protein 6.0 - 8.5 g/dL 7.7   8.4   Total Bilirubin 0.0 - 1.2 mg/dL 0.3   <0.2   Alkaline Phos 44 - 121 IU/L 95   103   AST 0 - 40 IU/L 19   17   ALT 0 - 32 IU/L 12   12    Lipid Panel     Component Value Date/Time   CHOL 201 (H) 11/17/2022 1603   TRIG 101 11/17/2022 1603   HDL 42 11/17/2022 1603   CHOLHDL 4.8 (H) 11/17/2022 1603   LDLCALC 141 (H) 11/17/2022 1603    CBC    Component Value Date/Time   WBC 8.4 11/17/2022 1603   WBC 6.3 07/29/2022 0819   RBC 4.35 11/17/2022 1603   RBC 4.25 07/29/2022 0819   HGB 9.8 (L) 11/17/2022 1603   HCT 31.8 (L) 11/17/2022 1603   PLT 275 11/17/2022 1603   MCV 73 (L) 11/17/2022 1603   MCH 22.5 (L) 11/17/2022 1603   MCH 23.5 (L) 07/29/2022 0819   MCHC 30.8 (L) 11/17/2022 1603   MCHC 30.5 07/29/2022 0819   RDW 15.0 11/17/2022 1603   LYMPHSABS 1.4 07/29/2022 0819   MONOABS 0.6 07/29/2022 0819   EOSABS 0.0 07/29/2022 0819   BASOSABS 0.0 07/29/2022 0819    ASSESSMENT AND PLAN:  There are no diagnoses linked to this encounter.   Patient was given the opportunity to ask questions.  Patient verbalized understanding of the plan and was able to repeat key elements of the plan. Patient was given clear instructions to go to Emergency Department or return to medical center if symptoms don't improve, worsen, or new problems develop.The patient verbalized  understanding.  No orders of the defined types were placed in this encounter.    Requested Prescriptions    No prescriptions requested or ordered in this encounter    No follow-ups on file.  Ashley Herter, NP

## 2022-12-01 ENCOUNTER — Encounter (HOSPITAL_BASED_OUTPATIENT_CLINIC_OR_DEPARTMENT_OTHER): Payer: Self-pay | Admitting: Emergency Medicine

## 2022-12-01 ENCOUNTER — Other Ambulatory Visit: Payer: Self-pay

## 2022-12-01 ENCOUNTER — Emergency Department (HOSPITAL_BASED_OUTPATIENT_CLINIC_OR_DEPARTMENT_OTHER)
Admission: EM | Admit: 2022-12-01 | Discharge: 2022-12-01 | Disposition: A | Discharge status: Home / Self Care | Payer: 59 | Attending: Emergency Medicine | Admitting: Emergency Medicine

## 2022-12-01 ENCOUNTER — Emergency Department (HOSPITAL_BASED_OUTPATIENT_CLINIC_OR_DEPARTMENT_OTHER): Payer: 59

## 2022-12-01 ENCOUNTER — Ambulatory Visit: Payer: 59 | Admitting: Family

## 2022-12-01 DIAGNOSIS — S0340XA Sprain of jaw, unspecified side, initial encounter: Secondary | ICD-10-CM | POA: Insufficient documentation

## 2022-12-01 DIAGNOSIS — W01198A Fall on same level from slipping, tripping and stumbling with subsequent striking against other object, initial encounter: Secondary | ICD-10-CM | POA: Insufficient documentation

## 2022-12-01 DIAGNOSIS — S0993XA Unspecified injury of face, initial encounter: Secondary | ICD-10-CM | POA: Diagnosis present

## 2022-12-01 MED ORDER — KETOROLAC TROMETHAMINE 15 MG/ML IJ SOLN
15.0000 mg | Freq: Once | INTRAMUSCULAR | Status: AC
  Administered 2022-12-01: 15 mg via INTRAMUSCULAR
  Filled 2022-12-01: qty 1

## 2022-12-01 MED ORDER — METHOCARBAMOL 500 MG PO TABS: 500.0000 mg | ORAL_TABLET | Freq: Three times a day (TID) | ORAL | 0 refills | Status: DC | PRN

## 2022-12-01 MED ORDER — METHOCARBAMOL 500 MG PO TABS
500.0000 mg | ORAL_TABLET | Freq: Once | ORAL | Status: AC
  Administered 2022-12-01: 500 mg via ORAL
  Filled 2022-12-01: qty 1

## 2022-12-01 NOTE — ED Provider Notes (Signed)
[place] EMERGENCY DEPARTMENT Provider Note   CSN: [id number] Arrival date & time: 12/01/22  5631     History  Chief Complaint  Patient presents with   Jaw Pain         Ashley Casey is a 41 y.o. female who presents with 10 day h/o L sided jaw pain. Pt states 10 days ago she drank heavily, woke up between a cough and table. Does not know if she fell and hit her head, however had a scratch on her cheek that morning. She saw her dentist, had a crown fixed. She saw her PCP and had a toradol shot and another "pain shot." She went back to her dentist who referred her to an oral Psychologist, sport and exercise. She states she continues to have pain and the TMJ on the L side, worse with movement, eating, yawning. She now hears a clicking sound. She does not have tooth pain, neck ;pain, pain in her cheek. No eye pain or ear pain.   HPI     Home Medications Prior to Admission medications   Medication Sig Start Date End Date Taking? Authorizing Provider  methocarbamol (ROBAXIN) 500 MG tablet Take 1 tablet (500 mg total) by mouth every 8 (eight) hours as needed for muscle spasms. 12/01/22  Yes Bradlee Bridgers, PA-C  albuterol (VENTOLIN HFA) 108 (90 Base) MCG/ACT inhaler Inhale 1-2 puffs into the lungs as needed for wheezing or shortness of breath. 10/01/22   Nuala Alpha A, PA-C  cyclobenzaprine (FLEXERIL) 10 MG tablet Take 1 tablet (10 mg total) by mouth 2 (two) times daily as needed. Patient taking differently: Take 10 mg by mouth 3 (three) times daily as needed for muscle spasms. 08/21/22   Raspet, Derry Skill, PA-C  diphenhydrAMINE (BENADRYL) 25 MG tablet Take 1 tablet (25 mg total) by mouth every 8 (eight) hours. 10/01/22   Nuala Alpha A, PA-C  EPINEPHrine 0.3 mg/0.3 mL IJ SOAJ injection Inject 0.3 mg into the muscle as needed for anaphylaxis. 10/01/22   Nuala Alpha A, PA-C  fluconazole (DIFLUCAN) 150 MG tablet Take 1 tablet (150 mg total) by mouth every 3 (three) days. 11/26/22    Camillia Herter, NP  ibuprofen (ADVIL) 800 MG tablet Take 1 tablet (800 mg total) by mouth every 8 (eight) hours as needed for moderate pain. 10/16/22   Camillia Herter, NP  Iron, Ferrous Sulfate, 325 (65 Fe) MG TABS Take 325 mg by mouth daily. 11/18/22   Camillia Herter, NP  metFORMIN (GLUCOPHAGE-XR) 500 MG 24 hr tablet Take 1 tablet (500 mg total) by mouth 2 (two) times daily with a meal. 11/18/22 12/18/22  Camillia Herter, NP  Prenatal Vit-Fe Fumarate-FA (PRENATAL VITAMIN PO) Take 1 tablet by mouth daily.    [provider]  clonazePAM (KLONOPIN) 0.5 MG tablet Take 1 tablet (0.5 mg total) by mouth 2 (two) times daily as needed for anxiety. 05/16/19 02/15/20  Ok Edwards, PA-C      Allergies    Food, Other, and Tegretol [carbamazepine]    Review of Systems   Review of Systems  HENT:  Positive for facial swelling.        L sided TMJ pain and swelling  All other systems reviewed and are negative.   Physical Exam Updated Vital Signs BP (!) 108/94 (BP Location: Right Arm)   Pulse 72   Temp 98.6 F (37 C) (Oral)   Resp 16   Ht '5\' 4"'$  (1.626 m)   Wt 83.9 kg  LMP 11/21/2022   SpO2 100%   BMI 31.76 kg/m  Physical Exam Vitals and nursing note reviewed.  Constitutional:      General: She is not in acute distress.    Appearance: Normal appearance.     Comments: nontoxic  HENT:     Head: Normocephalic.     Comments: Mild swelling over the L TMJ. Ttp of the TMJ. Trismus noted.  No ttp of the teeth. No ttp of the zygoma. TMs without hemotympanum bilaterally. Eyes:     Extraocular Movements: Extraocular movements intact.     Conjunctiva/sclera: Conjunctivae normal.     Pupils: Pupils are equal, round, and reactive to light.     Comments: EOMI and PERRL  Neck:     Comments: No ttp of the neck or midline c spine. Cardiovascular:     Rate and Rhythm: Normal rate.     Pulses: Normal pulses.  Pulmonary:     Effort: Pulmonary effort is normal.     Comments: Speaking in full  sentences.  Clear lung sounds in all fields. Abdominal:     General: There is no distension.  Musculoskeletal:        General: Normal range of motion.     Cervical back: Normal range of motion and neck supple.  Skin:    General: Skin is warm and dry.     Capillary Refill: Capillary refill takes less than 2 seconds.  Neurological:     Mental Status: She is alert and oriented to person, place, and time.  Psychiatric:        Mood and Affect: Mood and affect normal.        Speech: Speech normal.        Behavior: Behavior normal.     ED Results / Procedures / Treatments   Labs (all labs ordered are listed, but only abnormal results are displayed) Labs Reviewed - No data to display  EKG None  Radiology CT Maxillofacial Wo Contrast  Result Date: 12/01/2022 CLINICAL DATA:  Trauma. EXAM: CT MAXILLOFACIAL WITHOUT CONTRAST TECHNIQUE: Multidetector CT imaging of the maxillofacial structures was performed. Multiplanar CT image reconstructions were also generated. RADIATION DOSE REDUCTION: This exam was performed according to the departmental dose-optimization program which includes automated exposure control, adjustment of the mA and/or kV according to patient size and/or use of iterative reconstruction technique. COMPARISON:  None Available. FINDINGS: Osseous: No fracture or mandibular dislocation. No destructive process. Orbits: Negative. No traumatic or inflammatory finding. Sinuses: Trace mucosal thickening of the floor of bilateral maxillary sinuses. Soft tissues: Negative. Limited intracranial: No significant or unexpected finding. IMPRESSION: No fracture or dislocation of the facial bones. Electronically Signed   By: Marin Roberts M.D.   On: 12/01/2022 09:56    Procedures Procedures    Medications Ordered in ED Medications  methocarbamol (ROBAXIN) tablet 500 mg (500 mg Oral Given 12/01/22 0935)  ketorolac (TORADOL) 15 MG/ML injection 15 mg (15 mg Intramuscular Given 12/01/22 0254)     ED Course/ Medical Decision Making/ A&P                           Medical Decision Making Amount and/or Complexity of Data Reviewed Radiology: ordered.  Risk Prescription drug management.    This patient presents to the ED for concern of L jaw pain. This involves a number of treatment options, and is a complaint that carries with it a moderate risk of complications and morbidity.  The differential diagnosis  includes TMJD, fracture, MSK pain, dental injury   Co morbidities:  none   Additional history: Discussed prior evaluations for this condition and treatments tried so far   Imaging Studies:  I ordered imaging studies including CT maxillofacial  I independently visualized and interpreted imaging which showed no acute fracture or dislocation. No stranding or inflammation.  I agree with the radiologist interpretation   Medicines ordered:  I ordered medication including toradol and robaxin  for symptomatic pain control.  Reevaluation of the patient after these medicines showed that the patient improved I have reviewed the patients home medicines and have made adjustments as needed  Disposition:  After consideration of the diagnostic results and the patients response to treatment, I feel that the patent would benefit from outpatient management with symptom control. As there is no fracture or dislocation, most like inflammation/MSK cause of pain. Sxs are much improved after toradol and robaxin. Discussed findings and plan with pt. At this time, pt appears safe for d/c. Return precautions given. Pt states she understands and agrees to plan.          Final Clinical Impression(s) / ED Diagnoses Final diagnoses:  TMJ (sprain of temporomandibular joint), initial encounter    Rx / DC Orders ED Discharge Orders          Ordered    methocarbamol (ROBAXIN) 500 MG tablet  Every 8 hours PRN        12/01/22 1038              Dickens, [place], PA-C 12/01/22  1114    Long, Wonda Olds, MD 12/05/22 1625

## 2022-12-01 NOTE — Discharge Instructions (Signed)
You CT scan today did not show any sign of fracture or dislocation.   Take the 800 mg ibuprofen 3 times a day with meals.  Do not take other anti-inflammatories at the same time (Advil, Motrin, naproxen, Aleve). This will help with the inflammation of your jaw. You may supplement with Tylenol if you need further pain control. Use the robaxin up to 3 times a day as needed for pain Use ice packs for 20 minutes at a time, 3 times a day, for pain and swelling Follow up with your dentist/the oral surgeon as needed for continued pain I recommend you eat soft foods, and chew on the non-affected side, to minimize pain Return to the ER with any new, worsening, or concerning symptoms

## 2022-12-01 NOTE — ED Notes (Signed)
Patient transported to CT 

## 2022-12-01 NOTE — ED Triage Notes (Signed)
Patient presents C/O jaw pain since Dec 2nd. States it was her birthday weekend and "blacked out" and possibly fell landing on face. Went to dentist who referred her to ED for CT maxillofacial

## 2023-01-12 ENCOUNTER — Other Ambulatory Visit (HOSPITAL_COMMUNITY)
Admission: RE | Admit: 2023-01-12 | Discharge: 2023-01-12 | Disposition: A | Discharge status: Home / Self Care | Payer: 59 | Source: Ambulatory Visit | Attending: Obstetrics and Gynecology | Admitting: Obstetrics and Gynecology

## 2023-01-12 ENCOUNTER — Encounter: Payer: Self-pay | Admitting: Obstetrics and Gynecology

## 2023-01-12 ENCOUNTER — Ambulatory Visit (INDEPENDENT_AMBULATORY_CARE_PROVIDER_SITE_OTHER): Payer: 59 | Admitting: Obstetrics and Gynecology

## 2023-01-12 VITALS — BP 120/85 | HR 69 | Wt 181.0 lb

## 2023-01-12 DIAGNOSIS — N939 Abnormal uterine and vaginal bleeding, unspecified: Secondary | ICD-10-CM | POA: Insufficient documentation

## 2023-01-12 DIAGNOSIS — Z202 Contact with and (suspected) exposure to infections with a predominantly sexual mode of transmission: Secondary | ICD-10-CM | POA: Insufficient documentation

## 2023-01-12 DIAGNOSIS — Z01419 Encounter for gynecological examination (general) (routine) without abnormal findings: Secondary | ICD-10-CM | POA: Insufficient documentation

## 2023-01-12 DIAGNOSIS — D241 Benign neoplasm of right breast: Secondary | ICD-10-CM

## 2023-01-12 NOTE — Progress Notes (Signed)
Ashley Casey is a 42 y.o. [id number] female here for a routine annual gynecologic exam.  Current complaints: Had 2 cycles in Jan. Has not happened before. Reports some stress but otherwise no changes.   Denies discharge, pelvic pain, problems with intercourse or other gynecologic concerns.    Gynecologic History Patient's last menstrual period was 12/23/2022. Contraception: none Last Pap: Uncertain. Results were: normal Last mammogram: 2/23. Results were: abnormal  Obstetric History OB History  Gravida Para Term Preterm AB Living  '12 7 7   4 7  '$ SAB IAB Ectopic Multiple Live Births  3 1   0 7    # Outcome Date GA Lbr Len/2nd Weight Sex Delivery Anes PTL Lv  12 SAB 06/19/21 [redacted]w[redacted]d        11 Term 07/19/18 341w3d6:05 / 00:13 9 lb 8.7 oz (4.33 kg) M Vag-Spont EPI  LIV  10 Term 03/06/17 3826w3d00:10 9 lb 9 oz (4.338 kg) M Vag-Spont EPI  LIV     Birth Comments: WNL  9 Term 11/18/14 39w61w1d45 / 00:14 9 lb 2.4 oz (4.15 kg) M Vag-Spont EPI  LIV  8 Term 10/29/05 40w077w0db 4 oz (3.289 kg) F Vag-Spont None  LIV  7 Term 03/21/03 69w0d26w0d 3 oz (3.26 kg) F Vag-Spont None  LIV  6 SAB 2003          5 Term 09/04/97 [redacted]w[redacted]d 51w0d4 oz (3.289 kg) M Vag-Spont None  LIV  4 Gravida           3 SAB              Birth Comments: System Generated. Please review and update pregnancy details.  2 IAB              Birth Comments: TWINS  1 Term     M Vag-Spont   LIV    Past Medical History:  Diagnosis Date   Abnormal Pap smear    Anxiety    in past   Bell's palsy    after 5th pregnancy never resolved   Cholelithiasis    Fatigue    History of shingles    IBS (irritable bowel syndrome)    Neuromuscular disorder (HCC)     Past Surgical History:  Procedure Laterality Date   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     CHOLECYSTECTOMY  8/29/121   THERAPEUTIC ABORTION     WISDOM TOOTH EXTRACTION      Current Outpatient Medications on File Prior to Visit  Medication Sig Dispense Refill    albuterol (VENTOLIN HFA) 108 (90 Base) MCG/ACT inhaler Inhale 1-2 puffs into the lungs as needed for wheezing or shortness of breath. 1 each 0   EPINEPHrine 0.3 mg/0.3 mL IJ SOAJ injection Inject 0.3 mg into the muscle as needed for anaphylaxis. 1 each 1   ibuprofen (ADVIL) 800 MG tablet Take 1 tablet (800 mg total) by mouth every 8 (eight) hours as needed for moderate pain. 30 tablet 2   methocarbamol (ROBAXIN) 500 MG tablet Take 1 tablet (500 mg total) by mouth every 8 (eight) hours as needed for muscle spasms. 30 tablet 0   Prenatal Vit-Fe Fumarate-FA (PRENATAL VITAMIN PO) Take 1 tablet by mouth daily.     diphenhydrAMINE (BENADRYL) 25 MG tablet Take 1 tablet (25 mg total) by mouth every 8 (eight) hours. 9 tablet 0   fluconazole (DIFLUCAN) 150 MG tablet Take 1 tablet (150 mg total) by  mouth every 3 (three) days. 2 tablet 0   [DISCONTINUED] clonazePAM (KLONOPIN) 0.5 MG tablet Take 1 tablet (0.5 mg total) by mouth 2 (two) times daily as needed for anxiety. 6 tablet 0   No current facility-administered medications on file prior to visit.    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     Tegretol [Carbamazepine] Other (See Comments)    Reaction:  Seizures     Social History   Socioeconomic History   Marital status: Married    Spouse name: Not on file   Number of children: Not on file   Years of education: Not on file   Highest education level: Not on file  Occupational History   Not on file  Tobacco Use   Smoking status: Never    Passive exposure: Never   Smokeless tobacco: Never  Vaping Use   Vaping Use: Never used  Substance and Sexual Activity   Alcohol use: No   Drug use: No   Sexual activity: Yes    Partners: Male    Birth control/protection: None  Other Topics Concern    Not on file  Social History Narrative   Not on file   Social Determinants of Health   Financial Resource Strain: Not on file  Food Insecurity: Not on file  Transportation Needs: Not on file  Physical Activity: Not on file  Stress: Not on file  Social Connections: Not on file  Intimate Partner Violence: Not on file    Family History  Problem Relation Age of Onset   Other Mother        sickle cell anemia   Diabetes Mother    Sickle cell anemia Mother    Arthritis Brother    Asthma Brother    Diabetes Maternal Grandmother    Diabetes Paternal Grandmother     The following portions of the patient's history were reviewed and updated as appropriate: allergies, current medications, past family history, past medical history, past social history, past surgical history and problem list.  Review of Systems Pertinent items noted in HPI and remainder of comprehensive ROS otherwise negative.   Objective:  BP 120/85   Pulse 69   Wt 181 lb (82.1 kg)   LMP 12/23/2022   BMI 31.07 kg/m  Chaperone present CONSTITUTIONAL: Well-developed, well-nourished female in no acute distress.  HENT:  Normocephalic, atraumatic, External right and left ear normal. Oropharynx is clear and moist EYES: Conjunctivae and EOM are normal. Pupils are equal, round, and reactive to light. No scleral icterus.  NECK: Normal range of motion, supple, no masses.  Normal thyroid.  SKIN: Skin is warm and dry. No rash noted. Not diaphoretic. No erythema. No pallor. [place]: Alert and oriented to person, place, and time. Normal reflexes, muscle tone coordination. No cranial nerve deficit noted. PSYCHIATRIC: Normal mood and affect. Normal behavior. Normal judgment and thought content. CARDIOVASCULAR: Normal heart rate noted, regular rhythm RESPIRATORY: Clear to auscultation bilaterally. Effort and breath sounds normal, no problems with respiration noted. BREASTS: Symmetric in size. Fibroadenoma right breast noted, skin  changes, nipple drainage, or lymphadenopathy. ABDOMEN: Soft, normal bowel sounds, no distention noted.  No tenderness, rebound or guarding.  PELVIC: Normal appearing external genitalia; normal appearing vaginal mucosa and cervix.  No abnormal discharge  noted.  Pap smear obtained.  Normal uterine size, no other palpable masses, no uterine or adnexal tenderness. MUSCULOSKELETAL: Normal range of motion. No tenderness.  No cyanosis, clubbing, or edema.  2+ distal pulses.   Assessment:  Annual gynecologic examination with pap smear H/O + RPR Breast fibroadenoma AUB  Plan:  Will follow up results of pap smear and manage accordingly. Mammogram scheduled. Discussed potential causes of AUB. Suspect stress or hormonal in nature, will check labs and GYN U/S. Follow menstrual calendar for now.  Routine preventative health maintenance measures emphasized. Please refer to After Visit Summary for other counseling recommendations.    Chancy Milroy, MD, [facility] Attending [place] for [facility] [place]

## 2023-01-12 NOTE — Progress Notes (Signed)
Pt states had cycle 1/2-6.  Then started spotting last week and bleeding continued with pain/cramping that worsened.  Pt does not have any birth control. Pt has no new sexual partners.

## 2023-01-13 ENCOUNTER — Ambulatory Visit (INDEPENDENT_AMBULATORY_CARE_PROVIDER_SITE_OTHER): Payer: 59 | Admitting: Licensed Clinical Social Worker

## 2023-01-13 DIAGNOSIS — F439 Reaction to severe stress, unspecified: Secondary | ICD-10-CM | POA: Diagnosis not present

## 2023-01-13 LAB — CERVICOVAGINAL ANCILLARY ONLY
Bacterial Vaginitis (gardnerella): NEGATIVE
Candida Glabrata: NEGATIVE
Candida Vaginitis: NEGATIVE
Chlamydia: NEGATIVE
Comment: NEGATIVE
Comment: NEGATIVE
Comment: NEGATIVE
Comment: NEGATIVE
Comment: NEGATIVE
Comment: NORMAL
Neisseria Gonorrhea: NEGATIVE
Trichomonas: NEGATIVE

## 2023-01-13 NOTE — BH Specialist Note (Signed)
Integrated Behavioral Health Initial In-Person Visit  MRN: [id number] Name: Ashley Casey  Number of [place] Clinician visits: 1 Session Start time: 9:06am Session End time: 10:06am Total time in minutes: 53+ mins   Types of Service: Individual psychotherapy  Interpretor:No. Interpretor Name and Language: none   Warm Hand Off Completed.        Subjective: Ashley Casey is a 42 y.o. female accompanied by n/a Patient was referred by Dr Ashley Casey for anxiety. Patient reports the following symptoms/concerns: anxiety, limited support with parenting responsibilities, and stress  Duration of problem: over one year ; Severity of problem: mild  Objective: Mood: Good and Affect: Appropriate Risk of harm to self or others: No plan to harm self or others  Life Context: Family and Social: lives with spouse and six children  School/Work: [employer] 3rd shift  Self-Care: bowling  Life Changes: new grandchild   Patient and/or Family's Strengths/Protective Factors: Concrete supports in place (healthy food, safe environments, etc.)  Goals Addressed: Patient will: Reduce symptoms of: anxiety and stress Increase knowledge and/or ability of: coping skills and stress reduction  Demonstrate ability to: Increase adequate support systems for patient/family  Progress towards Goals: Ongoing  Interventions: Interventions utilized: Supportive Counseling and Communication Skills  Standardized Assessments completed: PHQ 9  Patient and/or Family Response: Ashley Casey reports increase stress and symptoms of anxiety to due limited support with parenting children at home with spouse. Ashley Casey reports feeling like a single parent due to spouse reluctance to help.   Assessment: Patient currently experiencing stress at home and anxiety.   Patient may benefit from integrated behavioral health.  Plan: Follow up with behavioral health clinician on : 2 weeks  Behavioral  recommendations: Create chore chart to delegate task, write down needs in prioritizing order to communicate with spouse, and engage in self care  Referral(s): [place] (In Clinic) "From scale of 1-10, how likely are you to follow plan?":    Ashley Ferrier, LCSW

## 2023-01-14 LAB — HIV ANTIBODY (ROUTINE TESTING W REFLEX): HIV Screen 4th Generation wRfx: NONREACTIVE

## 2023-01-14 LAB — CBC
Hematocrit: 33.6 % — ABNORMAL LOW (ref 34.0–46.6)
Hemoglobin: 9.9 g/dL — ABNORMAL LOW (ref 11.1–15.9)
MCH: 21.5 pg — ABNORMAL LOW (ref 26.6–33.0)
MCHC: 29.5 g/dL — ABNORMAL LOW (ref 31.5–35.7)
MCV: 73 fL — ABNORMAL LOW (ref 79–97)
Platelets: 249 10*3/uL (ref 150–450)
RBC: 4.6 x10E6/uL (ref 3.77–5.28)
RDW: 14.8 % (ref 11.7–15.4)
WBC: 7.5 10*3/uL (ref 3.4–10.8)

## 2023-01-14 LAB — HEPATITIS B SURFACE ANTIGEN: Hepatitis B Surface Ag: NEGATIVE

## 2023-01-14 LAB — RPR, QUANT+TP ABS (REFLEX)
Rapid Plasma Reagin, Quant: 1:4 {titer} — ABNORMAL HIGH
T Pallidum Abs: REACTIVE — AB

## 2023-01-14 LAB — HEPATITIS C ANTIBODY: Hep C Virus Ab: NONREACTIVE

## 2023-01-14 LAB — RPR: RPR Ser Ql: REACTIVE — AB

## 2023-01-14 LAB — TSH: TSH: 1.59 u[IU]/mL (ref 0.450–4.500)

## 2023-01-18 LAB — CYTOLOGY - PAP
Comment: NEGATIVE
Diagnosis: UNDETERMINED — AB
High risk HPV: NEGATIVE

## 2023-01-27 ENCOUNTER — Ambulatory Visit
Admission: RE | Admit: 2023-01-27 | Discharge: 2023-01-27 | Disposition: A | Discharge status: Home / Self Care | Payer: 59 | Source: Ambulatory Visit | Attending: Obstetrics and Gynecology | Admitting: Obstetrics and Gynecology

## 2023-01-27 ENCOUNTER — Ambulatory Visit: Payer: Medicaid Other | Admitting: Licensed Clinical Social Worker

## 2023-01-27 DIAGNOSIS — N939 Abnormal uterine and vaginal bleeding, unspecified: Secondary | ICD-10-CM

## 2023-02-06 ENCOUNTER — Other Ambulatory Visit: Payer: Self-pay | Admitting: Family

## 2023-02-06 DIAGNOSIS — N946 Dysmenorrhea, unspecified: Secondary | ICD-10-CM

## 2023-03-30 NOTE — Progress Notes (Signed)
Patient ID: Ashley Casey, female    DOB: 06/16/1981  MRN: [id number]  CC: Chronic Care Management   Subjective: Ashley Casey is a 42 y.o. female who presents for chronic care management.   Her concerns today include:  - Reports she is not taking Metformin due to she had side effects from the same during pregnancy. She would like to try another medication instead. She is trying to monitor what she eats. States she does enjoy eating "sweets".  - Increased stress due to work-life balance. Reports increased appetite and difficulty staying asleep at bedtime. 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 She denies thoughts of self-harm, suicidal ideations, homicidal ideations. - Requests medication refills on Albuterol inhaler for allergies, Ibuprofen for menstrual pain, and Methocarbamol for muscle spasms.  - Established with Gynecology.   Patient Active Problem List   Diagnosis Date Noted   Visit for routine gyn exam 01/12/2023   Exposure to sexually transmitted disease (STD) 01/12/2023   Abnormal uterine bleeding (AUB) 01/12/2023   Diabetes mellitus, type 2 11/18/2022   Iron deficiency anemia 11/18/2022   Spasm 10/16/2022   Vulvovaginitis 10/16/2022   Vulvar lesion 09/05/2022   Fibroadenoma of breast 09/05/2022   Prediabetes 06/12/2021   History of gestational diabetes mellitus 01/15/2018     Current Outpatient Medications on File Prior to Visit  Medication Sig Dispense Refill   EPINEPHrine 0.3 mg/0.3 mL IJ SOAJ injection Inject 0.3 mg into the muscle as needed for anaphylaxis. 1 each 1   Prenatal Vit-Fe Fumarate-FA (PRENATAL MULTIVITAMIN) TABS tablet Take 1 tablet by mouth daily at 12 noon.     [DISCONTINUED] clonazePAM (KLONOPIN) 0.5 MG tablet Take 1 tablet (0.5 mg total) by mouth 2 (two) times daily as needed for anxiety. 6 tablet 0   No current facility-administered medications on file prior to  visit.    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     Tegretol [Carbamazepine] Other (See Comments)    Reaction:  Seizures     Social History   Socioeconomic History   Marital status: Married    Spouse name: Not on file   Number of children: Not on file   Years of education: Not on file   Highest education level: Associate degree: academic program  Occupational History   Not on file  Tobacco Use   Smoking status: Never    Passive exposure: Never   Smokeless tobacco: Never  Vaping Use   Vaping Use: Never used  Substance and Sexual Activity   Alcohol use: No   Drug use: No   Sexual activity: Yes    Partners: Male    Birth control/protection: None  Other Topics Concern   Not on file  Social History Narrative   Not on file   Social Determinants of Health   Financial Resource Strain: Medium Risk (04/06/2023)   Overall Financial Resource Strain (CARDIA)    Difficulty of Paying Living Expenses: Somewhat hard  Food Insecurity: Food Insecurity Present (04/06/2023)   Hunger Vital Sign    Worried About Running Out of Food in the Last Year: Sometimes true    Ran Out of Food in the Last  Year: Sometimes true  Transportation Needs: No Transportation Needs (04/06/2023)   PRAPARE - Administrator, Civil ServiceTransportation    Lack of Transportation (Medical): No    Lack of Transportation (Non-Medical): No  Physical Activity: Sufficiently Active (04/06/2023)   Exercise Vital Sign    Days of Exercise per Week: 5 days    Minutes of Exercise per Session: 90 min  Stress: Stress Concern Present (04/06/2023)   [employer] - Occupational Stress Questionnaire    Feeling of Stress : Rather much  Social Connections: Moderately Isolated (04/06/2023)   Social Connection and Isolation Panel  [NHANES]    Frequency of Communication with Friends and Family: More than three times a week    Frequency of Social Gatherings with Friends and Family: Twice a week    Attends Religious Services: Never    Database administratorActive Member of Clubs or Organizations: No    Attends Engineer, structuralClub or Organization Meetings: Not on file    Marital Status: Married  Catering managerntimate Partner Violence: Not on file    Family History  Problem Relation Age of Onset   Other Mother        sickle cell anemia   Diabetes Mother    Sickle cell anemia Mother    Arthritis Brother    Asthma Brother    Diabetes Maternal Grandmother    Diabetes Paternal Grandmother     Past Surgical History:  Procedure Laterality Date   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     CHOLECYSTECTOMY  8/29/121   THERAPEUTIC ABORTION     WISDOM TOOTH EXTRACTION      ROS: Review of Systems Negative except as stated above  PHYSICAL EXAM: BP 106/72 (BP Location: Right Arm, Patient Position: Sitting, Cuff Size: Normal)   Pulse 76   Temp 98.1 F (36.7 C)   Resp 14   Ht 5\' 4"  (1.626 m)   Wt 186 lb 6.4 oz (84.6 kg)   LMP 03/31/2023   SpO2 99%   BMI 32.00 kg/m   Physical Exam HENT:     Head: Normocephalic and atraumatic.  Eyes:     Extraocular Movements: Extraocular movements intact.     Conjunctiva/sclera: Conjunctivae normal.     Pupils: Pupils are equal, round, and reactive to light.  Cardiovascular:     Rate and Rhythm: Normal rate and regular rhythm.     Pulses: Normal pulses.     Heart sounds: Normal heart sounds.  Pulmonary:     Effort: Pulmonary effort is normal.     Breath sounds: Normal breath sounds.  Musculoskeletal:     Cervical back: Normal range of motion and neck supple.  Neurological:     General: No focal deficit present.     Mental Status: She is alert and oriented to person, place, and time.  Psychiatric:        Mood and Affect: Mood normal.        Behavior: Behavior normal.    Results for orders placed or performed in  visit on 04/06/23  POCT glycosylated hemoglobin (Hb A1C)  Result Value Ref Range   Hemoglobin A1C     HbA1c POC (<> result, manual entry)     HbA1c, POC (prediabetic range) 6.0 5.7 - 6.4 %   HbA1c, POC (controlled diabetic range)      ASSESSMENT AND PLAN: 1. Type 2 diabetes mellitus without complication, without long-term current use of insulin - Hemoglobin A1c at goal at 6.0%, goal 7.0%. This is improved compared to previous 6.9%.  - Metformin  discontinued per patient preference.  - Sitaglitpin as prescribed. Counseled on medication adherence/adverse effects.  - Discussed the importance of healthy eating habits, low-carbohydrate diet, low-sugar diet, regular aerobic exercise (at least 150 minutes a week as tolerated) and medication compliance to achieve or maintain control of diabetes. - Follow-up with primary provider in 4 weeks or sooner if needed.  - POCT glycosylated hemoglobin (Hb A1C) - SITagliptin 25 MG TABS; Take 25 mg by mouth daily.  Dispense: 30 tablet; Refill: 0  2. Anxiety 3. Stress - Patient denies thoughts of self-harm, suicidal ideations, homicidal ideations. - Hydroxyzine as prescribed. Counseled on medication adherence/adverse effects. - Keep all scheduled appointments with counselor.  - Follow-up with primary provider in 4 weeks or sooner if needed.  - hydrOXYzine (VISTARIL) 25 MG capsule; Take 1 capsule (25 mg total) by mouth every 8 (eight) hours as needed.  Dispense: 30 capsule; Refill: 1  4. Perennial allergic rhinitis - Continue Albuterol inhaler as prescribed.  - Follow-up with primary provider as scheduled.  - albuterol (VENTOLIN HFA) 108 (90 Base) MCG/ACT inhaler; Inhale 1-2 puffs into the lungs every 6 (six) hours as needed for wheezing or shortness of breath.  Dispense: 8 g; Refill: 0  5. Dysmenorrhea - Continue Ibuprofen as prescribed.  - Keep all scheduled appointments with Gynecology.  - ibuprofen (ADVIL) 800 MG tablet; TAKE 1 TABLET(800 MG) BY  MOUTH EVERY 8 HOURS AS NEEDED FOR MODERATE PAIN  Dispense: 30 tablet; Refill: 2  6. Muscle pain - Continue Methocarbamol as prescribed.  - Follow-up with primary provider as scheduled.  - methocarbamol (ROBAXIN) 500 MG tablet; Take 1 tablet (500 mg total) by mouth every 8 (eight) hours as needed for muscle spasms.  Dispense: 30 tablet; Refill: 1   Patient was given the opportunity to ask questions.  Patient verbalized understanding of the plan and was able to repeat key elements of the plan. Patient was given clear instructions to go to Emergency Department or return to medical center if symptoms don't improve, worsen, or new problems develop.The patient verbalized understanding.   Orders Placed This Encounter  Procedures   POCT glycosylated hemoglobin (Hb A1C)     Requested Prescriptions   Signed Prescriptions Disp Refills   hydrOXYzine (VISTARIL) 25 MG capsule 30 capsule 1    Sig: Take 1 capsule (25 mg total) by mouth every 8 (eight) hours as needed.   SITagliptin 25 MG TABS 30 tablet 0    Sig: Take 25 mg by mouth daily.   albuterol (VENTOLIN HFA) 108 (90 Base) MCG/ACT inhaler 8 g 0    Sig: Inhale 1-2 puffs into the lungs every 6 (six) hours as needed for wheezing or shortness of breath.   ibuprofen (ADVIL) 800 MG tablet 30 tablet 2    Sig: TAKE 1 TABLET(800 MG) BY MOUTH EVERY 8 HOURS AS NEEDED FOR MODERATE PAIN   methocarbamol (ROBAXIN) 500 MG tablet 30 tablet 1    Sig: Take 1 tablet (500 mg total) by mouth every 8 (eight) hours as needed for muscle spasms.    Return in about 4 weeks (around 05/04/2023) for Follow-Up or next available anxiety/stress.  Rema Fendt, NP

## 2023-04-06 ENCOUNTER — Ambulatory Visit (INDEPENDENT_AMBULATORY_CARE_PROVIDER_SITE_OTHER): Payer: 59 | Admitting: Family

## 2023-04-06 VITALS — BP 106/72 | HR 76 | Temp 98.1°F | Resp 14 | Ht 64.0 in | Wt 186.4 lb

## 2023-04-06 DIAGNOSIS — E119 Type 2 diabetes mellitus without complications: Secondary | ICD-10-CM | POA: Diagnosis not present

## 2023-04-06 DIAGNOSIS — F439 Reaction to severe stress, unspecified: Secondary | ICD-10-CM | POA: Diagnosis not present

## 2023-04-06 DIAGNOSIS — M791 Myalgia, unspecified site: Secondary | ICD-10-CM

## 2023-04-06 DIAGNOSIS — J3089 Other allergic rhinitis: Secondary | ICD-10-CM

## 2023-04-06 DIAGNOSIS — F419 Anxiety disorder, unspecified: Secondary | ICD-10-CM

## 2023-04-06 DIAGNOSIS — D509 Iron deficiency anemia, unspecified: Secondary | ICD-10-CM

## 2023-04-06 DIAGNOSIS — N946 Dysmenorrhea, unspecified: Secondary | ICD-10-CM

## 2023-04-06 LAB — POCT GLYCOSYLATED HEMOGLOBIN (HGB A1C): HbA1c, POC (prediabetic range): 6 % (ref 5.7–6.4)

## 2023-04-06 MED ORDER — ALBUTEROL SULFATE HFA 108 (90 BASE) MCG/ACT IN AERS: 1.0000 | INHALATION_SPRAY | Freq: Four times a day (QID) | RESPIRATORY_TRACT | 0 refills | Status: AC | PRN

## 2023-04-06 MED ORDER — HYDROXYZINE PAMOATE 25 MG PO CAPS: 25.0000 mg | ORAL_CAPSULE | Freq: Three times a day (TID) | ORAL | 1 refills | Status: DC | PRN

## 2023-04-06 MED ORDER — SITAGLIPTIN 25 MG PO TABS: 25.0000 mg | ORAL_TABLET | Freq: Every day | ORAL | 0 refills | Status: DC

## 2023-04-06 MED ORDER — METHOCARBAMOL 500 MG PO TABS: 500.0000 mg | ORAL_TABLET | Freq: Three times a day (TID) | ORAL | 1 refills | Status: DC | PRN

## 2023-04-06 MED ORDER — IBUPROFEN 800 MG PO TABS: ORAL_TABLET | ORAL | 2 refills | Status: DC

## 2023-04-06 NOTE — Progress Notes (Signed)
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

## 2023-05-10 ENCOUNTER — Other Ambulatory Visit: Payer: Self-pay | Admitting: Family

## 2023-05-10 DIAGNOSIS — E119 Type 2 diabetes mellitus without complications: Secondary | ICD-10-CM

## 2023-05-12 ENCOUNTER — Ambulatory Visit (INDEPENDENT_AMBULATORY_CARE_PROVIDER_SITE_OTHER): Payer: 59 | Admitting: Dermatology

## 2023-05-12 VITALS — BP 111/73 | HR 77

## 2023-05-12 DIAGNOSIS — L668 Other cicatricial alopecia: Secondary | ICD-10-CM | POA: Diagnosis not present

## 2023-05-12 DIAGNOSIS — L219 Seborrheic dermatitis, unspecified: Secondary | ICD-10-CM

## 2023-05-12 DIAGNOSIS — R21 Rash and other nonspecific skin eruption: Secondary | ICD-10-CM

## 2023-05-12 MED ORDER — KETOCONAZOLE 2 % EX SHAM: MEDICATED_SHAMPOO | CUTANEOUS | 3 refills | Status: DC

## 2023-05-12 MED ORDER — FLUOCINOLONE ACETONIDE BODY 0.01 % EX OIL: TOPICAL_OIL | CUTANEOUS | 3 refills | Status: DC

## 2023-05-12 NOTE — Patient Instructions (Addendum)
Wound Care Instructions  Cleanse wound gently with soap and water once a day then pat dry with clean gauze. Apply a thin coat of Petrolatum (petroleum jelly, "Vaseline") over the wound (unless you have an allergy to this). We recommend that you use a new, sterile tube of Vaseline. Do not pick or remove scabs. Do not remove the yellow or white "healing tissue" from the base of the wound.  Cover the wound with fresh, clean, nonstick gauze and secure with paper tape. You may use Band-Aids in place of gauze and tape if the wound is small enough, but would recommend trimming much of the tape off as there is often too much. Sometimes Band-Aids can irritate the skin.  You should call the office for your biopsy report after 1 week if you have not already been contacted.  If you experience any problems, such as abnormal amounts of bleeding, swelling, significant bruising, significant pain, or evidence of infection, please call the office immediately.  FOR ADULT SURGERY PATIENTS: If you need something for pain relief you may take 1 extra strength Tylenol (acetaminophen) AND 2 Ibuprofen (200mg each) together every 4 hours as needed for pain. (do not take these if you are allergic to them or if you have a reason you should not take them.) Typically, you may only need pain medication for 1 to 3 days.     Due to recent changes in healthcare laws, you may see results of your pathology and/or laboratory studies on MyChart before the doctors have had a chance to review them. We understand that in some cases there may be results that are confusing or concerning to you. Please understand that not all results are received at the same time and often the doctors may need to interpret multiple results in order to provide you with the best plan of care or course of treatment. Therefore, we ask that you please give us 2 business days to thoroughly review all your results before contacting the office for clarification. Should  we see a critical lab result, you will be contacted sooner.   If You Need Anything After Your Visit  If you have any questions or concerns for your doctor, please call our main line at [phone] and press option 4 to reach your doctor's medical assistant. If no one answers, please leave a voicemail as directed and we will return your call as soon as possible. Messages left after 4 pm will be answered the following business day.   You may also send us a message via MyChart. We typically respond to MyChart messages within 1-2 business days.  For prescription refills, please ask your pharmacy to contact our office. Our fax number is [phone].  If you have an urgent issue when the clinic is closed that cannot wait until the next business day, you can page your doctor at the number below.    Please note that while we do our best to be available for urgent issues outside of office hours, we are not available 24/7.   If you have an urgent issue and are unable to reach us, you may choose to seek medical care at your doctor's office, retail clinic, urgent care center, or emergency room.  If you have a medical emergency, please immediately call 911 or go to the emergency department.  Pager Numbers  - Dr. Kowalski: [phone]  - Dr. Moye: [phone]  - Dr. Stewart: [phone]  In the event of inclement weather, please call our main line at   [phone] for an update on the status of any delays or closures.  Dermatology Medication Tips: Please keep the boxes that topical medications come in in order to help keep track of the instructions about where and how to use these. Pharmacies typically print the medication instructions only on the boxes and not directly on the medication tubes.   If your medication is too expensive, please contact our office at [phone] option 4 or send us a message through MyChart.   We are unable to tell what your co-pay for medications will be in  advance as this is different depending on your insurance coverage. However, we may be able to find a substitute medication at lower cost or fill out paperwork to get insurance to cover a needed medication.   If a prior authorization is required to get your medication covered by your insurance company, please allow us 1-2 business days to complete this process.  Drug prices often vary depending on where the prescription is filled and some pharmacies may offer cheaper prices.  The website www.goodrx.com contains coupons for medications through different pharmacies. The prices here do not account for what the cost may be with help from insurance (it may be cheaper with your insurance), but the website can give you the price if you did not use any insurance.  - You can print the associated coupon and take it with your prescription to the pharmacy.  - You may also stop by our office during regular business hours and pick up a GoodRx coupon card.  - If you need your prescription sent electronically to a different pharmacy, notify our office through [facility] MyChart or by phone at [phone] option 4.     Si Usted Necesita Algo Despus de Su Visita  Tambin puede enviarnos un mensaje a travs de MyChart. Por lo general respondemos a los mensajes de MyChart en el transcurso de 1 a 2 das hbiles.  Para renovar recetas, por favor pida a su farmacia que se ponga en contacto con nuestra oficina. Nuestro nmero de fax es el [phone].  Si tiene un asunto urgente cuando la clnica est cerrada y que no puede esperar hasta el siguiente da hbil, puede llamar/localizar a su doctor(a) al nmero que aparece a continuacin.   Por favor, tenga en cuenta que aunque hacemos todo lo posible para estar disponibles para asuntos urgentes fuera del horario de oficina, no estamos disponibles las 24 horas del da, los 7 das de la semana.   Si tiene un problema urgente y no puede comunicarse con nosotros, puede  optar por buscar atencin mdica  en el consultorio de su doctor(a), en una clnica privada, en un centro de atencin urgente o en una sala de emergencias.  Si tiene una emergencia mdica, por favor llame inmediatamente al 911 o vaya a la sala de emergencias.  Nmeros de bper  - Dr. Kowalski: [phone]  - Dra. Moye: [phone]  - Dra. Stewart: [phone]  En caso de inclemencias del tiempo, por favor llame a nuestra lnea principal al [phone] para una actualizacin sobre el estado de cualquier retraso o cierre.  Consejos para la medicacin en dermatologa: Por favor, guarde las cajas en las que vienen los medicamentos de uso tpico para ayudarle a seguir las instrucciones sobre dnde y cmo usarlos. Las farmacias generalmente imprimen las instrucciones del medicamento slo en las cajas y no directamente en los tubos del medicamento.   Si su medicamento es muy caro, por favor, pngase en contacto con   nuestra oficina llamando al [phone] y presione la opcin 4 o envenos un mensaje a travs de MyChart.   No podemos decirle cul ser su copago por los medicamentos por adelantado ya que esto es diferente dependiendo de la cobertura de su seguro. Sin embargo, es posible que podamos encontrar un medicamento sustituto a menor costo o llenar un formulario para que el seguro cubra el medicamento que se considera necesario.   Si se requiere una autorizacin previa para que su compaa de seguros cubra su medicamento, por favor permtanos de 1 a 2 das hbiles para completar este proceso.  Los precios de los medicamentos varan con frecuencia dependiendo del lugar de dnde se surte la receta y alguna farmacias pueden ofrecer precios ms baratos.  El sitio web www.goodrx.com tiene cupones para medicamentos de diferentes farmacias. Los precios aqu no tienen en cuenta lo que podra costar con la ayuda del seguro (puede ser ms barato con su seguro), pero el sitio web puede darle el  precio si no utiliz ningn seguro.  - Puede imprimir el cupn correspondiente y llevarlo con su receta a la farmacia.  - Tambin puede pasar por nuestra oficina durante el horario de atencin regular y recoger una tarjeta de cupones de GoodRx.  - Si necesita que su receta se enve electrnicamente a una farmacia diferente, informe a nuestra oficina a travs de MyChart de [facility] o por telfono llamando al [phone] y presione la opcin 4.  

## 2023-05-12 NOTE — Progress Notes (Signed)
New Patient Visit   Subjective  Ashley Casey is a 42 y.o. female who presents for the following: hair loss that started 2 years ago. She has tried Rogaine, but that gave her headaches. She has also tried medicated shampoos and oils. No excessive itching of the scalp. Hair loss does not run in the family. Patient had long dreadlocks prior to hair loss. No relaxers in over 10 years. No biopsy in the past. Patient saw a hair specialist in [facility], [address]. Also a specialist in [place]. Both gave her medicated shampoos.  Didn't help.    The following portions of the chart were reviewed this encounter and updated as appropriate: medications, allergies, medical history  Review of Systems:  No other skin or systemic complaints except as noted in HPI or Assessment and Plan.  Objective  Well appearing patient in no apparent distress; mood and affect are within normal limits.  A focused examination was performed of the following areas: Face, scalp  Relevant exam findings are noted in the Assessment and Plan.  Left parietal scalp; Right crown  Right crown Alopecia with loss of follicle of the mid crown; focal areas of hair thinning of the scalp with follicular prominence; decreased hair growth on frontal/temporal hairline.   Left parietal scalp Alopecia with loss of follicle of the mid crown; focal areas of hair thinning of the scalp with follicular prominence; decreased hair growth on frontal/temporal hairline.     Assessment & Plan     Rash and other nonspecific skin eruption Left parietal scalp; Right crown  Alopecia scalp, suspicious for scarring alopecia- CCCA, LPP vrs other   3.68mm punch biopsies x 2. Right crown - horizontal sectioning; left parietal scalp - vertical sectioning.   Take baseline photos next visit  Skin / nail biopsy - Right crown Type of biopsy: punch   Informed consent: discussed and consent obtained   Patient was prepped and draped in  usual sterile fashion: Area prepped with alcohol. Anesthesia: the lesion was anesthetized in a standard fashion   Anesthetic:  1% lidocaine w/ epinephrine 1-100,000 buffered w/ 8.4% NaHCO3 Punch size:  3.5 mm Suture removal (days):  7 Hemostasis achieved with: pressure and Gelfoam   Outcome: patient tolerated procedure well   Post-procedure details: wound care instructions given   Post-procedure details comment:  Ointment and pressure dressing applied  Skin / nail biopsy - Left parietal scalp Type of biopsy: punch   Informed consent: discussed and consent obtained   Patient was prepped and draped in usual sterile fashion: Area prepped with alcohol. Anesthesia: the lesion was anesthetized in a standard fashion   Anesthetic:  1% lidocaine w/ epinephrine 1-100,000 buffered w/ 8.4% NaHCO3 Punch size:  3.5 mm Suture removal (days):  7 Hemostasis achieved with: pressure and Gelfoam   Outcome: patient tolerated procedure well   Post-procedure details: wound care instructions given   Post-procedure details comment:  Ointment and pressure dressing applied  Specimen 1 - Surgical pathology Differential Diagnosis: Cicatricial Alopecia (CCCA vs LPP) vs Androgenetic Alopecia Check Margins: No Horizontal sectioning 3.5 mm punch  Specimen 2 - Surgical pathology Differential Diagnosis: Cicatricial Alopecia (CCCA vs LPP) vs Androgenetic Alopecia Check Margins: No Vertical sectioning. 3.5 mm punch  SEBORRHEIC DERMATITIS Exam: Pink patches with greasy scale at scalp  Chronic and persistent condition with duration or expected duration over one year. Condition is symptomatic/ bothersome to patient. Not currently at goal.   Seborrheic Dermatitis is a chronic persistent rash characterized by pinkness  and scaling most commonly of the mid face but also can occur on the scalp (dandruff), ears; mid chest, mid back and groin.  It tends to be exacerbated by stress and cooler weather.  People who have  neurologic disease may experience new onset or exacerbation of existing seborrheic dermatitis.  The condition is not curable but treatable and can be controlled.  Treatment Plan: Start 2% ketoconazole shampoo massage into scalp and let sit several minutes before rinsing 3Rf.  Start Derma-Smoothe F/S oil apply to scalp QD/BID 3Rf.  Return in about 1 week (around 05/19/2023) for bx follow-up/suture removal.  I, Cherlyn Labella, CMA, am acting as scribe for Willeen Niece, MD .   Documentation: I have reviewed the above documentation for accuracy and completeness, and I agree with the above.  Willeen Niece, MD

## 2023-05-12 NOTE — Telephone Encounter (Signed)
Requested Prescriptions  Pending Prescriptions Disp Refills   JANUVIA 25 MG tablet [Pharmacy Med Name: JANUVIA 25MG  TABLETS] 30 tablet 2    Sig: TAKE 1 TABLET BY MOUTH EVERY DAY     Endocrinology:  Diabetes - DPP-4 Inhibitors Passed - 05/10/2023 10:38 AM      Passed - HBA1C is between 0 and 7.9 and within 180 days    HbA1c, POC (prediabetic range)  Date Value Ref Range Status  04/06/2023 6.0 5.7 - 6.4 % Final         Passed - Cr in normal range and within 360 days    Creatinine, Ser  Date Value Ref Range Status  07/29/2022 0.76 0.44 - 1.00 mg/dL Final         Passed - Valid encounter within last 6 months    Recent Outpatient Visits           1 month ago Type 2 diabetes mellitus without complication, without long-term current use of insulin (HCC)   [facility], [place], NP   5 months ago Jaw pain   [facility], [place], [place]   5 months ago Annual physical exam   [facility], Amy J, NP   6 months ago Allergic reaction, subsequent encounter   [facility], Amy J, NP   10 months ago Encounter to establish care   [facility], Salomon Fick, NP       Future Appointments             Today Willeen Niece, MD [facility]

## 2023-05-19 ENCOUNTER — Ambulatory Visit (INDEPENDENT_AMBULATORY_CARE_PROVIDER_SITE_OTHER): Payer: 59 | Admitting: Dermatology

## 2023-05-19 VITALS — BP 117/81 | HR 69

## 2023-05-19 DIAGNOSIS — S0100XA Unspecified open wound of scalp, initial encounter: Secondary | ICD-10-CM

## 2023-05-19 DIAGNOSIS — T148XXA Other injury of unspecified body region, initial encounter: Secondary | ICD-10-CM

## 2023-05-19 DIAGNOSIS — L668 Other cicatricial alopecia: Secondary | ICD-10-CM

## 2023-05-19 DIAGNOSIS — R208 Other disturbances of skin sensation: Secondary | ICD-10-CM

## 2023-05-19 MED ORDER — MUPIROCIN 2 % EX OINT: 1.0000 | TOPICAL_OINTMENT | Freq: Every day | CUTANEOUS | 0 refills | Status: DC

## 2023-05-19 MED ORDER — DOXYCYCLINE MONOHYDRATE 100 MG PO CAPS: 100.0000 mg | ORAL_CAPSULE | Freq: Two times a day (BID) | ORAL | 0 refills | Status: AC

## 2023-05-19 MED ORDER — FLUCONAZOLE 150 MG PO TABS: 150.0000 mg | ORAL_TABLET | Freq: Once | ORAL | 1 refills | Status: AC

## 2023-05-19 NOTE — Progress Notes (Signed)
Follow-Up Visit   Subjective  Ashley Casey is a 42 y.o. female who presents for the following: for follow up on scalp biopsies. Path confirms CCCA.  Pt denies any hair relaxers or braiding/weaves, but did do those in the past.  Patient reports she has been having a lot of pain in areas where biopsies were removed. Has tried multiple over the counter treatments to help with pain. Would like to discuss.    The following portions of the chart were reviewed this encounter and updated as appropriate: medications, allergies, medical history  Review of Systems:  No other skin or systemic complaints except as noted in HPI or Assessment and Plan.  Objective  Well appearing patient in no apparent distress; mood and affect are within normal limits.  A focused examination was performed of the following areas: scalp  Relevant exam findings are noted in the Assessment and Plan.  Scalp Right crown- crusted biopsy site, Left parietal scalp- crusted biopsy site, tender to touch  Alopecia with loss of follicle of the mid crown; focal areas of hair thinning of the scalp with follicular prominence; decreased hair growth on frontal/temporal hairline.     left parietal scalp, right crown crusted biopsy site, tender to touch     Assessment & Plan     Central centrifugal scarring alopecia Scalp  Bx-proven.  Chronic and persistent condition with duration or expected duration over one year. Condition is bothersome/symptomatic for patient. Currently flared.   CCCA is a chronic/progressive and irreversible patterned form of scarring alopecia that most commonly affects middle-aged women of African descent that presents with progressive hair loss, starting as a single patch at the vertex of the scalp and then expanding in a centrifugal and symmetrical pattern on the crown. Triggering or aggravation of the disease may occur following traumatic hair care practices, such as cornrows and braiding,  extensions, weaves with sewn-in or glued-on hair, use of hot combs, and frequent use of hair relaxers. These practices should be discontinued to slow progression of disease. Therapies may stop or slow progression but generally do not lead to hair regrowth in scarred areas.    Discussed treatments including topical and IL steroids, oral long term doxycycline, Topical/oral minoxidil.  Patient declines treatments at this time. Pt prefers to shave head and wear a wig.  Pt will continue 2% ketoconazole shampoo and DermaSmoothe Oil to scalp (for Seb Derm) as previously prescribed.    Open wound  Related Medications doxycycline (MONODOX) 100 MG capsule Take 1 capsule (100 mg total) by mouth 2 (two) times daily for 14 days. Take with food and drink  mupirocin ointment (BACTROBAN) 2 % Apply 1 Application topically daily. Apply topically to wound  Skin pain left parietal scalp, right crown  Post scalp biopsy x 2  Reports sites are very painful after biopsies L>R  Start doxycycline 100 mg capsule - take 1 capsule by mouth twice daily with food for 2 weeks in case of underlying infection/abscess  Send in fluconazole 150 mg PO qd x 1 prn yeast infection, 1 rf  Doxycycline should be taken with food to prevent nausea. Do not lay down for 30 minutes after taking. Be cautious with sun exposure and use good sun protection while on this medication. Pregnant women should not take this medication.   Start mupirocin ointment - apply to affected areas twice daily until healed.   Recommend over the counter Voltaren Gel or Lidocaine 4 % cream apply to aa's twice daily as needed  for pain  May also continue taking OTC tylenol or ibuprofen (caution when combining with Voltaren)  Message clinic if not improving by next week    Return if symptoms worsen or fail to improve.  I, Asher Muir, CMA, am acting as scribe for Willeen Niece, MD.   Documentation: I have reviewed the above documentation for  accuracy and completeness, and I agree with the above.  Willeen Niece, MD

## 2023-05-19 NOTE — Patient Instructions (Addendum)
Start doxycycline 100 mg capsule - take 1 cap by mouth twice daily with food and drink for 2 weeks  Doxycycline should be taken with food to prevent nausea. Do not lay down for 30 minutes after taking. Be cautious with sun exposure and use good sun protection while on this medication. Pregnant women should not take this medication.   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 Triggering or aggravation of the disease may occur following traumatic hair care practices, such as cornrows and braiding, extensions, weaves with sewn-in or glued-on hair, use of hot combs, and frequent use of hair relaxers. These practices should be discontinued to slow progression of disease. Therapies may stop or slow progression but generally do not lead to hair regrowth in scarred areas.          Due to recent changes in healthcare laws, you may see results of your pathology and/or laboratory studies on MyChart before the doctors have had a chance to review them. We understand that in some cases there may be results that are confusing or concerning to you. Please understand that not all results are received at the same time and often the doctors may need to interpret multiple results in order to provide you with the best plan of care or course of treatment. Therefore, we ask that you please give [place] 2 business days to thoroughly review all your results before contacting the office for clarification. Should we see a critical lab result,  you will be contacted sooner.   If You Need Anything After Your Visit  If you have any questions or concerns for your doctor, please call our main line at [phone] and press option 4 to reach your doctor's medical assistant. If no one answers, please leave a voicemail as directed and we will return your call as soon as possible. Messages left after 4 pm will be answered the following business day.   You may also send [place] a message via MyChart. We typically respond to MyChart messages within 1-2 business days.  For prescription refills, please ask your pharmacy to contact our office. Our fax number is [phone].  If you have an urgent issue when the clinic is closed that cannot wait until the next business day, you can page your doctor at the number below.    Please note that while we do our best to be available for urgent issues outside of office hours, we are not available 24/7.   If you have an urgent issue and are unable to reach [place], you may choose to seek medical care at your doctor's office, retail clinic, urgent care center, or emergency room.  If you have a medical emergency, please immediately call 911 or go to the emergency department.  Pager Numbers  - Dr. Gwen Pounds: [phone]  - Dr. Neale Burly: [phone]  - Dr. Roseanne Reno: [phone]  In the event of inclement weather, please call our main line at [phone] for an update on the status of  any delays or closures.  Dermatology Medication Tips: Please keep the boxes that topical medications come in in order to help keep track of the instructions about where and how to use these. Pharmacies typically print the medication instructions only on the boxes and not directly on the medication tubes.   If your medication is too expensive, please contact our office at [phone] option 4 or send [place] a message through MyChart.   We are unable to tell what your co-pay for medications will be in advance as this is different  depending on your insurance coverage. However, we may be able to find a substitute medication at lower cost or fill out paperwork to get insurance to cover a needed medication.   If a prior authorization is required to get your medication covered by your insurance company, please allow [place] 1-2 business days to complete this process.  Drug prices often vary depending on where the prescription is filled and some pharmacies may offer cheaper prices.  The website www.goodrx.com contains coupons for medications through different pharmacies. The prices here do not account for what the cost may be with help from insurance (it may be cheaper with your insurance), but the website can give you the price if you did not use any insurance.  - You can print the associated coupon and take it with your prescription to the pharmacy.  - You may also stop by our office during regular business hours and pick up a GoodRx coupon card.  - If you need your prescription sent electronically to a different pharmacy, notify our office through [facility] or by phone at [phone] option 4.     Si Usted Necesita Algo Despus de Su Visita  Tambin puede enviarnos un mensaje a travs de Clinical cytogeneticist. Por lo general respondemos a los mensajes de MyChart en el transcurso de 1 a 2 das hbiles.  Para renovar recetas, por favor pida a su farmacia que se ponga en contacto con nuestra oficina. Annie Sable de fax es [place] [phone].  Si tiene un asunto urgente cuando la clnica est cerrada y que no puede esperar hasta el siguiente da hbil, puede llamar/localizar a su doctor(a) al nmero que aparece a continuacin.   Por favor, tenga en cuenta que aunque hacemos todo lo posible para estar disponibles para asuntos urgentes fuera del horario de [place], no estamos disponibles las 24 horas del da, los 7 [address]  [address] de la [place].   Si tiene un problema urgente y no puede comunicarse con nosotros, puede optar por buscar atencin  mdica  en el consultorio de su doctor(a), en una clnica privada, en un centro de atencin urgente o en una sala de emergencias.  Si tiene Engineer, drilling, por favor llame inmediatamente al 911 o vaya a la sala de emergencias.  Nmeros de bper  - Dr. Gwen Pounds: [phone]  - Dra. Moye: [phone]  - Dra. Roseanne Reno: [phone]  En caso de inclemencias del [place], por favor llame a Lacy Duverney principal al [phone] para una actualizacin sobre el [place] de cualquier retraso o cierre.  Consejos para la medicacin en dermatologa: Por favor, guarde las cajas en las que vienen los medicamentos de uso tpico para ayudarle a seguir las instrucciones sobre dnde y cmo usarlos. Las farmacias generalmente imprimen las instrucciones del medicamento slo en las cajas y no directamente en los tubos del [place].   Si su medicamento es muy caro, por favor, pngase en contacto con Rolm Gala llamando al [phone] y presione la  opcin 4 o envenos un mensaje a travs de MyChart.   No podemos decirle cul ser su copago por los medicamentos por adelantado ya que esto es diferente dependiendo de la cobertura de su seguro. Sin embargo, es posible que podamos encontrar un medicamento sustituto a Audiological scientist un formulario para que el seguro cubra el medicamento que se considera necesario.   Si se requiere una autorizacin previa para que su compaa de seguros [place] su medicamento, por favor permtanos de 1 a 2 das hbiles para completar [address]  Los precios de los medicamentos varan con frecuencia dependiendo del Environmental consultant de dnde se surte la receta y alguna farmacias pueden ofrecer precios ms baratos.  El sitio web www.goodrx.com tiene cupones para medicamentos de Health and safety inspector. Los precios aqu no tienen en cuenta lo que podra costar con la ayuda del seguro (puede ser ms barato con su seguro), pero el sitio web puede darle el precio si no utiliz Producer, television/film/video.  - Puede imprimir el cupn correspondiente y llevarlo con su receta a la farmacia.  - Tambin puede pasar por nuestra oficina durante el horario de atencin regular y Education officer, museum una tarjeta de cupones de GoodRx.  - Si necesita que su receta se enve electrnicamente a una farmacia diferente, informe a nuestra oficina a travs de MyChart de [place] o por telfono llamando al [phone] y presione la opcin 4.

## 2023-05-27 ENCOUNTER — Emergency Department (HOSPITAL_COMMUNITY)
Admission: EM | Admit: 2023-05-27 | Discharge: 2023-05-27 | Disposition: A | Discharge status: Home / Self Care | Payer: 59 | Attending: Emergency Medicine | Admitting: Emergency Medicine

## 2023-05-27 ENCOUNTER — Emergency Department (HOSPITAL_COMMUNITY): Payer: 59

## 2023-05-27 ENCOUNTER — Other Ambulatory Visit: Payer: Self-pay

## 2023-05-27 ENCOUNTER — Encounter (HOSPITAL_COMMUNITY): Payer: Self-pay

## 2023-05-27 DIAGNOSIS — R6883 Chills (without fever): Secondary | ICD-10-CM | POA: Insufficient documentation

## 2023-05-27 DIAGNOSIS — R112 Nausea with vomiting, unspecified: Secondary | ICD-10-CM

## 2023-05-27 DIAGNOSIS — R55 Syncope and collapse: Secondary | ICD-10-CM | POA: Insufficient documentation

## 2023-05-27 DIAGNOSIS — R1084 Generalized abdominal pain: Secondary | ICD-10-CM | POA: Insufficient documentation

## 2023-05-27 LAB — CBC
HCT: 38 % (ref 36.0–46.0)
Hemoglobin: 12 g/dL (ref 12.0–15.0)
MCH: 25.9 pg — ABNORMAL LOW (ref 26.0–34.0)
MCHC: 31.6 g/dL (ref 30.0–36.0)
MCV: 82.1 fL (ref 80.0–100.0)
Platelets: 226 10*3/uL (ref 150–400)
RBC: 4.63 MIL/uL (ref 3.87–5.11)
RDW: 13.9 % (ref 11.5–15.5)
WBC: 8.7 10*3/uL (ref 4.0–10.5)
nRBC: 0 % (ref 0.0–0.2)

## 2023-05-27 LAB — COMPREHENSIVE METABOLIC PANEL
ALT: 13 U/L (ref 0–44)
ALT: 12 U/L (ref 0–44)
AST: 40 U/L (ref 15–41)
AST: 32 U/L (ref 15–41)
Albumin: 3.4 g/dL — ABNORMAL LOW (ref 3.5–5.0)
Albumin: 2.9 g/dL — ABNORMAL LOW (ref 3.5–5.0)
Alkaline Phosphatase: 72 U/L (ref 38–126)
Alkaline Phosphatase: 57 U/L (ref 38–126)
Anion gap: 12 (ref 5–15)
Anion gap: 8 (ref 5–15)
BUN: 16 mg/dL (ref 6–20)
BUN: 14 mg/dL (ref 6–20)
CO2: 18 mmol/L — ABNORMAL LOW (ref 22–32)
CO2: 20 mmol/L — ABNORMAL LOW (ref 22–32)
Calcium: 8.9 mg/dL (ref 8.9–10.3)
Calcium: 7.7 mg/dL — ABNORMAL LOW (ref 8.9–10.3)
Chloride: 104 mmol/L (ref 98–111)
Chloride: 106 mmol/L (ref 98–111)
Creatinine, Ser: 0.83 mg/dL (ref 0.44–1.00)
Creatinine, Ser: 0.67 mg/dL (ref 0.44–1.00)
GFR, Estimated: >60 mL/min (ref >60)
GFR, Estimated: >60 mL/min (ref >60)
Glucose, Bld: 136 mg/dL — ABNORMAL HIGH (ref 70–99)
Glucose, Bld: 115 mg/dL — ABNORMAL HIGH (ref 70–99)
Potassium: 5.4 mmol/L — ABNORMAL HIGH (ref 3.5–5.1)
Potassium: 4.4 mmol/L (ref 3.5–5.1)
Sodium: 134 mmol/L — ABNORMAL LOW (ref 135–145)
Sodium: 134 mmol/L — ABNORMAL LOW (ref 135–145)
Total Bilirubin: 1.5 mg/dL — ABNORMAL HIGH (ref 0.3–1.2)
Total Bilirubin: 1.1 mg/dL (ref 0.3–1.2)
Total Protein: 7.3 g/dL (ref 6.5–8.1)
Total Protein: 6.2 g/dL — ABNORMAL LOW (ref 6.5–8.1)

## 2023-05-27 LAB — URINALYSIS, ROUTINE W REFLEX MICROSCOPIC

## 2023-05-27 LAB — RAPID URINE DRUG SCREEN, HOSP PERFORMED
Amphetamines: NOT DETECTED
Barbiturates: NOT DETECTED
Benzodiazepines: NOT DETECTED
Cocaine: NOT DETECTED
Opiates: POSITIVE — AB
Tetrahydrocannabinol: POSITIVE — AB

## 2023-05-27 LAB — URINALYSIS, MICROSCOPIC (REFLEX): RBC / HPF: >50 RBC/hpf (ref 0–5)

## 2023-05-27 LAB — CBG MONITORING, ED: Glucose-Capillary: 120 mg/dL — ABNORMAL HIGH (ref 70–99)

## 2023-05-27 LAB — LIPASE, BLOOD: Lipase: 26 U/L (ref 11–51)

## 2023-05-27 LAB — I-STAT BETA HCG BLOOD, ED (MC, WL, AP ONLY): I-stat hCG, quantitative: <5 m[IU]/mL (ref <5)

## 2023-05-27 MED ORDER — IOHEXOL 350 MG/ML SOLN
75.0000 mL | Freq: Once | INTRAVENOUS | Status: AC | PRN
  Administered 2023-05-27: 75 mL via INTRAVENOUS

## 2023-05-27 MED ORDER — SODIUM CHLORIDE 0.9 % IV BOLUS
1000.0000 mL | Freq: Once | INTRAVENOUS | Status: AC
  Administered 2023-05-27: 1000 mL via INTRAVENOUS

## 2023-05-27 MED ORDER — ONDANSETRON HCL 4 MG/2ML IJ SOLN
4.0000 mg | Freq: Once | INTRAMUSCULAR | Status: AC | PRN
  Administered 2023-05-27: 4 mg via INTRAVENOUS
  Filled 2023-05-27: qty 2

## 2023-05-27 MED ORDER — MORPHINE SULFATE (PF) 4 MG/ML IV SOLN
4.0000 mg | Freq: Once | INTRAVENOUS | Status: AC
  Administered 2023-05-27: 4 mg via INTRAVENOUS
  Filled 2023-05-27: qty 1

## 2023-05-27 MED ORDER — KETOROLAC TROMETHAMINE 15 MG/ML IJ SOLN
15.0000 mg | Freq: Once | INTRAMUSCULAR | Status: AC
  Administered 2023-05-27: 15 mg via INTRAVENOUS
  Filled 2023-05-27: qty 1

## 2023-05-27 MED ORDER — ONDANSETRON HCL 4 MG PO TABS: 4.0000 mg | ORAL_TABLET | Freq: Four times a day (QID) | ORAL | 0 refills | Status: AC

## 2023-05-27 NOTE — ED Notes (Signed)
Phlebotomy attempted to draw blood. Blood draw was unsuccessful.

## 2023-05-27 NOTE — ED Provider Notes (Signed)
[place] EMERGENCY DEPARTMENT AT [facility] Provider Note   CSN: [id number] Arrival date & time: 05/27/23  1456     History  Chief Complaint  Patient presents with   Syncopal Event   Emesis   Abdominal Pain    Ashley Casey is a 42 y.o. female.  42 y.o female with no PMH presents to the ED with a chief complaint of nausea, vomiting and generalized abdominal pain x 1 am. Patient reports multiple episodes of nausea, vomiting, generalized abdominal pain.  She feels that she has the same thing that her small son has at home.  She reports the symptoms began suddenly around 1 PM, her son has been sick for the past few days.  She has not taken any medication for improvement of symptoms.  She was ordered Zofran in triage however did not take this medication.  Patient is currently on her menstrual cycle and denies any concern for pregnancy at this time.  No alleviating factors.  No fever but does endorse some chills. No urinary symptoms or gynecological symptoms.   The history is provided by the patient.  Emesis Associated symptoms: abdominal pain and chills   Associated symptoms: no fever and no sore throat   Abdominal Pain Associated symptoms: chills, nausea and vomiting   Associated symptoms: no chest pain, no fever, no shortness of breath and no sore throat        Home Medications Prior to Admission medications   Medication Sig Start Date End Date Taking? Authorizing Provider  ondansetron (ZOFRAN) 4 MG tablet Take 1 tablet (4 mg total) by mouth every 6 (six) hours for 7 days. 05/27/23 06/03/23 Yes Honore Wipperfurth, PA-C  albuterol (VENTOLIN HFA) 108 (90 Base) MCG/ACT inhaler Inhale 1-2 puffs into the lungs every 6 (six) hours as needed for wheezing or shortness of breath. 04/06/23   Rema Fendt, NP  doxycycline (MONODOX) 100 MG capsule Take 1 capsule (100 mg total) by mouth 2 (two) times daily for 14 days. Take with food and drink 05/19/23 06/02/23  Willeen Niece, MD   EPINEPHrine 0.3 mg/0.3 mL IJ SOAJ injection Inject 0.3 mg into the muscle as needed for anaphylaxis. 10/01/22   Harlene Salts A, PA-C  hydrOXYzine (VISTARIL) 25 MG capsule Take 1 capsule (25 mg total) by mouth every 8 (eight) hours as needed. 04/06/23   Rema Fendt, NP  ibuprofen (ADVIL) 800 MG tablet TAKE 1 TABLET(800 MG) BY MOUTH EVERY 8 HOURS AS NEEDED FOR MODERATE PAIN 04/06/23   Rema Fendt, NP  methocarbamol (ROBAXIN) 500 MG tablet Take 1 tablet (500 mg total) by mouth every 8 (eight) hours as needed for muscle spasms. 04/06/23   Rema Fendt, NP  mupirocin ointment (BACTROBAN) 2 % Apply 1 Application topically daily. Apply topically to wound 05/19/23   Willeen Niece, MD  Prenatal Vit-Fe Fumarate-FA (PRENATAL MULTIVITAMIN) TABS tablet Take 1 tablet by mouth daily at 12 noon.    [provider]  sitaGLIPtin (JANUVIA) 25 MG tablet TAKE 1 TABLET BY MOUTH EVERY DAY 05/12/23   Rema Fendt, NP  clonazePAM (KLONOPIN) 0.5 MG tablet Take 1 tablet (0.5 mg total) by mouth 2 (two) times daily as needed for anxiety. 05/16/19 02/15/20  Belinda Fisher, PA-C      Allergies    Food, Other, and Tegretol [carbamazepine]    Review of Systems   Review of Systems  Constitutional:  Positive for chills. Negative for fever.  HENT:  Negative for sore throat.  Respiratory:  Negative for shortness of breath.   Cardiovascular:  Negative for chest pain.  Gastrointestinal:  Positive for abdominal pain, nausea and vomiting.  Genitourinary:  Negative for flank pain.  Musculoskeletal:  Negative for back pain.  All other systems reviewed and are negative.   Physical Exam Updated Vital Signs BP 117/81 (BP Location: Right Arm)   Pulse 84   Temp 98.8 F (37.1 C)   Resp 18   SpO2 100%  Physical Exam Vitals and nursing note reviewed.  Constitutional:      General: She is not in acute distress.    Appearance: She is well-developed. She is ill-appearing.     Comments: Actively vomiting.   HENT:      Head: Normocephalic and atraumatic.     Mouth/Throat:     Pharynx: No oropharyngeal exudate.  Eyes:     Pupils: Pupils are equal, round, and reactive to light.  Cardiovascular:     Rate and Rhythm: Regular rhythm.     Heart sounds: Normal heart sounds.  Pulmonary:     Effort: Pulmonary effort is normal. No respiratory distress.     Breath sounds: Normal breath sounds.  Abdominal:     General: Bowel sounds are normal. There is no distension.     Palpations: Abdomen is soft.     Tenderness: There is generalized abdominal tenderness. There is no right CVA tenderness or left CVA tenderness.  Musculoskeletal:        General: No tenderness or deformity.     Cervical back: Normal range of motion.     Right lower leg: No edema.     Left lower leg: No edema.  Skin:    General: Skin is warm and dry.  Neurological:     Mental Status: She is alert and oriented to person, place, and time.     ED Results / Procedures / Treatments   Labs (all labs ordered are listed, but only abnormal results are displayed) Labs Reviewed  CBC - Abnormal; Notable for the following components:      Result Value   MCH 25.9 (*)    All other components within normal limits  URINALYSIS, ROUTINE W REFLEX MICROSCOPIC - Abnormal; Notable for the following components:   Color, Urine RED (*)    APPearance CLOUDY (*)    Glucose, UA   (*)    Value: TEST NOT REPORTED DUE TO COLOR INTERFERENCE OF URINE PIGMENT   Hgb urine dipstick   (*)    Value: TEST NOT REPORTED DUE TO COLOR INTERFERENCE OF URINE PIGMENT   Bilirubin Urine   (*)    Value: TEST NOT REPORTED DUE TO COLOR INTERFERENCE OF URINE PIGMENT   Ketones, ur   (*)    Value: TEST NOT REPORTED DUE TO COLOR INTERFERENCE OF URINE PIGMENT   Protein, ur   (*)    Value: TEST NOT REPORTED DUE TO COLOR INTERFERENCE OF URINE PIGMENT   Nitrite   (*)    Value: TEST NOT REPORTED DUE TO COLOR INTERFERENCE OF URINE PIGMENT   Leukocytes,Ua   (*)    Value: TEST NOT  REPORTED DUE TO COLOR INTERFERENCE OF URINE PIGMENT   All other components within normal limits  COMPREHENSIVE METABOLIC PANEL - Abnormal; Notable for the following components:   Sodium 134 (*)    Potassium 5.4 (*)    CO2 18 (*)    Glucose, Bld 136 (*)    Albumin 3.4 (*)    Total Bilirubin 1.5 (*)  All other components within normal limits  RAPID URINE DRUG SCREEN, HOSP PERFORMED - Abnormal; Notable for the following components:   Opiates POSITIVE (*)    Tetrahydrocannabinol POSITIVE (*)    All other components within normal limits  URINALYSIS, MICROSCOPIC (REFLEX) - Abnormal; Notable for the following components:   Bacteria, UA FEW (*)    All other components within normal limits  CBG MONITORING, ED - Abnormal; Notable for the following components:   Glucose-Capillary 120 (*)    All other components within normal limits  LIPASE, BLOOD  COMPREHENSIVE METABOLIC PANEL  I-STAT BETA HCG BLOOD, ED ([facility], [facility], [facility] ONLY)    EKG EKG Interpretation  Date/Time:  Wednesday May 27 2023 15:21:37 EDT Ventricular Rate:  90 PR Interval:  166 QRS Duration: 88 QT Interval:  338 QTC Calculation: 413 R Axis:   45 Text Interpretation: Normal sinus rhythm Nonspecific T wave abnormality Confirmed by Gloris Manchester [phone]) on 05/27/2023 6:01:56 PM  Radiology CT ABDOMEN PELVIS W CONTRAST  Result Date: 05/27/2023 CLINICAL DATA:  Acute nonlocalized abdominal pain. EXAM: CT ABDOMEN AND PELVIS WITH CONTRAST TECHNIQUE: Multidetector CT imaging of the abdomen and pelvis was performed using the standard protocol following bolus administration of intravenous contrast. RADIATION DOSE REDUCTION: This exam was performed according to the departmental dose-optimization program which includes automated exposure control, adjustment of the mA and/or kV according to patient size and/or use of iterative reconstruction technique. CONTRAST:  75mL OMNIPAQUE IOHEXOL 350 MG/ML SOLN COMPARISON:  CT pelvis 01/27/2023 FINDINGS: Lower  chest: Mild dependent atelectasis in the lung bases. Hepatobiliary: No focal liver abnormality is seen. Status post cholecystectomy. No biliary dilatation. Pancreas: Unremarkable. No pancreatic ductal dilatation or surrounding inflammatory changes. Spleen: Normal in size without focal abnormality. Adrenals/Urinary Tract: Adrenal glands are unremarkable. Kidneys are normal, without renal calculi, focal lesion, or hydronephrosis. Bladder is unremarkable. Stomach/Bowel: Stomach, small bowel, and colon are not abnormally distended. No wall thickening or inflammatory changes. Appendix is normal. Vascular/Lymphatic: No significant vascular findings are present. No enlarged abdominal or pelvic lymph nodes. Reproductive: 3.9 cm diameter simple appearing cyst in the right ovary. Uterus and left ovary are not enlarged. Other: No free air or free fluid in the abdomen. Abdominal wall musculature appears intact. Musculoskeletal: No acute or significant osseous findings. IMPRESSION: 1. Right ovarian simple-appearing cyst measuring 3.9 cm. No follow-up imaging is recommended. Reference: JACR 2020 Feb;17(2):248-254 2. No evidence of bowel obstruction or inflammation. Appendix is normal. Electronically Signed   By: Burman Nieves M.D.   On: 05/27/2023 19:25    Procedures Procedures    Medications Ordered in ED Medications  ondansetron (ZOFRAN) injection 4 mg (4 mg Intravenous Given 05/27/23 1732)  sodium chloride 0.9 % bolus 1,000 mL (0 mLs Intravenous Stopped 05/27/23 2008)  morphine (PF) 4 MG/ML injection 4 mg (4 mg Intravenous Given 05/27/23 1811)  iohexol (OMNIPAQUE) 350 MG/ML injection 75 mL (75 mLs Intravenous Contrast Given 05/27/23 1917)  ketorolac (TORADOL) 15 MG/ML injection 15 mg (15 mg Intravenous Given 05/27/23 2049)    ED Course/ Medical Decision Making/ A&P Clinical Course as of 05/27/23 2114  Wed May 27, 2023  2047 Tetrahydrocannabinol(!): POSITIVE [JS]  2047 Opiates(!): POSITIVE [JS]  2047 Bacteria,  UA(!): FEW [JS]    Clinical Course User Index [JS] Claude Manges, PA-C                             Medical Decision Making Amount and/or Complexity of Data Reviewed  Labs: ordered. Decision-making details documented in ED Course. Radiology: ordered.  Risk Prescription drug management.     This patient presents to the ED for concern of nausea, vomiting, this involves a number of treatment options, and is a complaint that carries with it a high risk of complications and morbidity.  The differential diagnosis includes ectopic, viral illness versus cholecystitis.    Co morbidities: Discussed in HPI   Brief History:  SEE HPI.   EMR reviewed including pt PMHx, past surgical history and past visits to ER.   See HPI for more details   Lab Tests:  I ordered and independently interpreted labs.  The pertinent results include:    I personally reviewed all laboratory work and imaging. Metabolic panel without any acute abnormality specifically kidney function within normal limits and no significant electrolyte abnormalities. CBC without leukocytosis or significant anemia. Repeat CMP due to elevated potassium, obtained repeat.    Imaging Studies:  NAD. I personally reviewed all imaging studies and no acute abnormality found. I agree with radiology interpretation.  Cardiac Monitoring:  The patient was maintained on a cardiac monitor.  I personally viewed and interpreted the cardiac monitored which showed an underlying rhythm of: NSR 90 EKG non-ischemic   Medicines ordered:  I ordered medication including bolus, zofran, toradol  for symptomatic treatment  Reevaluation of the patient after these medicines showed that the patient improved I have reviewed the patients home medicines and have made adjustments as needed  Reevaluation:  After the interventions noted above I re-evaluated patient and found that they have :improved  Social Determinants of Health:  The patient's  social determinants of health were a factor in the care of this patient   Problem List / ED Course:  Patient presents to the ED with a chief complaint of nausea, vomiting which began around 1 PM today, reports her son is sick with similar symptoms at home.  She is currently on her menstrual cycle and denies any concern for pregnancy at this time.  She reports she had a near syncopal episode as she felt so weak after vomiting.  No blood in her stool, no blood in her emesis.  Blood work is unremarkable with a CBC with no leukocytosis, hemoglobin is stable.  CT MP remarkable for some slight hyperkalemia, with no prior history, suspect likely hemolyzed specimen therefore obtain repeat.  Creatinine levels normal.  LFTs are within normal limits.  Lipase levels normal.  CT of her abdomen and pelvis was obtained due to generalized abdominal pain and this is normal at this time. Patient has completed p.o. trial at this time, she remains hemodynamically stable, we discussed likely viral illness, will send home with a short course of Zofran along with continue to push fluids.  She is agreeable to plan and treatment.   Dispostion:  After consideration of the diagnostic results and the patients response to treatment, I feel that the patent would benefit from continue hydration outpatient follow up.     Portions of this note were generated with Scientist, clinical (histocompatibility and immunogenetics). Dictation errors may occur despite best attempts at proofreading.   Final Clinical Impression(s) / ED Diagnoses Final diagnoses:  Nausea and vomiting, unspecified vomiting type    Rx / DC Orders ED Discharge Orders          Ordered    ondansetron (ZOFRAN) 4 MG tablet  Every 6 hours        05/27/23 2114  Claude Manges, PA-C 05/27/23 2114    Gloris Manchester, MD 05/28/23 [phone]

## 2023-05-27 NOTE — ED Notes (Addendum)
Pt resting on stretcher, no complaints at this time.

## 2023-05-27 NOTE — Discharge Instructions (Addendum)
Your laboratories also are within normal limits today.  You may continue to hydrate with plenty of fluids along with Gatorade.  Follow-up with your primary care physician in 1 week for further evaluation and symptoms.  If you experience any worsening symptoms return to the emergency department.

## 2023-05-27 NOTE — ED Notes (Signed)
Two attempts made at drawing this pts blood, no success.

## 2023-05-27 NOTE — ED Triage Notes (Signed)
Pt BIB [facility] from home d/t flu-like s/s recently & a witnessed syncopal episode. Pt does recall this event & people said that she was laying on the ground for 30 minutes, unknown time of LOC. A/Ox4 with EMS & said she is too weak to move & want to lay down. V/S on scene are 114/62, 90 bpm, 99% RA, CBG 131. Has been vomiting intermittently since 1300, 20g Lt AC PIV, c/o 7/10 abd pain.

## 2023-05-27 NOTE — ED Provider Triage Note (Addendum)
Emergency Medicine Provider Triage Evaluation Note  DORISE EGELER , a 42 y.o. female  was evaluated in triage.  Pt complains of nausea and vomiting x 1 pm.She feels chills and bodyaches feels she has "whatever my son has". She has not taken any medication for improvement in symptoms.  LMP currently on her cycle.   Review of Systems  Positive: Nausea, vomiting, chills Negative: Chest pain, sob, fever  Physical Exam  BP 117/88 (BP Location: Right Arm)   Pulse 84   Temp (!) 97.5 F (36.4 C) (Oral)   Resp (!) 22   SpO2 100%  Gen:   Awake, no distress   Resp:  Normal effort  MSK:   Moves extremities without difficulty  Other:  Abdomen is soft, non tender to palpation.   Medical Decision Making  Medically screening exam initiated at 3:27 PM.  Appropriate orders placed.  Cyndi Lennert A Kovalski was informed that the remainder of the evaluation will be completed by another provider, this initial triage assessment does not replace that evaluation, and the importance of remaining in the ED until their evaluation is complete.     Claude Manges, PA-C 05/27/23 1529    Claude Manges, PA-C 05/27/23 1530

## 2023-05-28 ENCOUNTER — Other Ambulatory Visit: Payer: Self-pay | Admitting: Family

## 2023-05-28 ENCOUNTER — Encounter: Payer: Self-pay | Admitting: Family

## 2023-05-28 DIAGNOSIS — R52 Pain, unspecified: Secondary | ICD-10-CM

## 2023-05-28 MED ORDER — MELOXICAM 7.5 MG PO TABS
7.5000 mg | ORAL_TABLET | Freq: Every day | ORAL | 1 refills | Status: DC
Start: 2023-05-28 — End: 2024-01-04

## 2023-05-28 NOTE — Telephone Encounter (Signed)
I have attempted without success to contact this patient by phone to return their call and I left a message on answering machine.

## 2023-05-28 NOTE — Telephone Encounter (Signed)
Hold Ibuprofen. Meloxicam prescribed. Schedule appointment with Primary Care.

## 2023-05-30 ENCOUNTER — Telehealth: Payer: Managed Care, Other (non HMO) | Admitting: Family Medicine

## 2023-05-30 DIAGNOSIS — R6889 Other general symptoms and signs: Secondary | ICD-10-CM

## 2023-05-30 NOTE — Progress Notes (Signed)
Virtual Visit Consent   Ashley Casey, you are scheduled for a virtual visit with a [facility] provider today. Just as with appointments in the office, your consent must be obtained to participate. Your consent will be active for this visit and any virtual visit you may have with one of our providers in the next 365 days. If you have a MyChart account, a copy of this consent can be sent to you electronically.  As this is a virtual visit, video technology does not allow for your provider to perform a traditional examination. This may limit your provider's ability to fully assess your condition. If your provider identifies any concerns that need to be evaluated in person or the need to arrange testing (such as labs, EKG, etc.), we will make arrangements to do so. Although advances in technology are sophisticated, we cannot ensure that it will always work on either your end or our end. If the connection with a video visit is poor, the visit may have to be switched to a telephone visit. With either a video or telephone visit, we are not always able to ensure that we have a secure connection.  By engaging in this virtual visit, you consent to the provision of healthcare and authorize for your insurance to be billed (if applicable) for the services provided during this visit. Depending on your insurance coverage, you may receive a charge related to this service.  I need to obtain your verbal consent now. Are you willing to proceed with your visit today? Cyndi Lennert A Blackwood has provided verbal consent on 05/30/2023 for a virtual visit (video or telephone). Ashley Casey, [place]  Date: 05/30/2023 10:30 AM  Virtual Visit via Video Note   I, Ashley Casey, connected with  CLOTEAL DUNNELL  ([id number], 06-01-1981) on 05/30/23 at 10:30 AM EDT by a video-enabled telemedicine application and verified that I am speaking with the correct person using two identifiers.  Location: Patient: Virtual Visit Location Patient:  Home Provider: Virtual Visit Location Provider: Home Office   I discussed the limitations of evaluation and management by telemedicine and the availability of in person appointments. The patient expressed understanding and agreed to proceed.    History of Present Illness: Ashley Casey is a 42 y.o. who identifies as a female who was assigned female at birth, and is being seen today for Pt states she was admitted to the hospital on Wednesday and was discharged but still feeling ill.   Pt states unable to move, feels weak and think she may have COVID but doesn't think she was tested for COVID at the hospital. Pt states she has bodyaches and can't move.  Pt states she needs help going to the bathroom.  Pt states the only thing she is not having is the nausea and vomiting. Pt denies fever. Pt states she can barely get out of bed. Pt denies coughing or congestion pr other flu or cold like symptoms.   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     Tegretol [Carbamazepine] Other (See Comments)    Reaction:  Seizures    Medications:  Current Outpatient Medications:    albuterol (VENTOLIN HFA) 108 (90 Base) MCG/ACT inhaler, Inhale 1-2 puffs into the lungs every 6 (six) hours as needed for wheezing  or shortness of breath., Disp: 8 g, Rfl: 0   doxycycline (MONODOX) 100 MG capsule, Take 1 capsule (100 mg total) by mouth 2 (two) times daily for 14 days. Take with food and drink, Disp: 28 capsule, Rfl: 0   EPINEPHrine 0.3 mg/0.3 mL IJ SOAJ injection, Inject 0.3 mg into the muscle as needed for anaphylaxis., Disp: 1 each, Rfl: 1   hydrOXYzine (VISTARIL) 25 MG capsule, Take 1 capsule (25 mg total) by mouth every 8 (eight) hours as needed., Disp: 30 capsule, Rfl: 1   ibuprofen (ADVIL) 800 MG tablet, TAKE 1 TABLET(800 MG) BY MOUTH EVERY 8 HOURS AS NEEDED FOR MODERATE PAIN, Disp: 30 tablet, Rfl: 2   meloxicam (MOBIC) 7.5 MG tablet, Take 1 tablet (7.5 mg total) by mouth daily., Disp: 30 tablet, Rfl: 1   methocarbamol (ROBAXIN) 500 MG tablet, Take 1 tablet (500 mg total) by mouth every 8 (eight) hours as needed for muscle spasms., Disp: 30 tablet, Rfl: 1   mupirocin ointment (BACTROBAN) 2 %, Apply 1 Application topically daily. Apply topically to wound, Disp: 22 g, Rfl: 0   ondansetron (ZOFRAN) 4 MG tablet, Take 1 tablet (4 mg total) by mouth every 6 (six) hours for 7 days., Disp: 28 tablet, Rfl: 0   Prenatal Vit-Fe Fumarate-FA (PRENATAL MULTIVITAMIN) TABS tablet, Take 1 tablet by mouth daily at 12 noon., Disp: , Rfl:    sitaGLIPtin (JANUVIA) 25 MG tablet, TAKE 1 TABLET BY MOUTH EVERY DAY, Disp: 30 tablet, Rfl: 2  Observations/Objective: Patient is well-developed, well-nourished in no acute distress.  Resting comfortably at home.  Head is normocephalic, atraumatic.  No labored breathing.  Speech is clear and coherent with logical content.  Patient is alert and oriented at baseline.    Assessment and Plan: 1. General ill feeling -Advised Patient to return to the emergency room for further evaluation as we are unsure what she is experiencing.  -Advised Pt since her symptoms started Wednesday and we do not have a confirmatory COVID test, we cannot treat with Paxlovid.  -Pt verbalized  understanding   Follow Up Instructions: I discussed the assessment and treatment plan with the patient. The patient was provided an opportunity to ask questions and all were answered. The patient agreed with the plan and demonstrated an understanding of the instructions.  A copy of instructions were sent to the patient via MyChart unless otherwise noted below.     The patient was advised to call back or seek an in-person evaluation if the symptoms worsen or if the condition fails to improve as anticipated.  Time:  I spent 15 minutes with the patient via telehealth technology discussing the above problems/concerns.    Ashley Pandy, PA-C

## 2023-05-30 NOTE — Patient Instructions (Signed)
Ashley Casey, thank you for joining Reed Pandy, PA-C for today's virtual visit.  While this provider is not your primary care provider (PCP), if your PCP is located in our provider database this encounter information will be shared with them immediately following your visit.   A [facility] MyChart account gives you access to today's visit and all your visits, tests, and labs performed at [facility] " click here if you don't have a [facility] MyChart account or go to [email or web address]  Consent: (Patient) Ashley Casey provided verbal consent for this virtual visit at the beginning of the encounter.  Current Medications:  Current Outpatient Medications:    albuterol (VENTOLIN HFA) 108 (90 Base) MCG/ACT inhaler, Inhale 1-2 puffs into the lungs every 6 (six) hours as needed for wheezing or shortness of breath., Disp: 8 g, Rfl: 0   doxycycline (MONODOX) 100 MG capsule, Take 1 capsule (100 mg total) by mouth 2 (two) times daily for 14 days. Take with food and drink, Disp: 28 capsule, Rfl: 0   EPINEPHrine 0.3 mg/0.3 mL IJ SOAJ injection, Inject 0.3 mg into the muscle as needed for anaphylaxis., Disp: 1 each, Rfl: 1   hydrOXYzine (VISTARIL) 25 MG capsule, Take 1 capsule (25 mg total) by mouth every 8 (eight) hours as needed., Disp: 30 capsule, Rfl: 1   ibuprofen (ADVIL) 800 MG tablet, TAKE 1 TABLET(800 MG) BY MOUTH EVERY 8 HOURS AS NEEDED FOR MODERATE PAIN, Disp: 30 tablet, Rfl: 2   meloxicam (MOBIC) 7.5 MG tablet, Take 1 tablet (7.5 mg total) by mouth daily., Disp: 30 tablet, Rfl: 1   methocarbamol (ROBAXIN) 500 MG tablet, Take 1 tablet (500 mg total) by mouth every 8 (eight) hours as needed for muscle spasms., Disp: 30 tablet, Rfl: 1   mupirocin ointment (BACTROBAN) 2 %, Apply 1 Application topically daily. Apply topically to wound, Disp: 22 g, Rfl: 0   ondansetron (ZOFRAN) 4 MG tablet, Take 1 tablet (4 mg total) by mouth every 6 (six) hours for 7 days., Disp: 28 tablet,  Rfl: 0   Prenatal Vit-Fe Fumarate-FA (PRENATAL MULTIVITAMIN) TABS tablet, Take 1 tablet by mouth daily at 12 noon., Disp: , Rfl:    sitaGLIPtin (JANUVIA) 25 MG tablet, TAKE 1 TABLET BY MOUTH EVERY DAY, Disp: 30 tablet, Rfl: 2   Medications ordered in this encounter:  No orders of the defined types were placed in this encounter.    *If you need refills on other medications prior to your next appointment, please contact your pharmacy*  Follow-Up: Call back or seek an in-person evaluation if the symptoms worsen or if the condition fails to improve as anticipated.  [facility] [phone]  Other Instructions As discussed, recommendations are to return to the emergency room for further evaluation of your current symptoms.    If you have been instructed to have an in-person evaluation today at a local Urgent Care facility, please use the link below. It will take you to a list of all of our available [facility], including address, phone number and hours of operation. Please do not delay care.  [facility]  If you or a family member do not have a primary care provider, use the link below to schedule a visit and establish care. When you choose a [facility] primary care physician or advanced practice provider, you gain a long-term partner in health. Find a Primary Care Provider  Learn more about 's in-office and virtual care  options: [facility] - Get Care Now

## 2023-06-01 NOTE — Progress Notes (Unsigned)
Patient ID: Ashley Casey, female    DOB: [birth year]  MRN: [id number]  CC: Emergency Department Follow-Up  Subjective: Ashley Casey is a 42 y.o. female who presents for Emergency Department follow-up.  Her concerns today include:  05/27/2023 [facility] Emergency Department at [facility] per DO note:                    Medical Decision Making Amount and/or Complexity of Data Reviewed Labs: ordered. Decision-making details documented in ED Course. Radiology: ordered.   Risk Prescription drug management.     This patient presents to the ED for concern of nausea, vomiting, this involves a number of treatment options, and is a complaint that carries with it a high risk of complications and morbidity.  The differential diagnosis includes ectopic, viral illness versus cholecystitis.      Co morbidities: Discussed in HPI     Brief History:   SEE HPI.   EMR reviewed including pt PMHx, past surgical history and past visits to ER.    See HPI for more details     Lab Tests:   I ordered and independently interpreted labs.  The pertinent results include:     I personally reviewed all laboratory work and imaging. Metabolic panel without any acute abnormality specifically kidney function within normal limits and no significant electrolyte abnormalities. CBC without leukocytosis or significant anemia. Repeat CMP due to elevated potassium, obtained repeat.      Imaging Studies:   NAD. I personally reviewed all imaging studies and no acute abnormality found. I agree with radiology interpretation.   Cardiac Monitoring:   The patient was maintained on a cardiac monitor.  I personally viewed and interpreted the cardiac monitored which showed an underlying rhythm of: NSR 90 EKG non-ischemic     Medicines ordered:   I ordered medication including bolus, zofran, toradol  for symptomatic treatment  Reevaluation of the patient after these medicines showed that the patient  improved I have reviewed the patients home medicines and have made adjustments as needed   Reevaluation:   After the interventions noted above I re-evaluated patient and found that they have :improved   Social Determinants of Health:   The patient's social determinants of health were a factor in the care of this patient     Problem List / ED Course:   Patient presents to the ED with a chief complaint of nausea, vomiting which began around 1 PM today, reports her son is sick with similar symptoms at home.  She is currently on her menstrual cycle and denies any concern for pregnancy at this time.  She reports she had a near syncopal episode as she felt so weak after vomiting.  No blood in her stool, no blood in her emesis.  Blood work is unremarkable with a CBC with no leukocytosis, hemoglobin is stable.  CT MP remarkable for some slight hyperkalemia, with no prior history, suspect likely hemolyzed specimen therefore obtain repeat.  Creatinine levels normal.  LFTs are within normal limits.  Lipase levels normal.  CT of her abdomen and pelvis was obtained due to generalized abdominal pain and this is normal at this time. Patient has completed p.o. trial at this time, she remains hemodynamically stable, we discussed likely viral illness, will send home with a short course of Zofran along with continue to push fluids.  She is agreeable to plan and treatment.     Dispostion:   After consideration of the diagnostic  results and the patients response to treatment, I feel that the patent would benefit from continue hydration outpatient follow up.   05/30/2023 [facility] per PA note: Assessment and Plan: 1. General ill feeling -Advised Patient to return to the emergency room for further evaluation as we are unsure what she is experiencing.  -Advised Pt since her symptoms started Wednesday and we do not have a confirmatory COVID test, we cannot treat with Paxlovid.  -Pt verbalized  understanding  Today's visit 06/03/2023: Abdominal pain, nausea, diarrhea, and sweating worsening. She denies associated red flag symptoms. Reports she did begin to feel better initially but symptoms soon returned. Reports she thinks the hospital should have tested for Covid when she was there. She declines screening of the same on today. [place] needs a doctor's note and plans to return to work on Monday 06/08/2023. No further issues/concerns for discussion today.   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 8 g 0   EPINEPHrine 0.3 mg/0.3 mL IJ SOAJ injection Inject 0.3 mg into the muscle as needed for anaphylaxis. 1 each 1   hydrOXYzine (VISTARIL) 25 MG capsule Take 1 capsule (25 mg total) by mouth every 8 (eight) hours as needed. 30 capsule 1   ibuprofen (ADVIL) 800 MG tablet TAKE 1 TABLET(800 MG) BY MOUTH EVERY 8 HOURS AS NEEDED FOR MODERATE PAIN 30 tablet 2   meloxicam (MOBIC) 7.5 MG tablet Take 1 tablet (7.5 mg total) by mouth daily. 30 tablet 1   methocarbamol (ROBAXIN) 500 MG tablet Take 1 tablet (500 mg total) by mouth every 8 (eight) hours as needed for muscle spasms. 30 tablet 1   ondansetron (ZOFRAN) 4 MG tablet Take 1 tablet (4 mg total) by mouth every 6 (six) hours for 7 days. 28  tablet 0   Prenatal Vit-Fe Fumarate-FA (PRENATAL MULTIVITAMIN) TABS tablet Take 1 tablet by mouth daily at 12 noon.     sitaGLIPtin (JANUVIA) 25 MG tablet TAKE 1 TABLET BY MOUTH EVERY DAY 30 tablet 2   [DISCONTINUED] clonazePAM (KLONOPIN) 0.5 MG tablet Take 1 tablet (0.5 mg total) by mouth 2 (two) times daily as needed for anxiety. 6 tablet 0   No current facility-administered medications on file prior to visit.    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     Tegretol [Carbamazepine] Other (See Comments)    Reaction:  Seizures     Social History   Socioeconomic History   Marital status: Married    Spouse name: Not on file   Number of children:  Not on file   Years of education: Not on file   Highest education level: Associate degree: academic program  Occupational History   Not on file  Tobacco Use   Smoking status: Never    Passive exposure: Never   Smokeless tobacco: Never  Vaping Use   Vaping Use: Never used  Substance and Sexual Activity   Alcohol use: No   Drug use: No   Sexual activity: Yes    Partners: Male    Birth control/protection: None  Other Topics Concern   Not on file  Social History Narrative   Not on file   Social Determinants of Health   Financial Resource Strain: Medium Risk (04/06/2023)   Overall Financial Resource Strain (CARDIA)    Difficulty of Paying Living Expenses: Somewhat hard  Food Insecurity: Food Insecurity Present (04/06/2023)   Hunger Vital Sign    Worried About Running Out of Food in the Last Year: Sometimes true    Ran Out of Food in the Last Year: Sometimes true  Transportation Needs: No Transportation Needs (04/06/2023)   PRAPARE - Administrator, Civil Service (Medical): No    Lack of Transportation (Non-Medical): No   Physical Activity: Sufficiently Active (04/06/2023)   Exercise Vital Sign    Days of Exercise per Week: 5 days    Minutes of Exercise per Session: 90 min  Stress: Stress Concern Present (04/06/2023)   [employer] of Occupational Health - Occupational Stress Questionnaire    Feeling of Stress : Rather much  Social Connections: Moderately Isolated (04/06/2023)   Social Connection and Isolation Panel [NHANES]    Frequency of Communication with Friends and Family: More than three times a week    Frequency of Social Gatherings with Friends and Family: Twice a week    Attends Religious Services: Never    Database administrator or Organizations: No    Attends Engineer, structural: Not on file    Marital Status: Married  Catering manager Violence: Not on file    Family History  Problem Relation Age of Onset   Other Mother        sickle cell anemia   Diabetes Mother    Sickle cell anemia Mother    Arthritis Brother    Asthma Brother    Diabetes Maternal Grandmother    Diabetes Paternal Grandmother     Past Surgical History:  Procedure Laterality Date   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     CHOLECYSTECTOMY  8/29/121   THERAPEUTIC ABORTION     WISDOM TOOTH EXTRACTION      ROS: Review of Systems Negative except as stated above  PHYSICAL EXAM: BP 117/81 (BP Location: Right Arm, Patient Position: Sitting, Cuff Size: Normal)   Pulse 68   Temp 98.1 F (36.7 C)   Resp 14   Ht 5\' 3"  (1.6 m)   Wt 182 lb (82.6 kg)   SpO2 99%   BMI 32.24 kg/m   Physical Exam HENT:     Head: Normocephalic and atraumatic.     Nose: Nose normal.     Mouth/Throat:     Mouth: Mucous membranes are moist.     Pharynx: Oropharynx is clear.  Eyes:     Extraocular Movements: Extraocular movements intact.     Conjunctiva/sclera: Conjunctivae normal.     Pupils: Pupils are equal, round, and reactive to light.  Cardiovascular:     Rate and Rhythm: Normal rate and regular rhythm.  Pulses: Normal pulses.     Heart sounds: Normal heart sounds.  Pulmonary:     Effort: Pulmonary effort is normal.     Breath sounds: Normal breath sounds.  Abdominal:     General: Bowel sounds are normal.     Palpations: Abdomen is soft.  Musculoskeletal:     Cervical back: Normal range of motion and neck supple.  Neurological:     General: No focal deficit present.     Mental Status: She is alert and oriented to person, place, and time.  Psychiatric:        Mood and Affect: Mood normal.        Behavior: Behavior normal.     ASSESSMENT AND PLAN: 1. Abdominal pain, unspecified abdominal location 2. Nausea and vomiting, unspecified vomiting type 3. General ill feeling - Omeprazole and Sucralfate as prescribed. Counseled on medication adherence/adverse effects.  - Patient provided with work letter to return on 06/08/2023. - Referral to Gastroenterology for further evaluation/management. During the interim follow-up with primary provider as scheduled.  - omeprazole (PRILOSEC) 20 MG capsule; Take 1 capsule (20 mg total) by mouth daily.  Dispense: 90 capsule; Refill: 0 - sucralfate (CARAFATE) 1 GM/10ML suspension; Take 10 mLs (1 g total) by mouth 4 (four) times daily.  Dispense: 420 mL; Refill: 1 - Ambulatory referral to Gastroenterology     Patient was given the opportunity to ask questions.  Patient verbalized understanding of the plan and was able to repeat key elements of the plan. Patient was given clear instructions to go to Emergency Department or return to medical center if symptoms don't improve, worsen, or new problems develop.The patient verbalized understanding.   Orders Placed This Encounter  Procedures   Ambulatory referral to Gastroenterology     Requested Prescriptions   Signed Prescriptions Disp Refills   omeprazole (PRILOSEC) 20 MG capsule 90 capsule 0    Sig: Take 1 capsule (20 mg total) by mouth daily.   sucralfate (CARAFATE) 1 GM/10ML suspension 420 mL 1     Sig: Take 10 mLs (1 g total) by mouth 4 (four) times daily.    Follow-up with primary provider as scheduled.   Rema Fendt, NP

## 2023-06-03 ENCOUNTER — Encounter: Payer: Self-pay | Admitting: Family

## 2023-06-03 ENCOUNTER — Ambulatory Visit (INDEPENDENT_AMBULATORY_CARE_PROVIDER_SITE_OTHER): Payer: Managed Care, Other (non HMO) | Admitting: Family

## 2023-06-03 VITALS — BP 117/81 | HR 68 | Temp 98.1°F | Resp 14 | Ht 63.0 in | Wt 182.0 lb

## 2023-06-03 DIAGNOSIS — R6889 Other general symptoms and signs: Secondary | ICD-10-CM | POA: Diagnosis not present

## 2023-06-03 DIAGNOSIS — R112 Nausea with vomiting, unspecified: Secondary | ICD-10-CM | POA: Diagnosis not present

## 2023-06-03 DIAGNOSIS — R109 Unspecified abdominal pain: Secondary | ICD-10-CM

## 2023-06-03 MED ORDER — OMEPRAZOLE 20 MG PO CPDR
20.0000 mg | DELAYED_RELEASE_CAPSULE | Freq: Every day | ORAL | 0 refills | Status: DC
Start: 2023-06-03 — End: 2024-01-04

## 2023-06-03 MED ORDER — SUCRALFATE 1 GM/10ML PO SUSP
1.0000 g | Freq: Four times a day (QID) | ORAL | 1 refills | Status: DC
Start: 2023-06-03 — End: 2024-01-04

## 2023-06-03 NOTE — Progress Notes (Signed)
Pt is here for ER f/u   Complaining of diarrhea, generalized abdominal pain and sweats for X1 week   Pt states she started to feel better on Monday 6/10 but woke up this morning and symptoms returned

## 2023-06-17 ENCOUNTER — Encounter: Payer: Self-pay | Admitting: Physician Assistant

## 2023-07-01 ENCOUNTER — Other Ambulatory Visit: Payer: Self-pay | Admitting: Family

## 2023-07-01 DIAGNOSIS — F419 Anxiety disorder, unspecified: Secondary | ICD-10-CM

## 2023-07-01 DIAGNOSIS — F439 Reaction to severe stress, unspecified: Secondary | ICD-10-CM

## 2023-08-01 ENCOUNTER — Other Ambulatory Visit: Payer: Self-pay | Admitting: Family

## 2023-08-01 DIAGNOSIS — E119 Type 2 diabetes mellitus without complications: Secondary | ICD-10-CM

## 2023-08-03 ENCOUNTER — Other Ambulatory Visit: Payer: Self-pay

## 2023-08-03 DIAGNOSIS — E119 Type 2 diabetes mellitus without complications: Secondary | ICD-10-CM

## 2023-08-03 MED ORDER — SITAGLIPTIN PHOSPHATE 25 MG PO TABS
25.0000 mg | ORAL_TABLET | Freq: Every day | ORAL | 2 refills | Status: DC
Start: 2023-08-03 — End: 2023-12-16

## 2023-08-26 ENCOUNTER — Other Ambulatory Visit: Payer: Self-pay | Admitting: Family

## 2023-08-26 DIAGNOSIS — R52 Pain, unspecified: Secondary | ICD-10-CM

## 2023-08-28 ENCOUNTER — Ambulatory Visit (INDEPENDENT_AMBULATORY_CARE_PROVIDER_SITE_OTHER): Payer: Managed Care, Other (non HMO) | Admitting: Physician Assistant

## 2023-08-28 ENCOUNTER — Encounter: Payer: Self-pay | Admitting: Physician Assistant

## 2023-08-28 VITALS — BP 126/80 | HR 98 | Ht 63.0 in | Wt 187.0 lb

## 2023-08-28 DIAGNOSIS — K5900 Constipation, unspecified: Secondary | ICD-10-CM

## 2023-08-28 DIAGNOSIS — R112 Nausea with vomiting, unspecified: Secondary | ICD-10-CM

## 2023-08-28 NOTE — Patient Instructions (Addendum)
_______________________________________________________  If your blood pressure at your visit was 140/90 or greater, please contact your primary care physician to follow up on this.  If you are age 44 or younger, your body mass index should be between 19-25. Your Body mass index is 33.13 kg/m. If this is out of the aformentioned range listed, please consider follow up with your Primary Care Provider.  ________________________________________________________  The  GI providers would like to encourage you to use [facility] to communicate with providers for non-urgent requests or questions.  Due to long hold times on the telephone, sending your provider a message by [facility] may be a faster and more efficient way to get a response.  Please allow 48 business hours for a response.  Please remember that this is for non-urgent requests.  _______________________________________________________  USE Fiber supplement daily  USE [place] 1 - 2 at bedtime as needed.  USE Miralax 17 grams in 8 ounces of water daily or as needed.  Follow up here at age 18, to further discuss colonoscopy.  Thank you for entrusting me with your care and choosing [facility].  Amy Esterwood, PA-C

## 2023-08-30 ENCOUNTER — Encounter: Payer: Self-pay | Admitting: Physician Assistant

## 2023-08-31 NOTE — Progress Notes (Signed)
Agree with assessment/plan.  Raj Gupta, MD [facility] [phone]  

## 2023-09-21 ENCOUNTER — Other Ambulatory Visit: Payer: Self-pay

## 2023-09-21 ENCOUNTER — Other Ambulatory Visit: Payer: Self-pay | Admitting: Family

## 2023-09-21 DIAGNOSIS — F419 Anxiety disorder, unspecified: Secondary | ICD-10-CM

## 2023-09-21 DIAGNOSIS — F439 Reaction to severe stress, unspecified: Secondary | ICD-10-CM

## 2023-09-21 MED ORDER — HYDROXYZINE PAMOATE 25 MG PO CAPS
25.0000 mg | ORAL_CAPSULE | Freq: Three times a day (TID) | ORAL | 1 refills | Status: DC | PRN
Start: 2023-09-21 — End: 2024-01-04

## 2023-09-25 ENCOUNTER — Encounter (HOSPITAL_BASED_OUTPATIENT_CLINIC_OR_DEPARTMENT_OTHER): Payer: Self-pay | Admitting: Emergency Medicine

## 2023-09-25 ENCOUNTER — Other Ambulatory Visit: Payer: Self-pay

## 2023-09-25 ENCOUNTER — Emergency Department (HOSPITAL_BASED_OUTPATIENT_CLINIC_OR_DEPARTMENT_OTHER)
Admission: EM | Admit: 2023-09-25 | Discharge: 2023-09-25 | Disposition: A | Discharge status: Home / Self Care | Payer: Managed Care, Other (non HMO) | Attending: Emergency Medicine | Admitting: Emergency Medicine

## 2023-09-25 DIAGNOSIS — S0502XA Injury of conjunctiva and corneal abrasion without foreign body, left eye, initial encounter: Secondary | ICD-10-CM | POA: Diagnosis not present

## 2023-09-25 DIAGNOSIS — X58XXXA Exposure to other specified factors, initial encounter: Secondary | ICD-10-CM | POA: Insufficient documentation

## 2023-09-25 DIAGNOSIS — H5712 Ocular pain, left eye: Secondary | ICD-10-CM | POA: Diagnosis present

## 2023-09-25 MED ORDER — FLUORESCEIN SODIUM 1 MG OP STRP
1.0000 | ORAL_STRIP | Freq: Once | OPHTHALMIC | Status: AC
  Administered 2023-09-25: 1 via OPHTHALMIC
  Filled 2023-09-25: qty 1

## 2023-09-25 MED ORDER — KETOROLAC TROMETHAMINE 0.5 % OP SOLN: 1.0000 [drp] | Freq: Four times a day (QID) | OPHTHALMIC | 0 refills | Status: DC | PRN

## 2023-09-25 MED ORDER — CIPROFLOXACIN HCL 0.3 % OP SOLN
2.0000 [drp] | OPHTHALMIC | Status: DC
  Administered 2023-09-25: 2 [drp] via OPHTHALMIC
  Filled 2023-09-25: qty 2.5

## 2023-09-25 MED ORDER — TETRACAINE HCL 0.5 % OP SOLN
2.0000 [drp] | Freq: Once | OPHTHALMIC | Status: AC
  Administered 2023-09-25: 2 [drp] via OPHTHALMIC
  Filled 2023-09-25: qty 4

## 2023-09-25 NOTE — ED Provider Notes (Signed)
[facility] Provider Note   CSN: [id number] Arrival date & time: 09/25/23  0551     History  Chief Complaint  Patient presents with   Eye Problem    Ashley Casey is a 42 y.o. female.   Eye Problem Left eye pain and swelling and drainage.  Began yesterday around noon.  No known exposure but states she had been using some hand sanitizer.  Does wear cosmetic contacts.  No vision changes but does have some mild photophobia.     Home Medications Prior to Admission medications   Medication Sig Start Date End Date Taking? Authorizing Provider  ketorolac (ACULAR) 0.5 % ophthalmic solution Place 1 drop into both eyes 4 (four) times daily as needed (eye pain). 09/25/23  Yes Benjiman Core, MD  albuterol (VENTOLIN HFA) 108 (90 Base) MCG/ACT inhaler Inhale 1-2 puffs into the lungs every 6 (six) hours as needed for wheezing or shortness of breath. 04/06/23   Rema Fendt, NP  EPINEPHrine 0.3 mg/0.3 mL IJ SOAJ injection Inject 0.3 mg into the muscle as needed for anaphylaxis. 10/01/22   Harlene Salts A, PA-C  hydrOXYzine (VISTARIL) 25 MG capsule Take 1 capsule (25 mg total) by mouth every 8 (eight) hours as needed for anxiety. 09/21/23   Rema Fendt, NP  ibuprofen (ADVIL) 800 MG tablet TAKE 1 TABLET(800 MG) BY MOUTH EVERY 8 HOURS AS NEEDED FOR MODERATE PAIN Patient not taking: Reported on 08/28/2023 04/06/23   Rema Fendt, NP  Iron Combinations (CHROMAGEN) capsule Take 1 capsule by mouth daily.    [provider]  JANUVIA 25 MG tablet TAKE 1 TABLET BY MOUTH EVERY DAY 08/03/23   Rema Fendt, NP  meloxicam (MOBIC) 7.5 MG tablet Take 1 tablet (7.5 mg total) by mouth daily. 05/28/23   Rema Fendt, NP  methocarbamol (ROBAXIN) 500 MG tablet Take 1 tablet (500 mg total) by mouth every 8 (eight) hours as needed for muscle spasms. 04/06/23   Rema Fendt, NP  omeprazole (PRILOSEC) 20 MG capsule Take 1 capsule (20 mg total) by  mouth daily. Patient not taking: Reported on 08/28/2023 06/03/23 09/01/23  Rema Fendt, NP  Prenatal Vit-Fe Fumarate-FA (PRENATAL MULTIVITAMIN) TABS tablet Take 1 tablet by mouth daily at 12 noon. Patient not taking: Reported on 08/28/2023    [provider]  sitaGLIPtin (JANUVIA) 25 MG tablet Take 1 tablet (25 mg total) by mouth daily. 08/03/23   Rema Fendt, NP  sucralfate (CARAFATE) 1 GM/10ML suspension Take 10 mLs (1 g total) by mouth 4 (four) times daily. Patient not taking: Reported on 08/28/2023 06/03/23   Rema Fendt, NP  clonazePAM (KLONOPIN) 0.5 MG tablet Take 1 tablet (0.5 mg total) by mouth 2 (two) times daily as needed for anxiety. 05/16/19 02/15/20  Belinda Fisher, PA-C      Allergies    Food, Other, and Tegretol [carbamazepine]    Review of Systems   Review of Systems  Physical Exam Updated Vital Signs BP 107/80 (BP Location: Left Arm)   Pulse 79   Temp (!) 97.5 F (36.4 C) (Oral)   Resp 16   Ht 5\' 5"  (1.651 m)   Wt 84.8 kg   LMP 09/10/2023 (Exact Date)   SpO2 100%   BMI 31.12 kg/m  Physical Exam Vitals and nursing note reviewed.  Eyes:     Extraocular Movements: Extraocular movements intact.     Comments: Conjunctival injection.  Pupils somewhat constricted  bilaterally.  Does have injection of left eye with clear drainage.  Neurological:     Mental Status: She is alert.   Intraocular pressure 13 on the left.  On fluorescein exam did have some corneal uptake on the middle part of the cornea.  Potential abrasion.   ED Results / Procedures / Treatments   Labs (all labs ordered are listed, but only abnormal results are displayed) Labs Reviewed - No data to display  EKG None  Radiology No results found.  Procedures Procedures    Medications Ordered in ED Medications  ciprofloxacin (CILOXAN) 0.3 % ophthalmic solution 2 drop (2 drops Left Eye Given 09/25/23 0801)  tetracaine (PONTOCAINE) 0.5 % ophthalmic solution 2 drop (2 drops Left Eye Given  09/25/23 0732)  fluorescein ophthalmic strip 1 strip (1 strip Left Eye Given 09/25/23 0732)    ED Course/ Medical Decision Making/ A&P                                 Medical Decision Making Risk Prescription drug management.   Patient with left eye drainage and pain.  Differential diagnosis includes conjunctivitis, glaucoma, corneal abrasion.  Will check intraocular pressure.  Will examine with fluorescein.  Good intraocular pressure.  Does have fluorescein uptake.Will treat with some ketorolac drops and antibiotics since patient uses contacts.  Ophthalmology follow-up.  Appears stable for discharge home.        Final Clinical Impression(s) / ED Diagnoses Final diagnoses:  Abrasion of left cornea, initial encounter    Rx / DC Orders ED Discharge Orders          Ordered    ketorolac (ACULAR) 0.5 % ophthalmic solution  4 times daily PRN        09/25/23 0745              Benjiman Core, MD 09/25/23 [phone]

## 2023-09-25 NOTE — ED Triage Notes (Signed)
Left eye swelling, drainage and pain since yesterday around noon. Hurts to open eye. Denies injury/trauma/exposure.

## 2023-09-28 ENCOUNTER — Telehealth: Payer: Self-pay | Admitting: Family

## 2023-10-21 ENCOUNTER — Other Ambulatory Visit: Payer: Self-pay | Admitting: Family

## 2023-10-21 ENCOUNTER — Other Ambulatory Visit: Payer: Self-pay

## 2023-10-21 DIAGNOSIS — M791 Myalgia, unspecified site: Secondary | ICD-10-CM

## 2023-10-21 MED ORDER — METHOCARBAMOL 500 MG PO TABS
500.0000 mg | ORAL_TABLET | Freq: Three times a day (TID) | ORAL | 1 refills | Status: DC | PRN
Start: 2023-10-21 — End: 2024-01-04

## 2023-11-16 ENCOUNTER — Encounter: Payer: Self-pay | Admitting: Obstetrics

## 2023-11-16 ENCOUNTER — Ambulatory Visit: Payer: Managed Care, Other (non HMO)

## 2023-11-16 DIAGNOSIS — N926 Irregular menstruation, unspecified: Secondary | ICD-10-CM

## 2023-11-16 LAB — POCT URINE PREGNANCY: Preg Test, Ur: POSITIVE — AB

## 2023-11-16 NOTE — Progress Notes (Signed)
Cyndi Lennert A Ferrie here for a UPT. Pt had a positive upt at home. LMP is 10/08/23.     UPT in office Positive.    Reviewed medications and informed to start a PNV, if not already. Pt to follow up New OB visit.

## 2023-12-10 IMAGING — US US BREAST*R* LIMITED INC AXILLA
1 series · 7 of 7 positions shown · non-contrast
Comparison: Baseline screening mammogram dated 12/18/2021.

CLINICAL DATA: Patient returns today to evaluate a possible RIGHT
breast mass identified on a baseline screening mammogram.

EXAM:
DIGITAL DIAGNOSTIC UNILATERAL RIGHT MAMMOGRAM WITH TOMOSYNTHESIS AND
CAD; ULTRASOUND RIGHT BREAST LIMITED
TECHNIQUE: Right digital diagnostic mammography and breast tomosynthesis was
performed. The images were evaluated with computer-aided detection.;
Targeted ultrasound examination of the right breast was performed

[Series 1: us breast*right* limited inc axilla · 0.05mm/px · 7 of 7 slices shown]
[im 1/7]
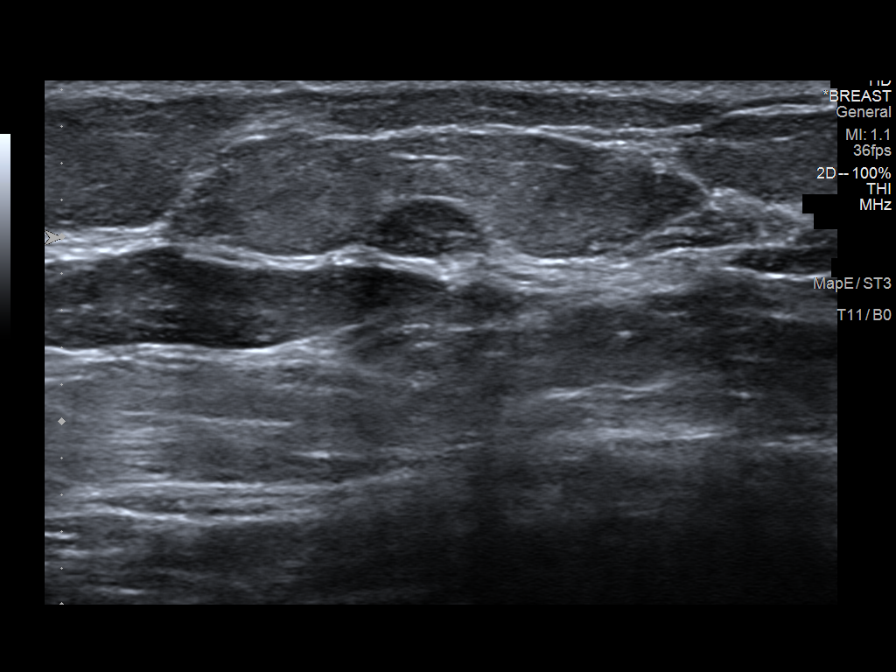
[im 2/7]
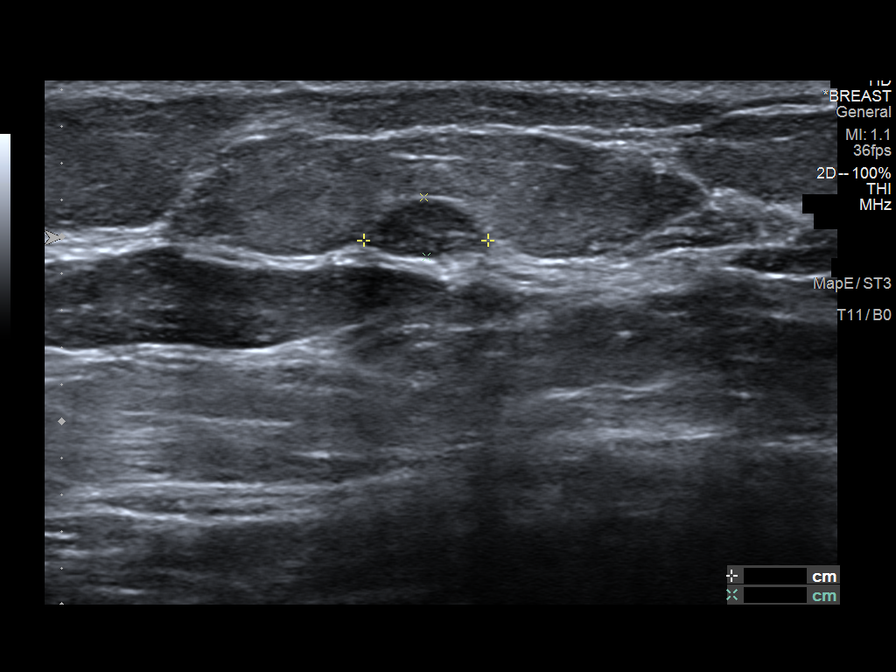
[im 3/7]
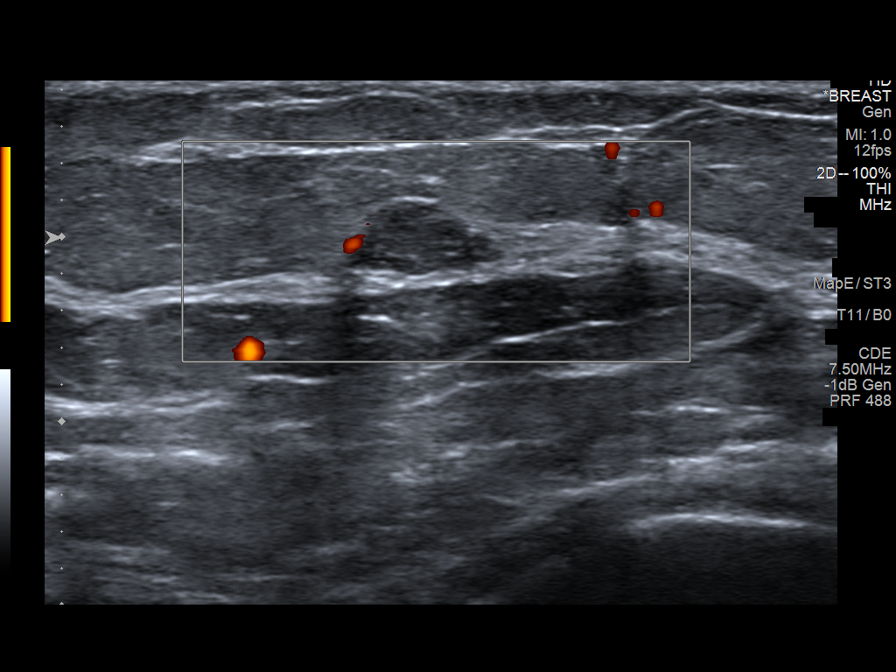
[im 4/7]
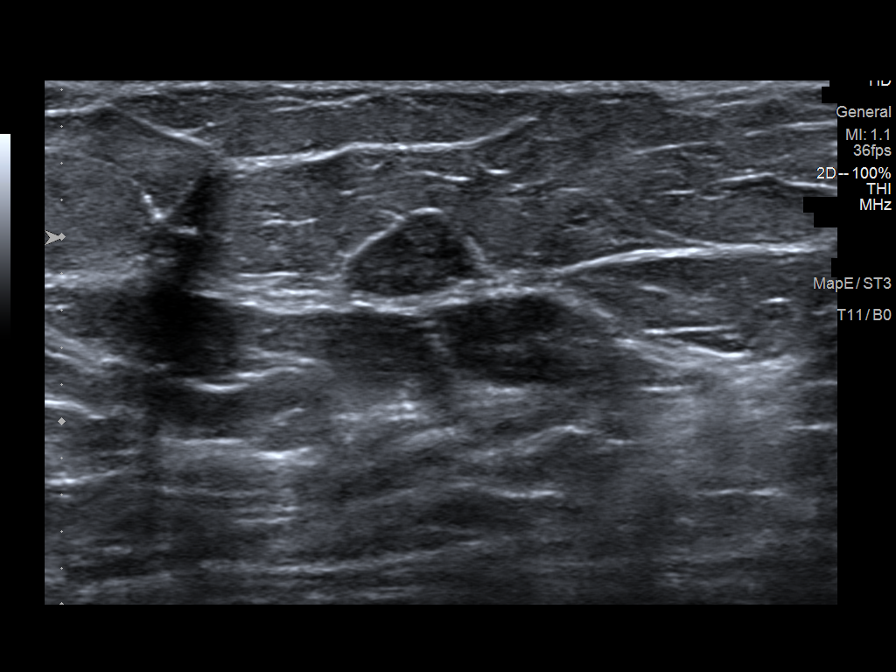
[im 5/7]
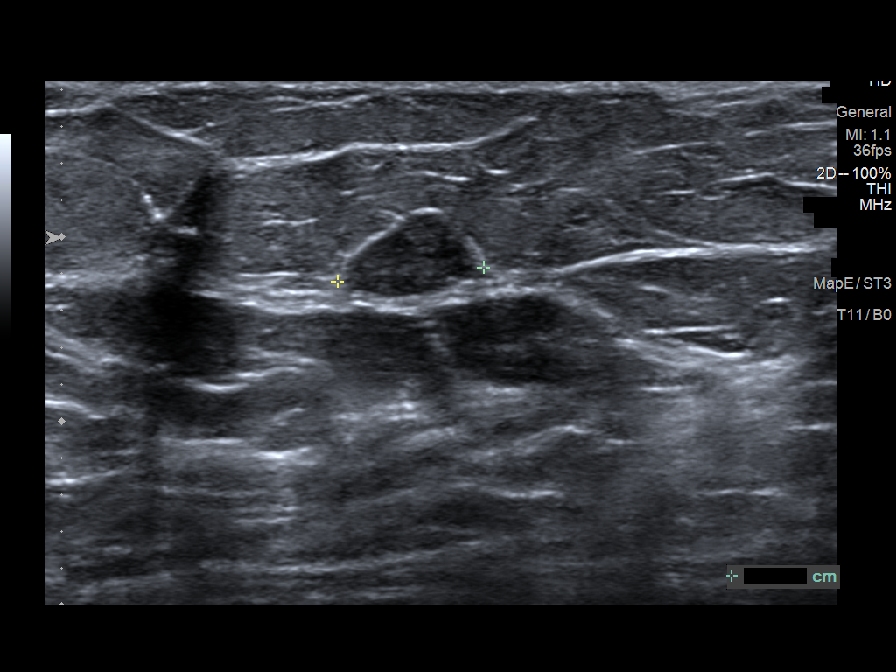
[im 6/7]
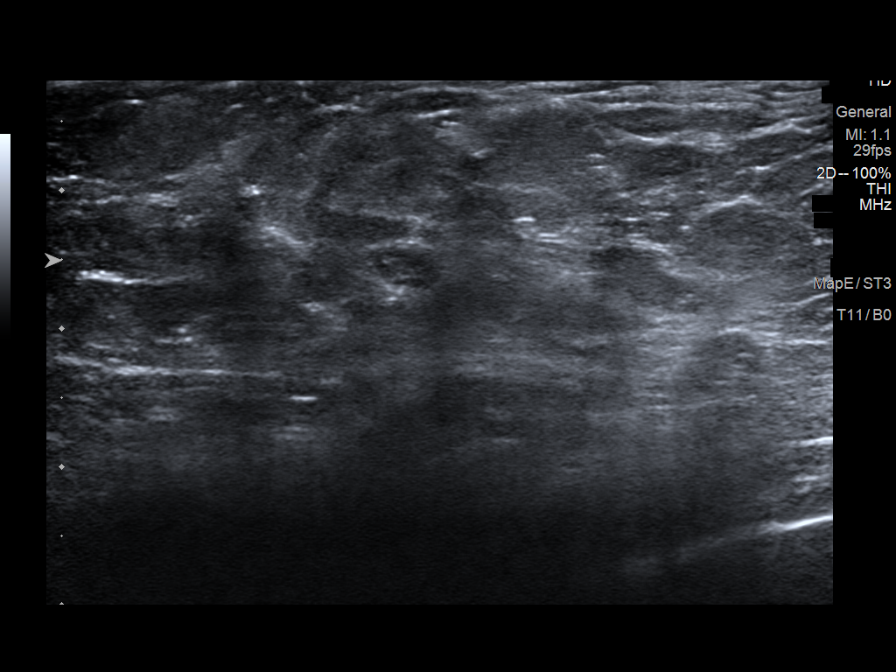
[im 7/7]
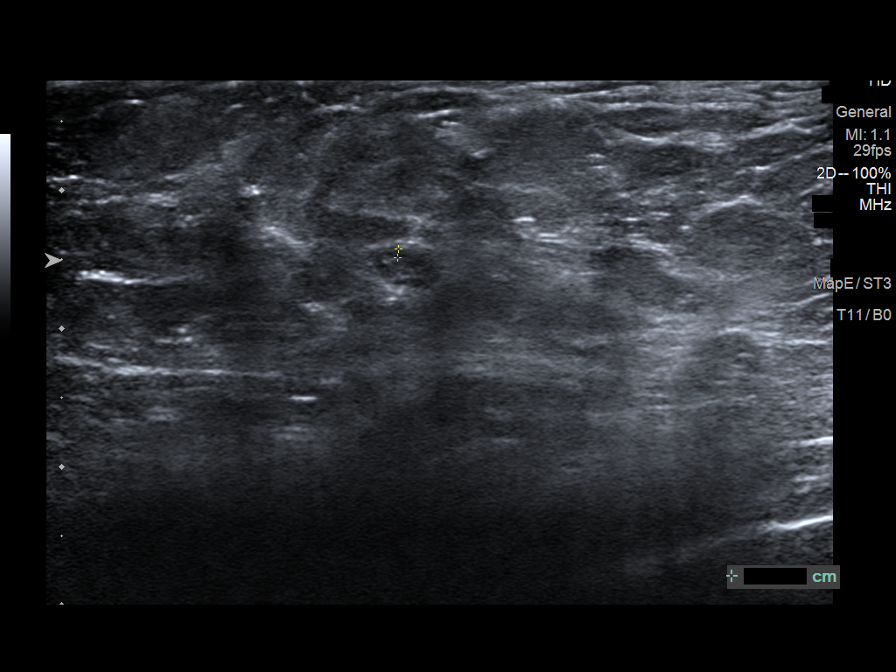

[7 of 7 positions shown; findings below may reference images not displayed]

ACR Breast Density Category b: There are scattered areas of
fibroglandular density.
FINDINGS: An oval circumscribed low-density mass is confirmed within the lower
inner quadrant of the RIGHT breast, 3-4 o'clock axis region,
measuring approximately 1 cm greatest dimension.

Targeted ultrasound is performed, showing an oval circumscribed
hypoechoic mass in the RIGHT breast at the 5 o'clock axis, 5 cm from
the nipple, with partially angular margins, measuring 8 x 3 x 7 mm,
corresponding to the mammographic finding.

RIGHT axilla was evaluated with ultrasound showing no enlarged or
morphologically abnormal lymph nodes.
IMPRESSION: Oval circumscribed hypoechoic mass in the RIGHT breast at the 5
o'clock axis, 5 cm from the nipple, with partially angular margins,
measuring 8 mm, corresponding to the mammographic finding. This may
represent a benign fibroadenoma. Ultrasound-guided biopsy is
recommended to exclude malignancy.

RECOMMENDATION:
Ultrasound-guided biopsy for the RIGHT breast mass at the 5 o'clock
axis.

Ultrasound-guided biopsy is scheduled on [DATE]st.

I have discussed the findings and recommendations with the patient.
If applicable, a reminder letter will be sent to the patient
regarding the next appointment.

BI-RADS CATEGORY  4: Suspicious.

## 2023-12-10 IMAGING — MG MM DIGITAL DIAGNOSTIC UNILAT*R* W/ TOMO W/ CAD
6 series · 6 of 18 positions shown · non-contrast
Comparison: Baseline screening mammogram dated 12/18/2021.

CLINICAL DATA: Patient returns today to evaluate a possible RIGHT
breast mass identified on a baseline screening mammogram.

EXAM:
DIGITAL DIAGNOSTIC UNILATERAL RIGHT MAMMOGRAM WITH TOMOSYNTHESIS AND
CAD; ULTRASOUND RIGHT BREAST LIMITED
TECHNIQUE: Right digital diagnostic mammography and breast tomosynthesis was
performed. The images were evaluated with computer-aided detection.;
Targeted ultrasound examination of the right breast was performed

[R CC synth-2D]
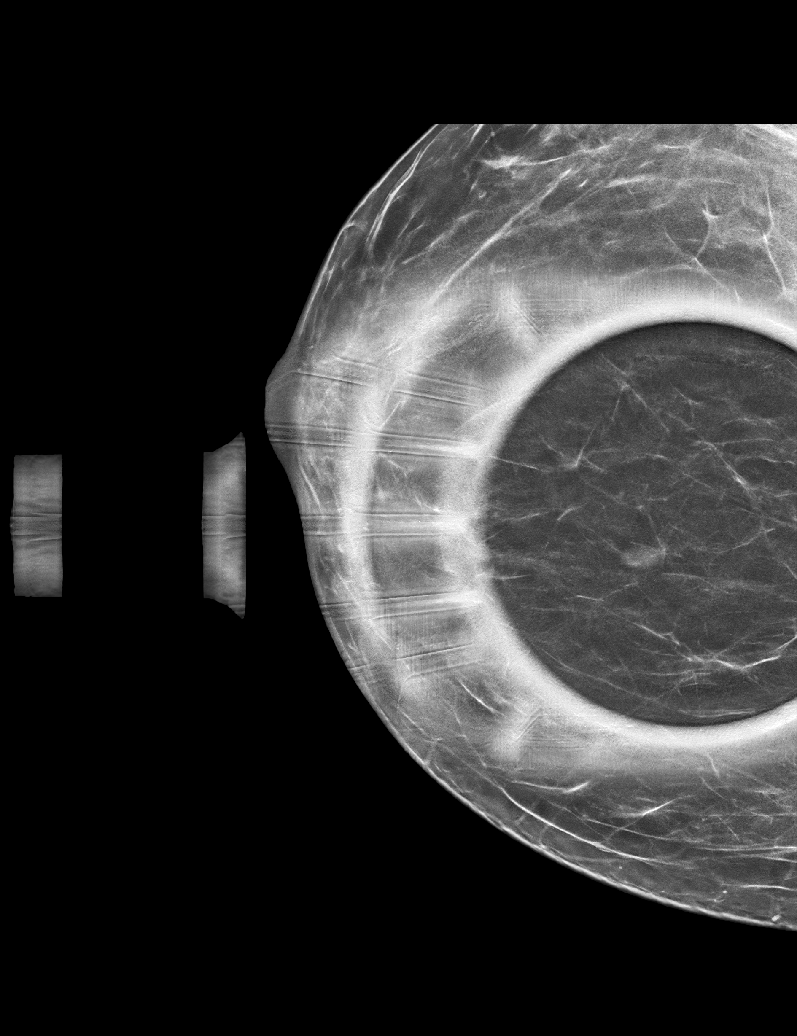

[R MLO synth-2D (1 of 2)]
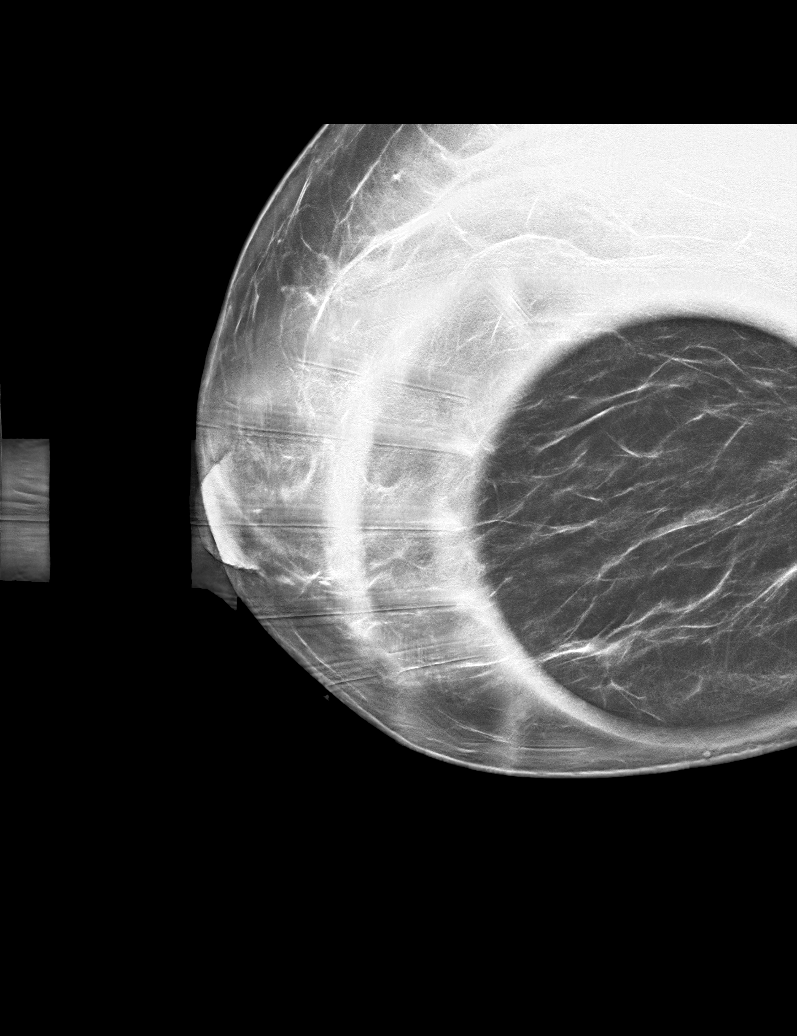

[R MLO synth-2D (2 of 2)]
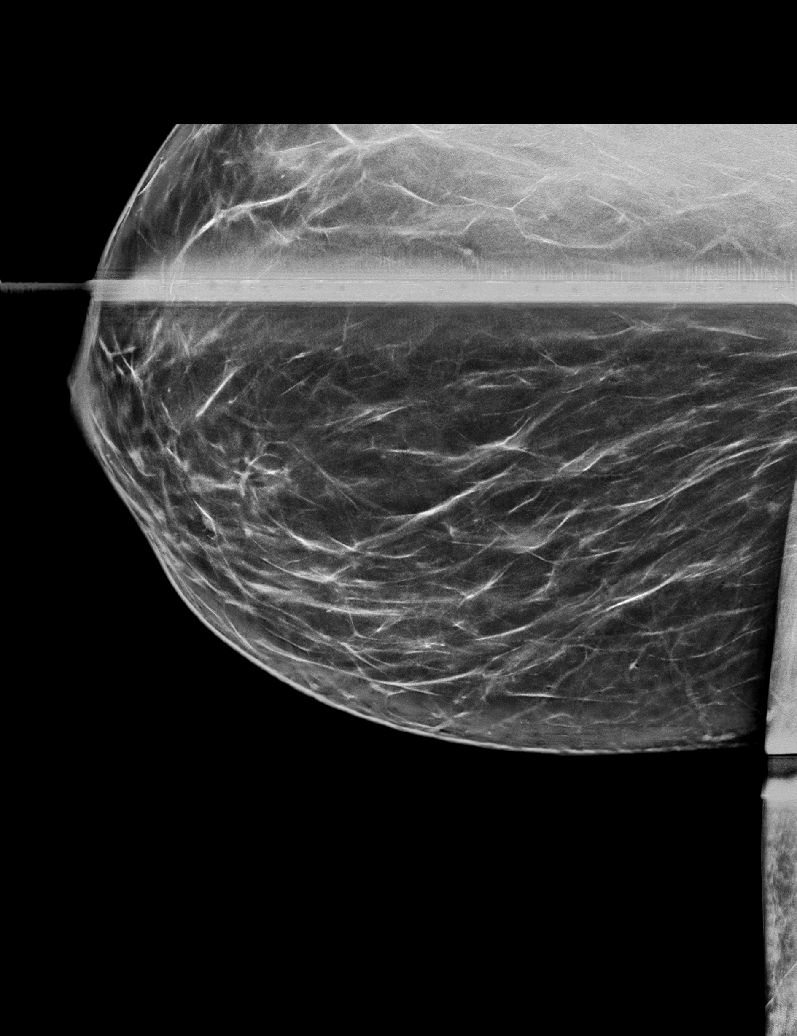

[R MLO tomo (1 of 2) · tomo slice 34/67.0]
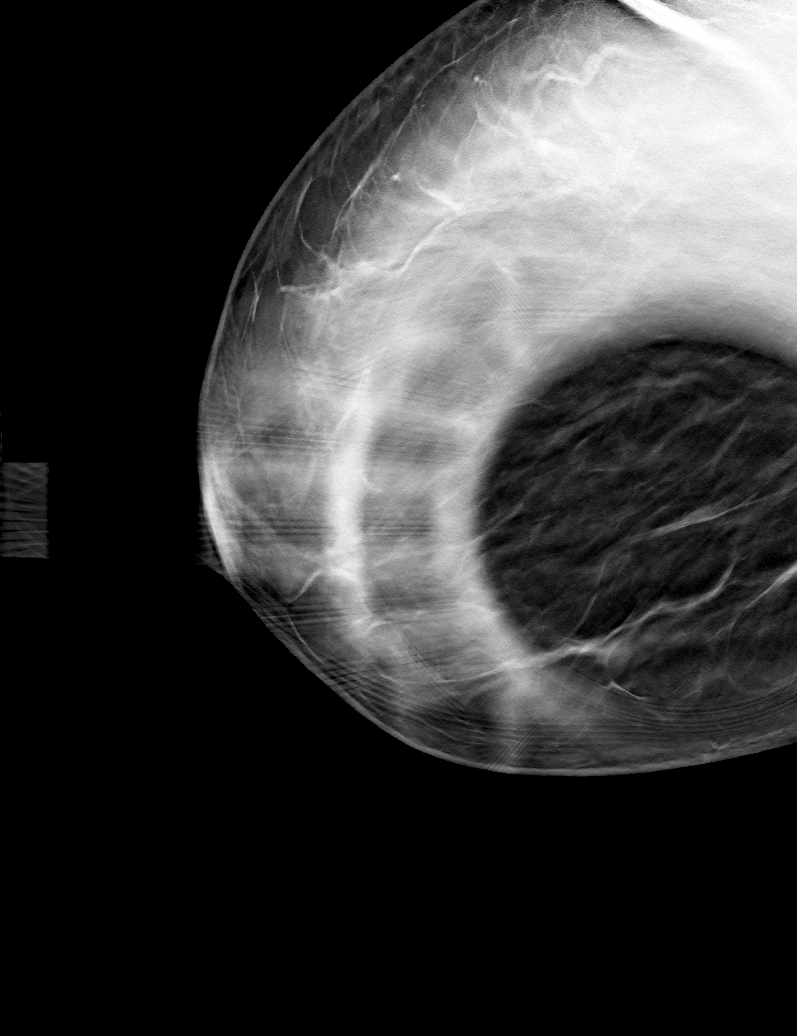

[R MLO tomo (2 of 2) · tomo slice 37/73.0]
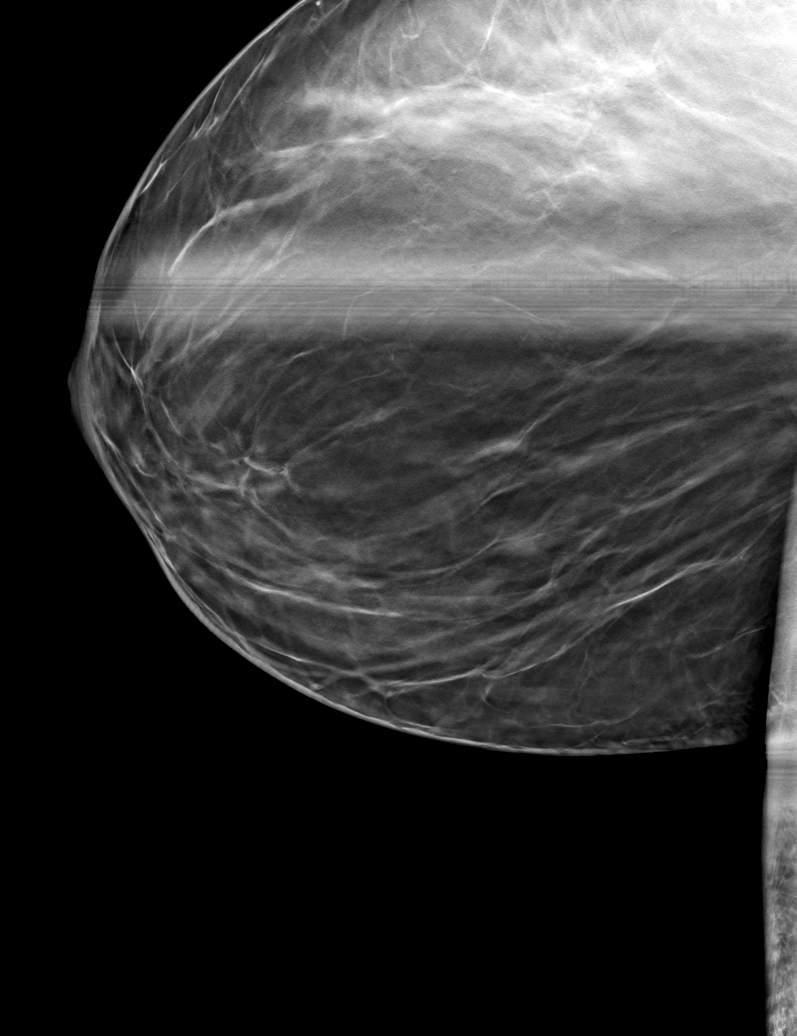

[R CC tomo · tomo slice 29/57.0]
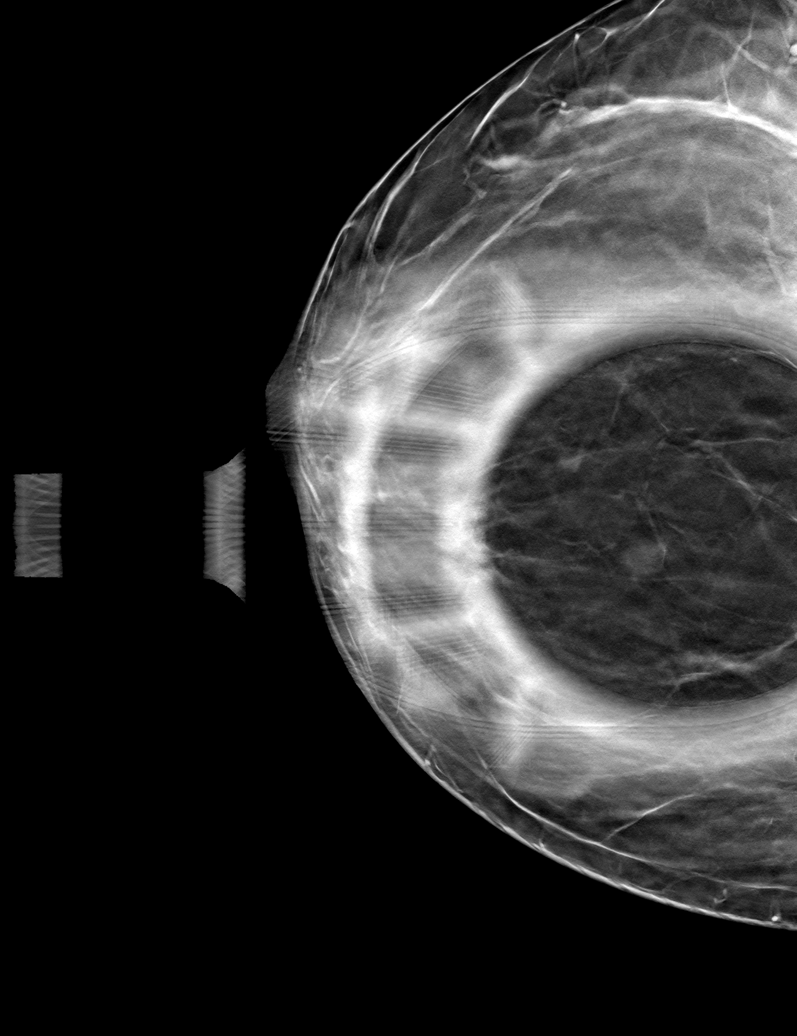

[6 of 18 positions shown; findings below may reference images not displayed]

ACR Breast Density Category b: There are scattered areas of
fibroglandular density.
FINDINGS: An oval circumscribed low-density mass is confirmed within the lower
inner quadrant of the RIGHT breast, 3-4 o'clock axis region,
measuring approximately 1 cm greatest dimension.

Targeted ultrasound is performed, showing an oval circumscribed
hypoechoic mass in the RIGHT breast at the 5 o'clock axis, 5 cm from
the nipple, with partially angular margins, measuring 8 x 3 x 7 mm,
corresponding to the mammographic finding.

RIGHT axilla was evaluated with ultrasound showing no enlarged or
morphologically abnormal lymph nodes.
IMPRESSION: Oval circumscribed hypoechoic mass in the RIGHT breast at the 5
o'clock axis, 5 cm from the nipple, with partially angular margins,
measuring 8 mm, corresponding to the mammographic finding. This may
represent a benign fibroadenoma. Ultrasound-guided biopsy is
recommended to exclude malignancy.

RECOMMENDATION:
Ultrasound-guided biopsy for the RIGHT breast mass at the 5 o'clock
axis.

Ultrasound-guided biopsy is scheduled on [DATE]st.

I have discussed the findings and recommendations with the patient.
If applicable, a reminder letter will be sent to the patient
regarding the next appointment.

BI-RADS CATEGORY  4: Suspicious.

## 2023-12-16 ENCOUNTER — Inpatient Hospital Stay (HOSPITAL_COMMUNITY)
Admission: AD | Admit: 2023-12-16 | Discharge: 2023-12-16 | Disposition: A | Discharge status: Home / Self Care | Payer: Managed Care, Other (non HMO) | Attending: Obstetrics and Gynecology | Admitting: Obstetrics and Gynecology

## 2023-12-16 ENCOUNTER — Encounter (HOSPITAL_COMMUNITY): Payer: Self-pay

## 2023-12-16 ENCOUNTER — Inpatient Hospital Stay (HOSPITAL_COMMUNITY): Payer: Managed Care, Other (non HMO)

## 2023-12-16 DIAGNOSIS — B9689 Other specified bacterial agents as the cause of diseases classified elsewhere: Secondary | ICD-10-CM | POA: Insufficient documentation

## 2023-12-16 DIAGNOSIS — O208 Other hemorrhage in early pregnancy: Secondary | ICD-10-CM | POA: Insufficient documentation

## 2023-12-16 DIAGNOSIS — O23591 Infection of other part of genital tract in pregnancy, first trimester: Secondary | ICD-10-CM | POA: Insufficient documentation

## 2023-12-16 DIAGNOSIS — Z3A01 Less than 8 weeks gestation of pregnancy: Secondary | ICD-10-CM | POA: Insufficient documentation

## 2023-12-16 DIAGNOSIS — O209 Hemorrhage in early pregnancy, unspecified: Secondary | ICD-10-CM

## 2023-12-16 LAB — URINALYSIS, ROUTINE W REFLEX MICROSCOPIC
Bilirubin Urine: NEGATIVE
Glucose, UA: >=500 mg/dL — AB
Ketones, ur: NEGATIVE mg/dL
Leukocytes,Ua: NEGATIVE
Nitrite: NEGATIVE
Protein, ur: NEGATIVE mg/dL
Specific Gravity, Urine: 1.025 (ref 1.005–1.030)
pH: 8 (ref 5.0–8.0)

## 2023-12-16 LAB — CBC
HCT: 39.9 % (ref 36.0–46.0)
Hemoglobin: 12.6 g/dL (ref 12.0–15.0)
MCH: 26.5 pg (ref 26.0–34.0)
MCHC: 31.6 g/dL (ref 30.0–36.0)
MCV: 83.8 fL (ref 80.0–100.0)
Platelets: 266 10*3/uL (ref 150–400)
RBC: 4.76 MIL/uL (ref 3.87–5.11)
RDW: 13.1 % (ref 11.5–15.5)
WBC: 9.2 10*3/uL (ref 4.0–10.5)
nRBC: 0 % (ref 0.0–0.2)

## 2023-12-16 LAB — COMPREHENSIVE METABOLIC PANEL
ALT: 15 U/L (ref 0–44)
AST: 18 U/L (ref 15–41)
Albumin: 3.4 g/dL — ABNORMAL LOW (ref 3.5–5.0)
Alkaline Phosphatase: 67 U/L (ref 38–126)
Anion gap: 7 (ref 5–15)
BUN: 8 mg/dL (ref 6–20)
CO2: 22 mmol/L (ref 22–32)
Calcium: 9.5 mg/dL (ref 8.9–10.3)
Chloride: 102 mmol/L (ref 98–111)
Creatinine, Ser: 0.93 mg/dL (ref 0.44–1.00)
GFR, Estimated: >60 mL/min (ref >60)
Glucose, Bld: 108 mg/dL — ABNORMAL HIGH (ref 70–99)
Potassium: 3.6 mmol/L (ref 3.5–5.1)
Sodium: 131 mmol/L — ABNORMAL LOW (ref 135–145)
Total Bilirubin: 0.4 mg/dL (ref <1.2)
Total Protein: 7.2 g/dL (ref 6.5–8.1)

## 2023-12-16 LAB — HCG, QUANTITATIVE, PREGNANCY: hCG, Beta Chain, Quant, S: 18143 m[IU]/mL — ABNORMAL HIGH (ref <5)

## 2023-12-16 LAB — WET PREP, GENITAL
Sperm: NONE SEEN
Trich, Wet Prep: NONE SEEN
WBC, Wet Prep HPF POC: <10 (ref <10)
Yeast Wet Prep HPF POC: NONE SEEN

## 2023-12-16 MED ORDER — METRONIDAZOLE 500 MG PO TABS: 500.0000 mg | ORAL_TABLET | Freq: Two times a day (BID) | ORAL | 0 refills | Status: DC

## 2023-12-16 NOTE — Discharge Instructions (Signed)
You were seen today in the MAU for cramping and bleeding in early pregnancy. Your ultrasound showed an early pregnancy inside your uterus. The dating for this did not match your dating by your LMP. It did show a small bleed, called a subchorionic hemorrhage. You also had bacterial vaginosis based on your vaginal swabs. We are sending an antibiotic to treat this.   It is recommended that you follow up with an ultrasound in 7-10 days

## 2023-12-16 NOTE — MAU Provider Note (Signed)
History     CSN: [id number]  Arrival date and time: 12/16/23 0841   None     Chief Complaint  Patient presents with   Vaginal Bleeding   Ashley Casey is a 42 y.o. G13 P7047 at 9.6 who receives care at [facility].  She presents today for Vaginal bleeding with cramping since Monday, 12/13/23. She states the bleeding has slightly increased, to more than spotting and the cramping has been constant. She states that the cramping is mild.   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     CHOLECYSTECTOMY  8/29/121   THERAPEUTIC ABORTION     WISDOM TOOTH EXTRACTION      Family History  Problem Relation Age of Onset   Other Mother        sickle cell anemia   Diabetes Mother    Sickle cell anemia Mother    Anemia Sister    Arthritis Brother    Asthma Brother    Diabetes Maternal Grandmother    Diabetes Paternal Grandmother    Colon cancer Neg Hx    Stomach cancer Neg Hx    Esophageal cancer Neg Hx     Social History   Tobacco Use   Smoking status: Never    Passive exposure: Never   Smokeless tobacco: Never  Vaping Use   Vaping status: Never Used  Substance Use Topics   Alcohol use: No   Drug use: No    Allergies:  Allergies  Allergen Reactions   Food Anaphylaxis and Other (See Comments)    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     Tegretol  [Carbamazepine] Other (See Comments)    Reaction:  Seizures     No medications prior to admission.    Review of Systems  Constitutional:  Negative for chills, fatigue, fever and unexpected weight change.  Respiratory:  Negative for cough and shortness of breath.   Cardiovascular:  Negative for chest pain and palpitations.  Gastrointestinal:  Positive for abdominal pain. Negative for constipation, diarrhea, nausea and vomiting.       Minimal cramping since Monday   Genitourinary:  Positive for vaginal bleeding. Negative for difficulty urinating, flank pain, frequency and urgency.   Physical Exam   Blood pressure 106/73, pulse 87, temperature 98 F (36.7 C), temperature source Oral, resp. rate 16, height 5\' 5"  (1.651 m), weight 87.5 kg, last menstrual period 10/08/2023, SpO2 97%.  Physical Exam Vitals reviewed.  Constitutional:      General: She is not  in acute distress.    Appearance: Normal appearance. She is not ill-appearing, toxic-appearing or diaphoretic.  HENT:     Head: Normocephalic.  Cardiovascular:     Rate and Rhythm: Normal rate.     Pulses: Normal pulses.  Pulmonary:     Effort: Pulmonary effort is normal.  Skin:    General: Skin is warm and dry.     Capillary Refill: Capillary refill takes less than 2 seconds.  Neurological:     Mental Status: She is alert and oriented to person, place, and time.  Psychiatric:        Mood and Affect: Mood normal.        Behavior: Behavior normal.        Thought Content: Thought content normal.        Judgment: Judgment normal.       MAU Course   Results for orders placed or performed during the hospital encounter of 12/16/23 (from the past 24 hours)  Wet prep, genital     Status: Abnormal   Collection Time: 12/16/23  9:09 AM   Specimen: Vaginal  Result Value Ref Range   Yeast Wet Prep HPF POC NONE SEEN NONE SEEN   Trich, Wet Prep NONE SEEN NONE SEEN   Clue Cells Wet Prep HPF POC PRESENT (A) NONE SEEN   WBC, Wet  Prep HPF POC <10 <10   Sperm NONE SEEN   Urinalysis, Routine w reflex microscopic -Urine, Clean Catch     Status: Abnormal   Collection Time: 12/16/23  9:12 AM  Result Value Ref Range   Color, Urine YELLOW YELLOW   APPearance CLEAR CLEAR   Specific Gravity, Urine 1.025 1.005 - 1.030   pH 8.0 5.0 - 8.0   Glucose, UA >=500 (A) NEGATIVE mg/dL   Hgb urine dipstick MODERATE (A) NEGATIVE   Bilirubin Urine NEGATIVE NEGATIVE   Ketones, ur NEGATIVE NEGATIVE mg/dL   Protein, ur NEGATIVE NEGATIVE mg/dL   Nitrite NEGATIVE NEGATIVE   Leukocytes,Ua NEGATIVE NEGATIVE   RBC / HPF 0-5 0 - 5 RBC/hpf   WBC, UA 0-5 0 - 5 WBC/hpf   Bacteria, UA RARE (A) NONE SEEN   Squamous Epithelial / HPF 0-5 0 - 5 /HPF  CBC     Status: None   Collection Time: 12/16/23 10:03 AM  Result Value Ref Range   WBC 9.2 4.0 - 10.5 K/uL   RBC 4.76 3.87 - 5.11 MIL/uL   Hemoglobin 12.6 12.0 - 15.0 g/dL   HCT 96.2 95.2 - 84.1 %   MCV 83.8 80.0 - 100.0 fL   MCH 26.5 26.0 - 34.0 pg   MCHC 31.6 30.0 - 36.0 g/dL   RDW 32.4 40.1 - 02.7 %   Platelets 266 150 - 400 K/uL   nRBC 0.0 0.0 - 0.2 %  hCG, quantitative, pregnancy     Status: Abnormal   Collection Time: 12/16/23 10:03 AM  Result Value Ref Range   hCG, Beta Chain, Quant, S 18,143 (H) <5 mIU/mL  Comprehensive metabolic panel     Status: Abnormal   Collection Time: 12/16/23 10:03 AM  Result Value Ref Range   Sodium 131 (L) 135 - 145 mmol/L   Potassium 3.6 3.5 - 5.1 mmol/L   Chloride 102 98 - 111 mmol/L   CO2 22 22 - 32 mmol/L   Glucose, Bld 108 (H) 70 - 99 mg/dL   BUN 8 6 - 20 mg/dL   Creatinine, Ser 2.53 0.44 - 1.00 mg/dL   Calcium  9.5 8.9 - 10.3 mg/dL   Total Protein 7.2 6.5 - 8.1 g/dL   Albumin 3.4 (L) 3.5 - 5.0 g/dL   AST 18 15 - 41 U/L   ALT 15 0 - 44 U/L   Alkaline Phosphatase 67 38 - 126 U/L   Total Bilirubin 0.4 <1.2 mg/dL   GFR, Estimated >40 >98 mL/min   Anion gap 7 5 - 15   US OB LESS THAN 14 WEEKS WITH OB TRANSVAGINAL Result Date:  12/16/2023 CLINICAL DATA:  Positive pregnancy test.  Vaginal bleeding. EXAM: OBSTETRIC <14 WK [place] AND TRANSVAGINAL OB US TECHNIQUE: Both transabdominal and transvaginal ultrasound examinations were performed for complete evaluation of the gestation as well as the maternal uterus, adnexal regions, and pelvic cul-de-sac. Transvaginal technique was performed to assess early pregnancy. COMPARISON:  None for this pregnancy. FINDINGS: Intrauterine gestational sac: Single. The gestational sac is slightly irregular. Yolk sac:  Present.  3.5 mm Embryo:  Not visualized Cardiac Activity: Not visualized MSD: 10.8 mm   5 w   6 d Subchorionic hemorrhage:  Small Maternal uterus/adnexae: Left corpus luteal cyst noted. Uterus and adnexa are otherwise within normal limits. No free fluid is present. IMPRESSION: 1. Slight irregularity of is a single gestational sac is still within normal limits. 2. Mean sac diameter of 10.8 mm correlates with an estimated fetal age of 5 weeks and 6 days. 3. Probable early intrauterine gestational sac, but no yolk sac, fetal pole, or cardiac activity yet visualized. Recommend follow-up quantitative B-HCG levels and follow-up US in 14 days to assess viability. This recommendation follows SRU consensus guidelines: Diagnostic Criteria for Nonviable Pregnancy Early in the First Trimester. Malva Limes Med 2013; 119:1478-29. Electronically Signed   By: Marin Roberts M.D.   On: 12/16/2023 10:06    MDM PE Labs: UA, CMP, CBC, betaHcG, [place] Patient at an early gestational age by LMP with positive urine pregnancy test but no viability scan or Beta quant.  Reviewed [place] report with Dr. Crissie Reese.   Assessment and Plan  42y  G12 P7  SIUP at 5w 6d by measurements, inconsistent with dates of 9.6 weeks by LMP.  - [place] workup ordered.   - Exam findings discussed. [place] images revealed measurements inconsistent with dates 5w 6d, with a small subchorionic hemorrhage. Discussed possibility of nonviable pregnancy,  and counseled patient on precautions for SAB.   -  Bacterial vaginosis present on wet prep. Sent Flagyl Rx to [facility] due to holiday hours. Patient prefers PO Flagyl. - Recommendation for follow up US in 7-10 days per SRU guidelines counseled. Patient able to verbalize understanding. Prefers [facility] location for [place]. Sent message to office to schedule.  - Strict bleeding precautions counseled.  - Return to MAU PRN. - Discharged in stable condition.    Richardson Landry MSN, CNM 12/16/2023, 11:24 AM

## 2023-12-16 NOTE — MAU Note (Signed)
Ashley Casey is a 42 y.o. at [redacted]w[redacted]d here in MAU reporting: has been spotting since Monday Night.  Mainly sees when she wipes, has been off and onl. Is also having cramping, slight today in lower abd.  Comes and goes.   Talked to office on Tues morning, was told to call or come up here if this got worse.   Onset of complaint: Monday Pain score: slight Vitals:   12/16/23 0857  BP: 106/73  Pulse: 87  Resp: 16  Temp: 98 F (36.7 C)  SpO2: 97%      Lab orders placed from triage:  urine and vag swabs

## 2023-12-17 ENCOUNTER — Other Ambulatory Visit: Payer: Self-pay | Admitting: Emergency Medicine

## 2023-12-17 ENCOUNTER — Encounter: Payer: Self-pay | Admitting: Emergency Medicine

## 2023-12-17 ENCOUNTER — Telehealth: Payer: Self-pay | Admitting: Emergency Medicine

## 2023-12-17 DIAGNOSIS — O209 Hemorrhage in early pregnancy, unspecified: Secondary | ICD-10-CM

## 2023-12-17 LAB — GC/CHLAMYDIA PROBE AMP (~~LOC~~) NOT AT ARMC
Chlamydia: NEGATIVE
Comment: NEGATIVE
Comment: NORMAL
Neisseria Gonorrhea: NEGATIVE

## 2023-12-17 NOTE — Telephone Encounter (Signed)
Incoming call from pt with concerns for miscarriage.  Was seen in hospital on 12/25 for same concern.  Pt counseled to continue to monitor symptoms, report bleeding and pain. Follow up HCG on 12/27. Order placed for follow up US.

## 2023-12-18 ENCOUNTER — Other Ambulatory Visit: Payer: Managed Care, Other (non HMO)

## 2023-12-18 DIAGNOSIS — Z3480 Encounter for supervision of other normal pregnancy, unspecified trimester: Secondary | ICD-10-CM

## 2023-12-19 ENCOUNTER — Inpatient Hospital Stay (HOSPITAL_COMMUNITY): Payer: Managed Care, Other (non HMO)

## 2023-12-19 ENCOUNTER — Inpatient Hospital Stay (HOSPITAL_COMMUNITY)
Admission: AD | Admit: 2023-12-19 | Discharge: 2023-12-19 | Disposition: A | Discharge status: Home / Self Care | Payer: Managed Care, Other (non HMO) | Attending: Obstetrics & Gynecology | Admitting: Obstetrics & Gynecology

## 2023-12-19 DIAGNOSIS — Z6790 Unspecified blood type, Rh positive: Secondary | ICD-10-CM | POA: Diagnosis not present

## 2023-12-19 DIAGNOSIS — Z3A10 10 weeks gestation of pregnancy: Secondary | ICD-10-CM | POA: Diagnosis not present

## 2023-12-19 DIAGNOSIS — O039 Complete or unspecified spontaneous abortion without complication: Secondary | ICD-10-CM | POA: Diagnosis not present

## 2023-12-19 LAB — CBC
HCT: 38.1 % (ref 36.0–46.0)
Hemoglobin: 11.9 g/dL — ABNORMAL LOW (ref 12.0–15.0)
MCH: 26.2 pg (ref 26.0–34.0)
MCHC: 31.2 g/dL (ref 30.0–36.0)
MCV: 83.7 fL (ref 80.0–100.0)
Platelets: 252 10*3/uL (ref 150–400)
RBC: 4.55 MIL/uL (ref 3.87–5.11)
RDW: 13.1 % (ref 11.5–15.5)
WBC: 11.1 10*3/uL — ABNORMAL HIGH (ref 4.0–10.5)
nRBC: 0 % (ref 0.0–0.2)

## 2023-12-19 LAB — TYPE AND SCREEN
ABO/RH(D): O POS
Antibody Screen: NEGATIVE

## 2023-12-19 LAB — BETA HCG QUANT (REF LAB): hCG Quant: 13081 m[IU]/mL

## 2023-12-19 MED ORDER — OXYCODONE-ACETAMINOPHEN 5-325 MG PO TABS
2.0000 | ORAL_TABLET | Freq: Once | ORAL | Status: AC
  Administered 2023-12-19: 2 via ORAL
  Filled 2023-12-19: qty 2

## 2023-12-19 MED ORDER — TRAMADOL HCL 50 MG PO TABS: 50.0000 mg | ORAL_TABLET | Freq: Four times a day (QID) | ORAL | 0 refills | Status: DC | PRN

## 2023-12-19 NOTE — MAU Provider Note (Signed)
Chief Complaint: Vaginal Bleeding   Event Date/Time   First Provider Initiated Contact with Patient 12/19/23 0042      SUBJECTIVE HPI: Ashley Casey is a 42 y.o. [id number] at [redacted]w[redacted]d by LMP who presents to maternity admissions reporting heavy vaginal bleeding with clots and cramping abdominal pain.    HPI  Past Medical History:  Diagnosis Date   Abnormal Pap smear    Anxiety    in past   Bell's palsy    after 5th pregnancy never resolved   Cholelithiasis    Fatigue    History of shingles    IBS (irritable bowel syndrome)    Neuromuscular disorder (HCC)    Past Surgical History:  Procedure Laterality Date   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     CHOLECYSTECTOMY  8/29/121   THERAPEUTIC ABORTION     WISDOM TOOTH EXTRACTION     Social History   Socioeconomic History   Marital status: Married    Spouse name: Not on file   Number of children: 7   Years of education: Not on file   Highest education level: Associate degree: academic program  Occupational History   Occupation: International aid/development worker  Tobacco Use   Smoking status: Never    Passive exposure: Never   Smokeless tobacco: Never  Vaping Use   Vaping status: Never Used  Substance and Sexual Activity   Alcohol use: No   Drug use: No   Sexual activity: Yes    Partners: Male    Birth control/protection: None  Other Topics Concern   Not on file  Social History Narrative   Not on file   Social Drivers of Health   Financial Resource Strain: Medium Risk (04/06/2023)   Overall Financial Resource Strain (CARDIA)    Difficulty of Paying Living Expenses: Somewhat hard  Food Insecurity: Food Insecurity Present (04/06/2023)   Hunger Vital Sign    Worried About Running Out of Food in the Last Year: Sometimes true    Ran Out of Food in the Last Year: Sometimes true  Transportation Needs: No Transportation Needs (04/06/2023)   PRAPARE - Administrator, Civil Service (Medical): No    Lack of Transportation  (Non-Medical): No  Physical Activity: Sufficiently Active (04/06/2023)   Exercise Vital Sign    Days of Exercise per Week: 5 days    Minutes of Exercise per Session: 90 min  Stress: Stress Concern Present (04/06/2023)   [employer] of Occupational Health - Occupational Stress Questionnaire    Feeling of Stress : Rather much  Social Connections: Moderately Isolated (04/06/2023)   Social Connection and Isolation Panel [NHANES]    Frequency of Communication with Friends and Family: More than three times a week    Frequency of Social Gatherings with Friends and Family: Twice a week    Attends Religious Services: Never    Database administrator or Organizations: No    Attends Engineer, structural: Not on file    Marital Status: Married  Catering manager Violence: Not on file   No current facility-administered medications on file prior to encounter.   Current Outpatient Medications on File Prior to Encounter  Medication Sig Dispense Refill   albuterol (VENTOLIN HFA) 108 (90 Base) MCG/ACT inhaler Inhale 1-2 puffs into the lungs every 6 (six) hours as needed for wheezing or shortness of breath. 8 g 0   EPINEPHrine 0.3 mg/0.3 mL IJ SOAJ injection Inject 0.3 mg into the muscle as needed for anaphylaxis. 1  each 1   hydrOXYzine (VISTARIL) 25 MG capsule Take 1 capsule (25 mg total) by mouth every 8 (eight) hours as needed for anxiety. 30 capsule 1   ibuprofen (ADVIL) 800 MG tablet TAKE 1 TABLET(800 MG) BY MOUTH EVERY 8 HOURS AS NEEDED FOR MODERATE PAIN (Patient not taking: Reported on 08/28/2023) 30 tablet 2   Iron Combinations (CHROMAGEN) capsule Take 1 capsule by mouth daily.     JANUVIA 25 MG tablet TAKE 1 TABLET BY MOUTH EVERY DAY 30 tablet 2   ketorolac (ACULAR) 0.5 % ophthalmic solution Place 1 drop into both eyes 4 (four) times daily as needed (eye pain). 5 mL 0   meloxicam (MOBIC) 7.5 MG tablet Take 1 tablet (7.5 mg total) by mouth daily. 30 tablet 1   methocarbamol (ROBAXIN) 500  MG tablet TAKE 1 TABLET(500 MG) BY MOUTH EVERY 8 HOURS AS NEEDED FOR MUSCLE SPASMS 30 tablet 1   methocarbamol (ROBAXIN) 500 MG tablet Take 1 tablet (500 mg total) by mouth every 8 (eight) hours as needed for muscle spasms. 30 tablet 1   metroNIDAZOLE (FLAGYL) 500 MG tablet Take 1 tablet (500 mg total) by mouth 2 (two) times daily. 14 tablet 0   omeprazole (PRILOSEC) 20 MG capsule Take 1 capsule (20 mg total) by mouth daily. (Patient not taking: Reported on 08/28/2023) 90 capsule 0   Prenatal Vit-Fe Fumarate-FA (PRENATAL MULTIVITAMIN) TABS tablet Take 1 tablet by mouth daily at 12 noon. (Patient not taking: Reported on 08/28/2023)     sucralfate (CARAFATE) 1 GM/10ML suspension Take 10 mLs (1 g total) by mouth 4 (four) times daily. (Patient not taking: Reported on 08/28/2023) 420 mL 1   [DISCONTINUED] clonazePAM (KLONOPIN) 0.5 MG tablet Take 1 tablet (0.5 mg total) by mouth 2 (two) times daily as needed for anxiety. 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     Tegretol [Carbamazepine] Other (See Comments)    Reaction:  Seizures     ROS:  Review of Systems   I have reviewed patient's Past Medical Hx, Surgical Hx, Family Hx, Social Hx, medications and allergies.   Physical Exam  Patient Vitals for the past 24 hrs:  BP Temp Pulse Resp  12/19/23 0010 115/68 98.5 F (36.9 C) 75 18   Constitutional: Well-developed, well-nourished female in no acute distress.  Cardiovascular: normal rate Respiratory: normal effort GI: Abd soft, non-tender. Pos BS x 4 MS: Extremities nontender, no edema, normal ROM Neurologic: Alert and oriented x 4.  GU: Neg CVAT.  PELVIC EXAM: Deferred    LAB RESULTS Results for orders placed or performed during the hospital encounter of 12/19/23 (from the  past 24 hours)  Type and screen [facility]     Status: None   Collection Time: 12/19/23 12:54 AM  Result Value Ref Range   ABO/RH(D) O POS    Antibody Screen NEG    Sample Expiration      12/22/2023,2359 Performed at [facility], [address] [place], [place] [address]   CBC     Status: Abnormal   Collection Time: 12/19/23 12:57 AM  Result Value Ref Range   WBC 11.1 (H) 4.0 - 10.5 K/uL   RBC 4.55 3.87 - 5.11 MIL/uL  Hemoglobin 11.9 (L) 12.0 - 15.0 g/dL   HCT 16.1 09.6 - 04.5 %   MCV 83.7 80.0 - 100.0 fL   MCH 26.2 26.0 - 34.0 pg   MCHC 31.2 30.0 - 36.0 g/dL   RDW 40.9 81.1 - 91.4 %   Platelets 252 150 - 400 K/uL   nRBC 0.0 0.0 - 0.2 %    --/--/O POS (12/28 0054)  IMAGING US OB Transvaginal Result Date: 12/19/2023 CLINICAL DATA:  First trimester vaginal bleeding. Recent ultrasound demonstrating a gestational sac with yolk sac but no visible fetal pole. EXAM: TRANSVAGINAL OB ULTRASOUND TECHNIQUE: Transvaginal ultrasound was performed for complete evaluation of the gestation as well as the maternal uterus, adnexal regions, and pelvic cul-de-sac. COMPARISON:  Study of 12/16/2023 FINDINGS: Intrauterine gestational sac: The gestational sac is no longer seen. Yolk sac:  The yolk sac is no longer seen. Embryo:  None. Cardiac Activity: N/a. MSD: None. CRL: None. Subchorionic hemorrhage:  None visualized. Maternal uterus/adnexae: Scattered traces of fluid or blood in the uterine cavity are noted. Surrounding this there is echogenic material measuring 10 mm in thickness with scattered color flow, most likely due to retained products of conception. The uterus is anteverted and measures 11.3 x 5.9 cm sagittal and AP. A transverse axis was not measured. The previous transverse measurement was 7.5 cm. In the dorsal lower uterine segment there is a transmural fibroid measuring 3.4 cm. There is no other visible wall mass. Both ovaries are sonographically unremarkable. No  free fluid or adnexal mass are seen. The cervix is not well seen but some images suggest it is at least 1 cm open and contains similar echogenic material as that in the uterus. IMPRESSION: 1. Gestational sac and yolk sac are no longer seen within the uterus consistent with a missed abortion. 2. 10 mm in thickness echogenic endometrial material with scattered color flow, findings suspicious for retained products of conception with scattered fluid or blood in the cavity. 3. The cervix not well seen but appears to be at least 1 cm open. 4. Lower uterine segment dorsal 3.4 cm fibroid. Electronically Signed   By: Almira Bar M.D.   On: 12/19/2023 02:27   US OB LESS THAN 14 WEEKS WITH OB TRANSVAGINAL Result Date: 12/16/2023 CLINICAL DATA:  Positive pregnancy test.  Vaginal bleeding. EXAM: OBSTETRIC <14 WK [place] AND TRANSVAGINAL OB US TECHNIQUE: Both transabdominal and transvaginal ultrasound examinations were performed for complete evaluation of the gestation as well as the maternal uterus, adnexal regions, and pelvic cul-de-sac. Transvaginal technique was performed to assess early pregnancy. COMPARISON:  None for this pregnancy. FINDINGS: Intrauterine gestational sac: Single. The gestational sac is slightly irregular. Yolk sac:  Present.  3.5 mm Embryo:  Not visualized Cardiac Activity: Not visualized MSD: 10.8 mm   5 w   6 d Subchorionic hemorrhage:  Small Maternal uterus/adnexae: Left corpus luteal cyst noted. Uterus and adnexa are otherwise within normal limits. No free fluid is present. IMPRESSION: 1. Slight irregularity of is a single gestational sac is still within normal limits. 2. Mean sac diameter of 10.8 mm correlates with an estimated fetal age of 5 weeks and 6 days. 3. Probable early intrauterine gestational sac, but no yolk sac, fetal pole, or cardiac activity yet visualized. Recommend follow-up quantitative B-HCG levels and follow-up US in 14 days to assess viability. This recommendation follows SRU  consensus guidelines: Diagnostic Criteria for Nonviable Pregnancy Early in the First Trimester. Malva Limes Med 2013; 782:9562-13. Electronically Signed   By:  Marin Roberts M.D.   On: 12/16/2023 10:06    MAU Management/MDM: Orders Placed This Encounter  Procedures   US OB Transvaginal   CBC   Type and screen [facility]   Discharge patient    Meds ordered this encounter  Medications   oxyCODONE-acetaminophen (PERCOCET/ROXICET) 5-325 MG per tablet 2 tablet    Refill:  0   traMADol (ULTRAM) 50 MG tablet    Sig: Take 1 tablet (50 mg total) by mouth every 6 (six) hours as needed.    Dispense:  6 tablet    Refill:  0    Supervising Provider:   Lazaro Arms [[id number]]    Previously seen gestational sac no longer visible, c/w miscarriage. Discussed results with pt.  Pain medication given in MAU, bleeding slowed while pt in MAU.  D/C home with bleeding precautions. Rx for Tramadol.  F/U at [facility] in 2 weeks.   ASSESSMENT 1. Miscarriage   2. [redacted] weeks gestation of pregnancy   3. 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     Tegretol [carbamazepine] Other (See Comments)   Reaction:  Seizures         Medication List     TAKE these medications    albuterol 108 (90 Base) MCG/ACT inhaler Commonly known as: VENTOLIN HFA Inhale 1-2 puffs into the lungs every 6 (six) hours as needed for wheezing or shortness of breath.   chromagen capsule Take 1 capsule by mouth daily.   EPINEPHrine 0.3 mg/0.3 mL Soaj injection Commonly known as: EPI-PEN Inject 0.3 mg into the muscle as needed for anaphylaxis.   hydrOXYzine 25 MG capsule Commonly known as: VISTARIL Take 1 capsule (25 mg total) by mouth every 8 (eight)  hours as needed for anxiety.   ibuprofen 800 MG tablet Commonly known as: ADVIL TAKE 1 TABLET(800 MG) BY MOUTH EVERY 8 HOURS AS NEEDED FOR MODERATE PAIN   Januvia 25 MG tablet Generic drug: sitaGLIPtin TAKE 1 TABLET BY MOUTH EVERY DAY   ketorolac 0.5 % ophthalmic solution Commonly known as: ACULAR Place 1 drop into both eyes 4 (four) times daily as needed (eye pain).   meloxicam 7.5 MG tablet Commonly known as: MOBIC Take 1 tablet (7.5 mg total) by mouth daily.   methocarbamol 500 MG tablet Commonly known as: ROBAXIN TAKE 1 TABLET(500 MG) BY MOUTH EVERY 8 HOURS AS NEEDED FOR MUSCLE SPASMS   methocarbamol 500 MG tablet Commonly known as: ROBAXIN Take 1 tablet (500 mg total) by mouth every 8 (eight) hours as needed for muscle spasms.   metroNIDAZOLE 500 MG tablet Commonly known as: FLAGYL Take 1 tablet (500 mg total) by mouth 2 (two) times daily.   omeprazole 20 MG capsule Commonly known as: PRILOSEC Take 1 capsule (20 mg total) by mouth daily.   prenatal multivitamin Tabs tablet Take 1 tablet by mouth daily at 12 noon.   sucralfate 1 GM/10ML suspension Commonly known as: Carafate Take 10 mLs (1 g total) by mouth 4 (four) times daily.   traMADol 50 MG tablet Commonly known as: ULTRAM Take 1 tablet (50 mg total) by mouth every 6 (six) hours as needed.  Follow-up Information     [facility] Follow up in 2 week(s).   Why: The clinic will call you with appointment. Contact information: [address] [place] [address] [phone]        [facility] Follow up.   Specialty: Obstetrics and Gynecology Why: As needed for emergencies Contact information: [address] [place] [address] [phone]                Sharen Counter Certified Nurse-Midwife 12/19/2023  3:22 AM

## 2023-12-19 NOTE — MAU Note (Signed)
EMS arrival. Stated she has been spotting since Monday. Came to [place] on Wed. And started having heavy bleeding and cramping tonight. Woke up ion  pool of blood and passed two golf ball sized clots in toilet State she thinks she passed the pregnancy but is still having a lot of pain. Small amount of bleeding note d on pad at this time.

## 2023-12-21 ENCOUNTER — Encounter: Payer: Self-pay | Admitting: Obstetrics and Gynecology

## 2023-12-29 ENCOUNTER — Ambulatory Visit (HOSPITAL_COMMUNITY)
Admission: RE | Admit: 2023-12-29 | Discharge: 2023-12-29 | Disposition: A | Discharge status: Home / Self Care | Payer: Managed Care, Other (non HMO) | Source: Ambulatory Visit | Attending: Obstetrics and Gynecology | Admitting: Obstetrics and Gynecology

## 2023-12-29 ENCOUNTER — Encounter (HOSPITAL_COMMUNITY): Payer: Self-pay

## 2024-01-01 ENCOUNTER — Encounter: Payer: Managed Care, Other (non HMO) | Admitting: Obstetrics & Gynecology

## 2024-01-04 ENCOUNTER — Encounter: Payer: Self-pay | Admitting: Advanced Practice Midwife

## 2024-01-04 ENCOUNTER — Ambulatory Visit (INDEPENDENT_AMBULATORY_CARE_PROVIDER_SITE_OTHER): Payer: Managed Care, Other (non HMO) | Admitting: Advanced Practice Midwife

## 2024-01-04 VITALS — BP 115/76 | HR 78 | Ht 65.0 in | Wt 193.0 lb

## 2024-01-04 DIAGNOSIS — O039 Complete or unspecified spontaneous abortion without complication: Secondary | ICD-10-CM | POA: Diagnosis not present

## 2024-01-04 DIAGNOSIS — F439 Reaction to severe stress, unspecified: Secondary | ICD-10-CM

## 2024-01-04 DIAGNOSIS — Z3009 Encounter for other general counseling and advice on contraception: Secondary | ICD-10-CM | POA: Diagnosis not present

## 2024-01-04 DIAGNOSIS — Z3A01 Less than 8 weeks gestation of pregnancy: Secondary | ICD-10-CM

## 2024-01-04 DIAGNOSIS — Z3169 Encounter for other general counseling and advice on procreation: Secondary | ICD-10-CM | POA: Diagnosis not present

## 2024-01-04 MED ORDER — PHEXXI 1.8-1-0.4 % VA GEL: 5.0000 g | VAGINAL | 11 refills | Status: DC | PRN

## 2024-01-04 MED ORDER — HYDROXYZINE PAMOATE 50 MG PO CAPS: 50.0000 mg | ORAL_CAPSULE | Freq: Four times a day (QID) | ORAL | 2 refills | Status: DC | PRN

## 2024-01-04 NOTE — Progress Notes (Signed)
 Pt presents for SAB f/u. Denies pain or bleeding.

## 2024-01-04 NOTE — Progress Notes (Signed)
   GYNECOLOGY PROGRESS NOTE  History:  43 y.o. [id number] presents to [facility] office today for miscarriage follow up visit.  She was initially seen 12/16/23 in MAU, and had ultrasound with IUP, irregular gestational sac, no yolk sac, or fetal pole.  On 12/19/23, she returned to MAU with additional bleeding, and the previously seen gestational sac was not seen, confirming the diagnosis of miscarriage.   There is no bleeding or pain currently. Although this pregnancy was unplanned, she and her husband would not be upset about another pregnancy.  She wants to prevent pregnancy, however, until she has some testing, and follows up with us  about trying again.  She denies h/a, dizziness, shortness of breath, n/v, or fever/chills.    The following portions of the patient's history were reviewed and updated as appropriate: allergies, current medications, past family history, past medical history, past social history, past surgical history and problem list. Last pap smear on 01/12/23 was abnormal with ASCUS and negative HPV, repeat Pap in 3 years per ASCCP guidelines.   Health Maintenance Due  Topic Date Due   COVID-19 Vaccine (1) Never done   Pneumococcal Vaccine 3-47 Years old (1 of 2 - PCV) Never done   FOOT EXAM  Never done   OPHTHALMOLOGY EXAM  Never done   Diabetic kidney evaluation - Urine ACR  Never done   INFLUENZA VACCINE  Never done   HEMOGLOBIN A1C  10/06/2023     Review of Systems:  Pertinent items are noted in HPI.   Objective:  Physical Exam Blood pressure 115/76, pulse 78, height 5' 5 (1.651 m), weight 193 lb (87.5 kg), last menstrual period 10/08/2023. VS reviewed, nursing note reviewed,  Constitutional: well developed, well nourished, no distress HEENT: normocephalic CV: normal rate Pulm/chest wall: normal effort Breast Exam: deferred Abdomen: soft Neuro: alert and oriented x 3 Skin: warm, dry Psych: affect normal Pelvic exam: Deferred  Assessment & Plan:  1.  Miscarriage (Primary)  - Beta hCG quant (ref lab)  2. Pre-conception counseling --Discussed risks of pregnancy over age 13, with increased risks of genetic birth defects, and miscarriage, but the majority of pregnancies are normal.  --Pt is not preventing but not actively trying to become pregnant --Offered genetic counseling, labwork in the office, etc. For follow up --Pt to return in 1 month for labs, will consider genetic counseling  3. Encounter for counseling regarding contraception --Pt has tried several methods and has side effects. Would like something nonhormonal.  -Rx for Phexxi  but pt may use abstinence until her follow up visit in the office  1 month   4. Situational stress --Pt taking Vistaril  25 mg, it is helping but not enough for her recent stress with the miscarriage.  --Increase dose to 50 mg Q 6 hours PRN   Return in about 1 month (around 02/04/2024) for Gyn follow up with me.   Olam Boards, CNM 5:29 PM

## 2024-01-05 LAB — BETA HCG QUANT (REF LAB): hCG Quant: 11 m[IU]/mL

## 2024-01-07 ENCOUNTER — Encounter: Payer: Managed Care, Other (non HMO) | Admitting: Obstetrics and Gynecology

## 2024-04-02 ENCOUNTER — Other Ambulatory Visit: Payer: Self-pay | Admitting: Family

## 2024-04-02 DIAGNOSIS — N946 Dysmenorrhea, unspecified: Secondary | ICD-10-CM

## 2024-04-04 NOTE — Telephone Encounter (Signed)
 Patient currently pregnant. Patient to follow-up with Obstetrics Gynecology for advisement.

## 2024-04-06 NOTE — Telephone Encounter (Signed)
 Patient currently pregnant. Patient to follow-up with Obstetrics Gynecology for advisement.

## 2024-04-13 NOTE — Telephone Encounter (Signed)
 Patient currently pregnant. Patient to follow-up with Obstetrics Gynecology for advisement.

## 2024-05-01 ENCOUNTER — Other Ambulatory Visit: Payer: Self-pay | Admitting: Family

## 2024-05-01 DIAGNOSIS — M791 Myalgia, unspecified site: Secondary | ICD-10-CM

## 2024-06-28 ENCOUNTER — Encounter: Payer: Self-pay | Admitting: Family

## 2024-06-28 ENCOUNTER — Ambulatory Visit (INDEPENDENT_AMBULATORY_CARE_PROVIDER_SITE_OTHER): Admitting: Family

## 2024-06-28 ENCOUNTER — Ambulatory Visit: Payer: Self-pay | Admitting: Family

## 2024-06-28 VITALS — BP 109/76 | HR 71 | Temp 97.9°F | Resp 16 | Ht 65.0 in | Wt 188.2 lb

## 2024-06-28 DIAGNOSIS — R093 Abnormal sputum: Secondary | ICD-10-CM

## 2024-06-28 DIAGNOSIS — N946 Dysmenorrhea, unspecified: Secondary | ICD-10-CM

## 2024-06-28 DIAGNOSIS — Z87892 Personal history of anaphylaxis: Secondary | ICD-10-CM

## 2024-06-28 DIAGNOSIS — F40243 Fear of flying: Secondary | ICD-10-CM

## 2024-06-28 DIAGNOSIS — Z8759 Personal history of other complications of pregnancy, childbirth and the puerperium: Secondary | ICD-10-CM

## 2024-06-28 DIAGNOSIS — F418 Other specified anxiety disorders: Secondary | ICD-10-CM

## 2024-06-28 DIAGNOSIS — E119 Type 2 diabetes mellitus without complications: Secondary | ICD-10-CM | POA: Diagnosis not present

## 2024-06-28 DIAGNOSIS — Z7984 Long term (current) use of oral hypoglycemic drugs: Secondary | ICD-10-CM

## 2024-06-28 DIAGNOSIS — Z8632 Personal history of gestational diabetes: Secondary | ICD-10-CM

## 2024-06-28 DIAGNOSIS — F32A Depression, unspecified: Secondary | ICD-10-CM

## 2024-06-28 DIAGNOSIS — R0989 Other specified symptoms and signs involving the circulatory and respiratory systems: Secondary | ICD-10-CM

## 2024-06-28 LAB — POCT GLYCOSYLATED HEMOGLOBIN (HGB A1C): Hemoglobin A1C: 6.4 % — AB (ref 4.0–5.6)

## 2024-06-28 MED ORDER — HYDROXYZINE PAMOATE 50 MG PO CAPS: 50.0000 mg | ORAL_CAPSULE | Freq: Four times a day (QID) | ORAL | 2 refills | Status: DC | PRN

## 2024-06-28 MED ORDER — EPINEPHRINE 0.3 MG/0.3ML IJ SOAJ
0.3000 mg | INTRAMUSCULAR | 2 refills | Status: AC | PRN
Start: 2024-06-28 — End: ?

## 2024-06-28 MED ORDER — METHOCARBAMOL 500 MG PO TABS
500.0000 mg | ORAL_TABLET | Freq: Three times a day (TID) | ORAL | 0 refills | Status: AC | PRN
Start: 2024-06-28 — End: ?

## 2024-06-28 MED ORDER — DIAZEPAM 5 MG PO TABS: ORAL_TABLET | ORAL | 0 refills | Status: AC

## 2024-06-28 MED ORDER — MELOXICAM 7.5 MG PO TABS: 7.5000 mg | ORAL_TABLET | Freq: Every day | ORAL | 0 refills | Status: DC

## 2024-06-28 NOTE — Progress Notes (Signed)
 Miscarriage on 12/18/2023 at 10 weeks, has a trip coming up and wants something for the flight, medication refill, coughs and can not get anything up been going on for a few months

## 2024-06-28 NOTE — Progress Notes (Signed)
 Patient ID: Ashley Casey, female    DOB: 08-30-1981  MRN: [id number]  CC: Miscarriage  Subjective: Ashley Casey is a 43 y.o. female who presents for miscarriage.   Her concerns today include:  - Patient was last seen on 01/04/2024 at [facility] for [facility] [place] following miscarriage. It was noted patient was to follow-up with the same. Today patient states she did not follow-up as of present. She denies red flag symptoms.  - History of gestational diabetes.  - Due for diabetic eye exam.  - Due for diabetic foot exam. - Doing well on Hydroxyzine , no issues/concerns. States she plans to schedule an appointment with the therapist at her job. She denies thoughts of self-harm, suicidal ideations, homicidal ideations. - Needs refills on Epi Pen. No recent anaphylaxis or allergic reactions. - States intermittent phlegm in throat persisting for months. Denies red flag symptoms.  - Doing well on Meloxicam  and Methocarbamol  for menses related pain. - States she is scheduled to take a flight on 07/06/2024 and plans to return on 07/10/2024. States Valium  helps for flight anxiety.    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 8 g 0   ibuprofen  (ADVIL ) 800 MG tablet TAKE 1 TABLET(800 MG) BY MOUTH EVERY 8 HOURS AS NEEDED FOR MODERATE PAIN 30 tablet 2   Iron  Combinations (CHROMAGEN) capsule Take 1 capsule by mouth daily.      JANUVIA  25 MG tablet Take 25 mg by mouth daily.     Lactic Ac-Citric Ac-Pot Bitart (PHEXXI ) 1.8-1-0.4 % GEL Place 5 g vaginally as needed. (Patient not taking: Reported on 06/28/2024) 60 g 11   [DISCONTINUED] clonazePAM  (KLONOPIN ) 0.5 MG tablet Take 1 tablet (0.5 mg total) by mouth 2 (two) times daily as needed for anxiety. 6 tablet 0   No current facility-administered medications on file prior to visit.    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     Tegretol [Carbamazepine] Other (See Comments)    Reaction:  Seizures     Social History   Socioeconomic History   Marital status: Married    Spouse name: Not on file   Number of children: 7   Years of education: Not on file   Highest education level: Associate degree: occupational, Scientist, product/process development, or vocational program  Occupational History   Occupation: shuttle driver  Tobacco Use   Smoking status: Never    Passive exposure: Never   Smokeless tobacco: Never  Vaping Use   Vaping status: Never Used  Substance and Sexual Activity   Alcohol use: No   Drug use: No   Sexual activity: Yes    Partners: Male    Birth control/protection: None  Other  Topics Concern   Not on file  Social History Narrative   Not on file   Social Drivers of Health   Financial Resource Strain: Low Risk  (06/27/2024)   Overall Financial Resource Strain (CARDIA)    Difficulty of Paying Living Expenses: Not hard at all  Food Insecurity: No Food Insecurity (06/27/2024)   Hunger Vital Sign    Worried About Running Out of Food in the Last Year: Never true    Ran Out of Food in the Last Year: Never true  Transportation Needs: No Transportation Needs (06/27/2024)   PRAPARE - Administrator, Civil Service (Medical): No    Lack of Transportation  (Non-Medical): No  Physical Activity: Sufficiently Active (06/27/2024)   Exercise Vital Sign    Days of Exercise per Week: 4 days    Minutes of Exercise per Session: 60 min  Stress: Stress Concern Present (06/28/2024)   [employer] of Occupational Health - Occupational Stress Questionnaire    Feeling of Stress: To some extent  Social Connections: Moderately Isolated (06/27/2024)   Social Connection and Isolation Panel    Frequency of Communication with Friends and Family: Three times a week    Frequency of Social Gatherings with Friends and Family: Three times a week    Attends Religious Services: Never    Active Member of Clubs or Organizations: No    Attends Banker Meetings: Not on file    Marital Status: Married  Catering manager Violence: Not At Risk (06/28/2024)   Humiliation, Afraid, Rape, and Kick questionnaire    Fear of Current or Ex-Partner: No    Emotionally Abused: No    Physically Abused: No    Sexually Abused: No    Family History  Problem Relation Age of Onset   Other Mother        sickle cell anemia   Diabetes Mother    Sickle cell anemia Mother    Anemia Sister    Arthritis Brother    Asthma Brother    Diabetes Maternal Grandmother    Diabetes Paternal Grandmother    Colon cancer Neg Hx    Stomach cancer Neg Hx    Esophageal cancer Neg Hx     Past Surgical History:  Procedure Laterality Date   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     CHOLECYSTECTOMY  8/29/121   THERAPEUTIC ABORTION     WISDOM TOOTH EXTRACTION      ROS: Review of Systems Negative except as stated above  PHYSICAL EXAM: BP 109/76   Pulse 71   Temp 97.9 F (36.6 C) (Oral)   Resp 16   Ht 5' 5 (1.651 m)   Wt 188 lb 3.2 oz (85.4 kg)   LMP 06/03/2024 (Exact Date)   SpO2 96%   Breastfeeding No   BMI 31.32 kg/m   Physical Exam HENT:     Head: Normocephalic and atraumatic.     Nose: Nose normal.     Mouth/Throat:     Mouth: Mucous membranes are moist.      Pharynx: Oropharynx is clear.  Eyes:     Extraocular Movements: Extraocular movements intact.     Conjunctiva/sclera: Conjunctivae normal.     Pupils: Pupils are equal, round, and reactive to light.  Cardiovascular:     Rate and Rhythm: Normal rate and regular rhythm.     Pulses: Normal pulses.     Heart sounds: Normal heart sounds.  Pulmonary:     Effort: Pulmonary effort is normal.  Breath sounds: Normal breath sounds.  Musculoskeletal:        General: Normal range of motion.     Cervical back: Normal range of motion and neck supple.  Neurological:     General: No focal deficit present.     Mental Status: She is alert and oriented to person, place, and time.  Psychiatric:        Mood and Affect: Mood normal.        Behavior: Behavior normal.     ASSESSMENT AND PLAN: 1. History of miscarriage (Primary) - Referral to Gynecology for evaluation/management. - Ambulatory referral to Gynecology  2. History of gestational diabetes mellitus - Routine screening.  - HgB A1c - Microalbumin / creatinine urine ratio - Hemoglobin A1c  3. Diabetic eye exam [facility]) - Referral to Ophthalmology for evaluation/management. - Ambulatory referral to Ophthalmology  4. Encounter for diabetic foot exam [facility]) - Referral to Podiatry for evaluation/management. - Ambulatory referral to Podiatry  5. Anxiety and depression - Patient denies thoughts of self-harm, suicidal ideations, homicidal ideations. - Continue Hydroxyzine  as prescribed. Counseled on medication adherence/adverse effects.  - Keep all scheduled appointments with therapist.  - Follow-up with primary provider as scheduled. - hydrOXYzine  (VISTARIL ) 50 MG capsule; Take 1 capsule (50 mg total) by mouth every 6 (six) hours as needed.  Dispense: 90 capsule; Refill: 2  6. History of anaphylaxis - Epinephrine  injection as prescribed. Counseled on medication adherence/adverse effects.  - Follow-up with primary provider as scheduled. -  EPINEPHrine  0.3 mg/0.3 mL IJ SOAJ injection; Inject 0.3 mg into the muscle as needed for anaphylaxis.  Dispense: 2 each; Refill: 2  7. Phlegm in throat - Routine screening.  - Referral to Pulmonology for evaluation/management. - Ambulatory referral to Pulmonology - COVID-19, Flu A+B and RSV - POCT rapid strep A; Future - Culture, Group A Strep  8. Dysmenorrhea - Continue Methocarbamol  and Meloxicam  as prescribed. Counseled on medication adherence/adverse effects.  - Follow-up with primary provider as scheduled.  - methocarbamol  (ROBAXIN ) 500 MG tablet; Take 1 tablet (500 mg total) by mouth every 8 (eight) hours as needed for muscle spasms.  Dispense: 90 tablet; Refill: 0 - meloxicam  (MOBIC ) 7.5 MG tablet; Take 1 tablet (7.5 mg total) by mouth daily.  Dispense: 90 tablet; Refill: 0  9. Fear of flying - Diazepam  as prescribed. Counseled on medication adherence/adverse effects. Prescription future dated for 07/04/2024. - diazepam  (VALIUM ) 5 MG tablet; Take 1 tablet (5 mg total) by mouth every 12 (twelve) hours as needed for fear of flying.  Dispense: 2 tablet; Refill: 0   Patient was given the opportunity to ask questions.  Patient verbalized understanding of the plan and was able to repeat key elements of the plan. Patient was given clear instructions to go to Emergency Department or return to medical center if symptoms don't improve, worsen, or new problems develop.The patient verbalized understanding.   Orders Placed This Encounter  Procedures   COVID-19, Flu A+B and RSV   Culture, Group A Strep   Microalbumin / creatinine urine ratio   Hemoglobin A1c   Ambulatory referral to Gynecology   Ambulatory referral to Pulmonology   Ambulatory referral to Ophthalmology   Ambulatory referral to Podiatry   HgB A1c   POCT rapid strep A     Requested Prescriptions   Signed Prescriptions Disp Refills   hydrOXYzine  (VISTARIL ) 50 MG capsule 90 capsule 2    Sig: Take 1 capsule (50 mg total)  by mouth every 6 (six) hours as  needed.   EPINEPHrine  0.3 mg/0.3 mL IJ SOAJ injection 2 each 2    Sig: Inject 0.3 mg into the muscle as needed for anaphylaxis.   methocarbamol  (ROBAXIN ) 500 MG tablet 90 tablet 0    Sig: Take 1 tablet (500 mg total) by mouth every 8 (eight) hours as needed for muscle spasms.   meloxicam  (MOBIC ) 7.5 MG tablet 90 tablet 0    Sig: Take 1 tablet (7.5 mg total) by mouth daily.   diazepam  (VALIUM ) 5 MG tablet 2 tablet 0    Sig: Take 1 tablet (5 mg total) by mouth every 12 (twelve) hours as needed for fear of flying.    Follow-up with primary provider as scheduled.  Greig JINNY Drones, NP

## 2024-06-29 LAB — COVID-19, FLU A+B AND RSV
Influenza A, NAA: NOT DETECTED
Influenza B, NAA: NOT DETECTED
RSV, NAA: NOT DETECTED
SARS-CoV-2, NAA: NOT DETECTED

## 2024-06-29 LAB — HEMOGLOBIN A1C
Est. average glucose Bld gHb Est-mCnc: 131 mg/dL
Hgb A1c MFr Bld: 6.2 % — ABNORMAL HIGH (ref 4.8–5.6)

## 2024-06-29 LAB — MICROALBUMIN / CREATININE URINE RATIO
Creatinine, Urine: 47.7 mg/dL
Microalb/Creat Ratio: <6 mg/g{creat} (ref 0–29)
Microalbumin, Urine: <3 ug/mL

## 2024-07-01 LAB — CULTURE, GROUP A STREP: Strep A Culture: NEGATIVE

## 2024-07-04 NOTE — Telephone Encounter (Signed)
 Copied from CRM [id number]. Topic: Clinical - Lab/Test Results >> Jul 04, 2024 10:25 AM Nathanel BROCKS wrote: Reason for CRM: pt called for lab results and to return her call.

## 2024-07-11 ENCOUNTER — Ambulatory Visit (INDEPENDENT_AMBULATORY_CARE_PROVIDER_SITE_OTHER): Admitting: Podiatry

## 2024-07-11 DIAGNOSIS — E119 Type 2 diabetes mellitus without complications: Secondary | ICD-10-CM | POA: Diagnosis not present

## 2024-07-11 DIAGNOSIS — R7303 Prediabetes: Secondary | ICD-10-CM

## 2024-07-11 NOTE — Progress Notes (Signed)
     Chief Complaint  Patient presents with   [facility]    Last A1c: 6.4. No anticoag. Patient is unsure of reason for visit. She gets pedicures, no issues at all with her feet aside from occasional swelling. No sensation issues, no pins and needles. Her PCP sent her here. Patient reports being pre-diabetic.    HPI: 43 y.o. female presents today for diabetic foot exam.  Patient states that she was told that she is not diabetic, only prediabetic.  Denies any numbness in the feet.  Denies any issues with swelling in her legs and ankles.  She trims her own toenails and states that she does not have difficulty doing so.  Denies any weakness in the legs or feet.  Past Medical History:  Diagnosis Date   Abnormal Pap smear    Anxiety    in past   Bell's palsy    after 5th pregnancy never resolved   Cholelithiasis    Fatigue    History of shingles    IBS (irritable bowel syndrome)    Neuromuscular disorder [facility])    Past Surgical History:  Procedure Laterality Date   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     CHOLECYSTECTOMY  8/29/121   THERAPEUTIC ABORTION     WISDOM TOOTH EXTRACTION     Allergies  Allergen Reactions   Food Anaphylaxis and Other (See Comments)    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     Tegretol [Carbamazepine] Other (See Comments)    Reaction:  Seizures     Physical Exam: General: The patient is alert and oriented x3 in no acute distress.  Dermatology: Skin is warm, dry and supple bilateral lower extremities. Interspaces are clear of maceration and debris.  Nails are neatly trimmed without evidence of onychomycosis.  Vascular: Palpable pedal pulses bilaterally. Capillary refill within normal limits.  No appreciable edema.  No erythema or calor.  Neurological: Light touch, protective and vibratory sensation grossly intact bilateral feet.    Musculoskeletal Exam: No pedal deformities noted  Assessment/Plan of Care: 1. Prediabetes   2. Encounter for diabetic foot exam [facility])    Discussed clinical findings with patient today.  Informed her that she has excellent circulation and sensation in the feet.  Continue with staying active.  She may trim her nails since they appear well cared for.  Would recommend annual follow-ups for foot exams.  Awanda CHARM Imperial, DPM, FACFAS [facility]     [address] [place], [place] [address]                Office [phone]  Fax [phone]

## 2024-08-16 ENCOUNTER — Encounter: Payer: Self-pay | Admitting: Pulmonary Disease

## 2024-08-25 ENCOUNTER — Ambulatory Visit
Admission: EM | Admit: 2024-08-25 | Discharge: 2024-08-25 | Disposition: A | Discharge status: Home / Self Care | Attending: Physician Assistant | Admitting: Physician Assistant

## 2024-08-25 ENCOUNTER — Encounter: Payer: Self-pay | Admitting: Emergency Medicine

## 2024-08-25 DIAGNOSIS — J069 Acute upper respiratory infection, unspecified: Secondary | ICD-10-CM | POA: Insufficient documentation

## 2024-08-25 LAB — SARS CORONAVIRUS 2 BY RT PCR: SARS Coronavirus 2 by RT PCR: NEGATIVE

## 2024-08-25 MED ORDER — ACETAMINOPHEN 325 MG PO TABS
650.0000 mg | ORAL_TABLET | Freq: Once | ORAL | Status: AC
  Administered 2024-08-25: 650 mg via ORAL

## 2024-08-25 NOTE — ED Provider Notes (Signed)
 [name]    CSN: [id number] Arrival date & time: 08/25/24  1709      History   Chief Complaint Chief Complaint  Patient presents with   Nasal Congestion   Cough   Headache    HPI Ashley Casey is a 42 y.o. female.   Here today for evaluation of nasal congestion postnasal drip, headache, cough that started this afternoon.  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  She states she did take NyQuil earlier with mild relief.  She denies any known fever or chills.  The history is provided by the patient.  Cough Associated symptoms: headaches   Associated symptoms: no chills, no ear pain, no eye discharge, no fever, no shortness of breath, no sore throat and no wheezing   Headache Associated symptoms: congestion, cough, diarrhea and drainage   Associated symptoms: no abdominal pain, no ear pain, no fever, no nausea, no sore throat and no vomiting     Past Medical History:  Diagnosis Date   Abnormal Pap smear    Anxiety    in past   Bell's palsy    after 5th pregnancy never resolved   Cholelithiasis    Fatigue    History of shingles    IBS (irritable bowel syndrome)    Neuromuscular disorder [facility])     Patient Active Problem List   Diagnosis Date Noted   Exposure to sexually transmitted disease (STD) 01/12/2023   Abnormal uterine bleeding (AUB) 01/12/2023   Diabetes mellitus, type 2 (HCC) 11/18/2022   Iron  deficiency anemia 11/18/2022   Spasm 10/16/2022   Vulvovaginitis 10/16/2022   Vulvar lesion 09/05/2022   Fibroadenoma of breast 09/05/2022   History of gestational diabetes mellitus (GDM) 01/15/2018    Past Surgical History:  Procedure Laterality Date   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     CHOLECYSTECTOMY  8/29/121   THERAPEUTIC ABORTION     WISDOM TOOTH EXTRACTION      OB History     Gravida  13   Para  7   Term  7   Preterm      AB  4   Living  7      SAB  3   IAB  1    Ectopic      Multiple  0   Live Births  7            Home Medications    Prior to Admission medications   Medication Sig Start Date End Date Taking? Authorizing Provider  hydrOXYzine  (VISTARIL ) 50 MG capsule Take 1 capsule (50 mg total) by mouth every 6 (six) hours as needed. 06/28/24  Yes Lorren, Amy J, NP  Iron  Combinations (CHROMAGEN) capsule Take 1 capsule by mouth daily.   Yes [provider]  JANUVIA  25 MG tablet Take 25 mg by mouth daily. 06/23/24  Yes [provider]  albuterol  (VENTOLIN  HFA) 108 (90 Base) MCG/ACT inhaler Inhale 1-2 puffs into the lungs every 6 (six) hours as needed for wheezing or shortness of breath. 04/06/23   Lorren Greig PARAS, NP  diazepam  (VALIUM ) 5 MG tablet Take 1 tablet (5 mg total) by mouth every 12 (twelve) hours as needed for fear of flying. Patient not taking: Reported on 08/25/2024 07/04/24   Lorren Greig PARAS, NP  EPINEPHrine  0.3 mg/0.3 mL IJ SOAJ injection Inject 0.3 mg into the muscle as needed for anaphylaxis. 06/28/24   Lorren Greig PARAS, NP  meloxicam  (MOBIC ) 7.5 MG tablet Take 1 tablet (7.5 mg total) by mouth daily. Patient not taking: Reported on 08/25/2024 06/28/24   Lorren Greig PARAS, NP  methocarbamol  (ROBAXIN ) 500 MG tablet Take 1 tablet (500 mg total) by mouth every 8 (eight) hours as needed for muscle spasms. Patient not taking: Reported on 08/25/2024 06/28/24   Lorren Greig PARAS, NP  clonazePAM  (KLONOPIN ) 0.5 MG tablet Take 1 tablet (0.5 mg total) by mouth 2 (two) times daily as needed for anxiety. 05/16/19 02/15/20  Babara Greig GAILS, PA-C    Family History Family History  Problem Relation Age of Onset   Other Mother        sickle cell anemia   Diabetes Mother    Sickle cell anemia Mother    Anemia Sister    Arthritis Brother    Asthma Brother    Diabetes Maternal Grandmother    Diabetes Paternal Grandmother    Colon cancer Neg Hx    Stomach cancer Neg Hx    Esophageal cancer Neg Hx     Social History Social History   Tobacco Use    Smoking status: Never    Passive exposure: Never   Smokeless tobacco: Never  Vaping Use   Vaping status: Never Used  Substance Use Topics   Alcohol use: No   Drug use: No     Allergies   Food, Other, and Tegretol [carbamazepine]   Review of Systems Review of Systems  Constitutional:  Negative for chills and fever.  HENT:  Positive for congestion and postnasal drip. Negative for ear pain and sore throat.   Eyes:  Negative for discharge and redness.  Respiratory:  Positive for cough. Negative for shortness of breath and wheezing.   Gastrointestinal:  Positive for diarrhea. Negative for abdominal pain, nausea and vomiting.  Neurological:  Positive for headaches.     Physical Exam Triage Vital Signs ED Triage Vitals  Encounter Vitals Group     BP 08/25/24 1845 (!) 130/90     Girls Systolic BP Percentile --      Girls Diastolic BP Percentile --      Boys Systolic BP Percentile --      Boys Diastolic BP Percentile --      Pulse Rate 08/25/24 1845 77     Resp 08/25/24 1845 18     Temp 08/25/24 1845 97.7 F (36.5 C)     Temp Source 08/25/24 1845 Oral     SpO2 08/25/24 1845 99 %     Weight --      Height --      Head Circumference --      Peak Flow --      Pain Score 08/25/24 1846 7     Pain Loc --      Pain Education --      Exclude from Growth Chart --    No data found.  Updated Vital Signs BP (!) 130/90 (BP Location: Left Arm)   Pulse 77   Temp 97.7 F (36.5 C) (Oral)   Resp 18   LMP 08/04/2024 (Exact Date)   SpO2 99%   Visual Acuity Right Eye Distance:   Left Eye Distance:   Bilateral Distance:    Right Eye Near:   Left Eye Near:    Bilateral Near:     Physical Exam Vitals and nursing note reviewed.  Constitutional:      General: She is not in acute distress.    Appearance: Normal appearance. She is not  ill-appearing.  HENT:     Head: Normocephalic and atraumatic.     Nose: Congestion present.     Mouth/Throat:     Mouth: Mucous membranes  are moist.     Pharynx: No oropharyngeal exudate or posterior oropharyngeal erythema.  Eyes:     Conjunctiva/sclera: Conjunctivae normal.  Cardiovascular:     Rate and Rhythm: Normal rate and regular rhythm.     Heart sounds: Normal heart sounds. No murmur heard. Pulmonary:     Effort: Pulmonary effort is normal. No respiratory distress.     Breath sounds: Normal breath sounds. No wheezing, rhonchi or rales.  Skin:    General: Skin is warm and dry.  Neurological:     Mental Status: She is alert.  Psychiatric:        Mood and Affect: Mood normal.        Thought Content: Thought content normal.      UC Treatments / Results  Labs (all labs ordered are listed, but only abnormal results are displayed) Labs Reviewed  SARS CORONAVIRUS 2 BY RT PCR    EKG   Radiology No results found.  Procedures Procedures (including critical care time)  Medications Ordered in UC Medications  acetaminophen  (TYLENOL ) tablet 650 mg (650 mg Oral Given 08/25/24 1852)    Initial Impression / Assessment and Plan / UC Course  I have reviewed the triage vital signs and the nursing notes.  Pertinent labs & imaging results that were available during my care of the patient were reviewed by me and considered in my medical decision making (see chart for details).    Suspect viral etiology of symptoms.  Will screen for COVID and advised symptomatic treatment, increase fluids and rest.  Recommended follow-up if no gradual improvement with any worsening symptoms.  Final Clinical Impressions(s) / UC Diagnoses   Final diagnoses:  Acute upper respiratory infection   Discharge Instructions   None    ED Prescriptions   None    PDMP not reviewed this encounter.   Billy Asberry FALCON, PA-C 08/25/24 [phone]

## 2024-08-25 NOTE — ED Triage Notes (Signed)
 Pt reports waking up at 3pm (works 3rd shift) with nasal congestion, postnasal drainage, headache, and productive cough. 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 Has only taken nyquil earlier for symptoms. Denies fever and chills.

## 2024-08-30 ENCOUNTER — Other Ambulatory Visit: Payer: Self-pay | Admitting: Family

## 2024-08-30 NOTE — Telephone Encounter (Signed)
 Pt scheduled

## 2024-08-30 NOTE — Telephone Encounter (Signed)
 Januvia  appears as historical medication. Schedule appointment.

## 2024-09-28 ENCOUNTER — Encounter: Payer: Self-pay | Admitting: Pulmonary Disease

## 2024-09-28 ENCOUNTER — Ambulatory Visit: Admitting: Pulmonary Disease

## 2024-09-28 VITALS — BP 110/82 | HR 69 | Temp 98.2°F | Ht 65.0 in | Wt 190.0 lb

## 2024-09-28 DIAGNOSIS — R0989 Other specified symptoms and signs involving the circulatory and respiratory systems: Secondary | ICD-10-CM

## 2024-09-28 NOTE — Progress Notes (Signed)
 Ashley Casey    [id number]    [birth year]  Primary Care Physician:Stephens, Greig PARAS, NP  Referring Physician: Lorren Greig PARAS, NP [address] [place],  [place] [address]  Chief complaint: Consult for chest congestion  HPI: 43 y.o. who  has a past medical history of Abnormal Pap smear, Anxiety, Bell's palsy, Cholelithiasis, Fatigue, History of shingles, IBS (irritable bowel syndrome), and Neuromuscular disorder (HCC).  Discussed the use of AI scribe software for clinical note transcription with the patient, who gave verbal consent to proceed.  History of Present Illness Ashley Casey is a 43 year old female who presents with phlegm in the chest. She was referred by Dr. Greig Gravely for evaluation of phlegm in the chest.  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 - Requires EpiPen , albuterol  inhaler, and Benadryl  for allergic reactions - Albuterol  inhaler used only during allergic reactions to assist breathing  Relevant Pulmonary history: Pets: 3 cats, turtle who died recently Occupation: Travel abroad travel 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   EPINEPHrine  0.3 mg/0.3 mL IJ SOAJ injection Inject 0.3 mg into the muscle as needed for anaphylaxis.   hydrOXYzine  (VISTARIL ) 50 MG capsule Take 1 capsule (50 mg total) by mouth every 6 (six) hours as needed.   Iron  Combinations (CHROMAGEN) capsule Take 1 capsule by mouth daily.   JANUVIA  25 MG tablet Take 25 mg by mouth daily.   meloxicam  (MOBIC ) 7.5 MG tablet Take 1 tablet (7.5 mg total) by mouth daily.   methocarbamol  (ROBAXIN ) 500 MG tablet Take 1 tablet (500 mg total) by mouth every 8 (eight) hours as needed for muscle spasms.   diazepam  (VALIUM ) 5 MG tablet Take 1 tablet (5 mg total) by mouth every 12 (twelve) hours as needed for fear of flying. (Patient not taking: Reported on 09/28/2024)   [DISCONTINUED] clonazePAM  (KLONOPIN ) 0.5 MG tablet Take 1 tablet (0.5 mg total) by mouth 2 (two) times daily as needed for anxiety.   No facility-administered encounter medications on file as of 09/28/2024.     Physical Exam: Today's Vitals   09/28/24 0921  BP: 110/82  Pulse: 69  Temp: 98.2 F (36.8 C)  TempSrc: Oral  SpO2: 98%  Weight: 190 lb (86.2 kg)  Height: 5' 5 (1.651 m)   Body mass index is 31.62 kg/m.  Physical Exam GEN: No acute distress. CV: Regular rate and rhythm, no murmurs. LUNGS: Clear to auscultation bilaterally, normal respiratory effort. SKIN JOINTS: Warm and dry, no rash.    Data Reviewed: Imaging: CTA 07/29/2022-no pulmonary embolism, mosaic attenuation of the lungs CT abdomen 05/27/2023-mild dependent atelectasis. I have reviewed the images  personally.   PFTs:  Labs:  Assessment and Plan Assessment & Plan Chest congestion Previous episode in July 2025 likely due to viral infection, with congestion, phlegm, and headache. Symptoms resolved within a few days. No history of asthma or smoking. Lungs clear on examination.  Patient does not feel she needs a pulmonary evaluation - Advise to  return for evaluation if respiratory symptoms recur or persist.  Recommendations: Return to clinic as needed  Darrel Baroni MD [facility] 09/28/2024, 9:33 AM  CC: Lorren Greig PARAS, NP

## 2024-09-28 NOTE — Patient Instructions (Signed)
  VISIT SUMMARY: You visited us  today for an evaluation of phlegm in your chest, which you experienced in July. Your symptoms have since resolved, and you have no ongoing respiratory issues. We also reviewed your severe allergic reactions to citrus fruits.  YOUR PLAN: ANAPHYLACTIC REACTION TO CITRUS FRUITS: You have severe allergic reactions to oranges and grapefruit, requiring emergency medications. -Continue carrying your EpiPen  and albuterol  inhaler for emergency use in case of exposure to citrus fruits.  RESPIRATORY SYMPTOMS: You had congestion, phlegm, and headache in July, likely due to a viral infection. These symptoms have resolved. -Return for evaluation if respiratory symptoms recur or persist.

## 2024-10-17 ENCOUNTER — Ambulatory Visit (INDEPENDENT_AMBULATORY_CARE_PROVIDER_SITE_OTHER): Admitting: Family

## 2024-10-17 ENCOUNTER — Encounter: Payer: Self-pay | Admitting: Family

## 2024-10-17 VITALS — BP 109/76 | HR 81 | Temp 98.0°F | Resp 16 | Ht 65.0 in | Wt 189.4 lb

## 2024-10-17 DIAGNOSIS — K5900 Constipation, unspecified: Secondary | ICD-10-CM

## 2024-10-17 DIAGNOSIS — F418 Other specified anxiety disorders: Secondary | ICD-10-CM

## 2024-10-17 DIAGNOSIS — N946 Dysmenorrhea, unspecified: Secondary | ICD-10-CM | POA: Diagnosis not present

## 2024-10-17 DIAGNOSIS — Z9049 Acquired absence of other specified parts of digestive tract: Secondary | ICD-10-CM

## 2024-10-17 DIAGNOSIS — F419 Anxiety disorder, unspecified: Secondary | ICD-10-CM

## 2024-10-17 DIAGNOSIS — E119 Type 2 diabetes mellitus without complications: Secondary | ICD-10-CM

## 2024-10-17 DIAGNOSIS — R7303 Prediabetes: Secondary | ICD-10-CM

## 2024-10-17 MED ORDER — ESCITALOPRAM OXALATE 5 MG PO TABS: 5.0000 mg | ORAL_TABLET | Freq: Every day | ORAL | 0 refills | Status: AC

## 2024-10-17 MED ORDER — MELOXICAM 7.5 MG PO TABS: 7.5000 mg | ORAL_TABLET | Freq: Every day | ORAL | 0 refills | Status: DC

## 2024-10-17 MED ORDER — HYDROXYZINE PAMOATE 50 MG PO CAPS: 50.0000 mg | ORAL_CAPSULE | Freq: Four times a day (QID) | ORAL | 2 refills | Status: AC | PRN

## 2024-10-17 NOTE — Progress Notes (Signed)
 Patient needs to see someone about her stomach about her fat and she is not having regular bowel movement , medication refill

## 2024-10-17 NOTE — Progress Notes (Signed)
 Patient ID: Ashley Casey, female    DOB: [birth year]  MRN: [id number]  CC: Chronic Conditions Follow-Up  Subjective: Ashley Casey is a 43 y.o. female who presents for chronic conditions follow-up.   Her concerns today include:  - Prediabetes follow-up.  - Due for diabetic eye exam.  - Anxiety depression persisting. Doing well on Hydroxyzine , no issues/concerns. States would like to add a new medication to help with anxiety depression and referral to a therapist. She denies thoughts of self-harm, suicidal ideations, homicidal ideations. - Doing well on Meloxicam , no issues/concerns. - Constipation persisting. Denies red flag symptoms. Taking over-the-counter medications with minimal relief. States history of gallbladder removal. Requests referral to specialist.   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 8 g 0   EPINEPHrine  0.3 mg/0.3 mL IJ SOAJ injection Inject 0.3 mg into the muscle as needed for anaphylaxis. 2 each 2   Iron  Combinations (CHROMAGEN) capsule Take 1 capsule by mouth daily.     JANUVIA  25 MG tablet Take 25 mg by mouth daily.     methocarbamol  (ROBAXIN ) 500 MG tablet Take 1 tablet (500 mg total) by mouth every 8 (eight) hours as needed for muscle spasms. 90 tablet 0   diazepam  (VALIUM ) 5 MG tablet Take 1 tablet (5 mg total) by mouth every 12 (twelve) hours as  needed for fear of flying. (Patient not taking: Reported on 09/28/2024) 2 tablet 0   [DISCONTINUED] clonazePAM  (KLONOPIN ) 0.5 MG tablet Take 1 tablet (0.5 mg total) by mouth 2 (two) times daily as needed for anxiety. 6 tablet 0   No current facility-administered medications on file prior to visit.    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     Tegretol [Carbamazepine] Other (See Comments)    Reaction:  Seizures     Social History   Socioeconomic History   Marital status: Married    Spouse name: Not on file   Number of children: 7   Years of education: Not on file   Highest education level: Associate degree: occupational, scientist, product/process development, or vocational program  Occupational History   Occupation: shuttle driver  Tobacco Use   Smoking status: Never    Passive exposure: Never   Smokeless tobacco: Never  Vaping Use   Vaping status: Never Used  Substance and Sexual Activity   Alcohol use: No   Drug use: No   Sexual activity: Yes    Partners: Male    Birth control/protection: None  Other Topics Concern   Not on file  Social History Narrative   Not on file   Social Drivers of Health   Financial Resource Strain: Low Risk  (  06/27/2024)   Overall Financial Resource Strain (CARDIA)    Difficulty of Paying Living Expenses: Not hard at all  Food Insecurity: No Food Insecurity (06/27/2024)   Hunger Vital Sign    Worried About Running Out of Food in the Last Year: Never true    Ran Out of Food in the Last Year: Never true  Transportation Needs: No Transportation Needs (06/27/2024)   PRAPARE - Administrator, Civil Service (Medical): No    Lack of Transportation (Non-Medical): No  Physical Activity: Sufficiently Active (06/27/2024)   Exercise Vital Sign    Days of Exercise per  Week: 4 days    Minutes of Exercise per Session: 60 min  Stress: Stress Concern Present (06/28/2024)   [employer] of Occupational Health - Occupational Stress Questionnaire    Feeling of Stress: To some extent  Social Connections: Moderately Isolated (06/27/2024)   Social Connection and Isolation Panel    Frequency of Communication with Friends and Family: Three times a week    Frequency of Social Gatherings with Friends and Family: Three times a week    Attends Religious Services: Never    Active Member of Clubs or Organizations: No    Attends Banker Meetings: Not on file    Marital Status: Married  Catering Manager Violence: Not At Risk (06/28/2024)   Humiliation, Afraid, Rape, and Kick questionnaire    Fear of Current or Ex-Partner: No    Emotionally Abused: No    Physically Abused: No    Sexually Abused: No    Family History  Problem Relation Age of Onset   Other Mother        sickle cell anemia   Diabetes Mother    Sickle cell anemia Mother    Anemia Sister    Arthritis Brother    Asthma Brother    Diabetes Maternal Grandmother    Diabetes Paternal Grandmother    Colon cancer Neg Hx    Stomach cancer Neg Hx    Esophageal cancer Neg Hx     Past Surgical History:  Procedure Laterality Date   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     CHOLECYSTECTOMY  8/29/121   THERAPEUTIC ABORTION     WISDOM TOOTH EXTRACTION      ROS: Review of Systems Negative except as stated above  PHYSICAL EXAM: BP 109/76   Pulse 81   Temp 98 F (36.7 C) (Oral)   Resp 16   Ht 5' 5 (1.651 m)   Wt 189 lb 6.4 oz (85.9 kg)   LMP 10/07/2024 (Exact Date)   SpO2 98%   BMI 31.52 kg/m   Physical Exam HENT:     Head: Normocephalic and atraumatic.     Nose: Nose normal.     Mouth/Throat:     Mouth: Mucous membranes are moist.     Pharynx: Oropharynx is clear.  Eyes:     Extraocular Movements: Extraocular movements intact.     Conjunctiva/sclera: Conjunctivae normal.      Pupils: Pupils are equal, round, and reactive to light.  Cardiovascular:     Rate and Rhythm: Normal rate and regular rhythm.     Pulses: Normal pulses.     Heart sounds: Normal heart sounds.  Pulmonary:     Effort: Pulmonary effort is normal.     Breath sounds: Normal breath sounds.  Abdominal:     General: Bowel sounds are normal.     Palpations: Abdomen is soft.  Musculoskeletal:  General: Normal range of motion.     Cervical back: Normal range of motion and neck supple.  Neurological:     General: No focal deficit present.     Mental Status: She is alert and oriented to person, place, and time.  Psychiatric:        Mood and Affect: Mood normal.        Behavior: Behavior normal.     ASSESSMENT AND PLAN: 1. Prediabetes (Primary) - Routine screening.  - Hemoglobin A1c - Basic Metabolic Panel  2. Diabetic eye exam [facility]) - Referral to Ophthalmology for evaluation/management. - Ambulatory referral to Ophthalmology  3. Anxiety and depression - Patient denies thoughts of self-harm, suicidal ideations, homicidal ideations. - Trial Escitalopram as prescribed. Counseled on medication adherence/adverse effects.  - Continue Hydroxyzine  as prescribed. Counseled on medication adherence/adverse effects. - Referral to Psychiatry for evaluation/management. - Follow-up with primary provider as scheduled.  - Ambulatory referral to Psychiatry - escitalopram (LEXAPRO) 5 MG tablet; Take 1 tablet (5 mg total) by mouth daily.  Dispense: 90 tablet; Refill: 0 - hydrOXYzine  (VISTARIL ) 50 MG capsule; Take 1 capsule (50 mg total) by mouth every 6 (six) hours as needed.  Dispense: 90 capsule; Refill: 2  4. Dysmenorrhea - Continue Meloxicam  as prescribed. Counseled on medication adherence/adverse effects.  - Follow-up with primary provider in 3 months or sooner if needed. - meloxicam  (MOBIC ) 7.5 MG tablet; Take 1 tablet (7.5 mg total) by mouth daily.  Dispense: 90 tablet; Refill: 0  5.  Constipation, unspecified constipation type 6. History of cholecystectomy - Patient declined pharmacological therapy.  - Referral to Gastroenterology for evaluation/management. - Follow-up with primary provider as scheduled.  - Ambulatory referral to Gastroenterology   Patient was given the opportunity to ask questions.  Patient verbalized understanding of the plan and was able to repeat key elements of the plan. Patient was given clear instructions to go to Emergency Department or return to medical center if symptoms don't improve, worsen, or new problems develop.The patient verbalized understanding.   Orders Placed This Encounter  Procedures   Hemoglobin A1c   Basic Metabolic Panel   Ambulatory referral to Ophthalmology   Ambulatory referral to Gastroenterology   Ambulatory referral to Psychiatry     Requested Prescriptions   Signed Prescriptions Disp Refills   escitalopram (LEXAPRO) 5 MG tablet 90 tablet 0    Sig: Take 1 tablet (5 mg total) by mouth daily.   hydrOXYzine  (VISTARIL ) 50 MG capsule 90 capsule 2    Sig: Take 1 capsule (50 mg total) by mouth every 6 (six) hours as needed.   meloxicam  (MOBIC ) 7.5 MG tablet 90 tablet 0    Sig: Take 1 tablet (7.5 mg total) by mouth daily.    Return in 4 weeks (on 11/14/2024) for Follow-Up or next available chronic conditions.  Greig JINNY Drones, NP

## 2024-10-18 ENCOUNTER — Ambulatory Visit: Payer: Self-pay | Admitting: Family

## 2024-10-18 DIAGNOSIS — E1165 Type 2 diabetes mellitus with hyperglycemia: Secondary | ICD-10-CM

## 2024-10-18 LAB — BASIC METABOLIC PANEL WITH GFR
BUN/Creatinine Ratio: 15 (ref 9–23)
BUN: 10 mg/dL (ref 6–24)
CO2: 23 mmol/L (ref 20–29)
Calcium: 9.4 mg/dL (ref 8.7–10.2)
Chloride: 103 mmol/L (ref 96–106)
Creatinine, Ser: 0.66 mg/dL (ref 0.57–1.00)
Glucose: 102 mg/dL — ABNORMAL HIGH (ref 70–99)
Potassium: 4.7 mmol/L (ref 3.5–5.2)
Sodium: 138 mmol/L (ref 134–144)
eGFR: 111 mL/min/1.73 (ref >59)

## 2024-10-18 LAB — HEMOGLOBIN A1C
Est. average glucose Bld gHb Est-mCnc: 146 mg/dL
Hgb A1c MFr Bld: 6.7 % — ABNORMAL HIGH (ref 4.8–5.6)

## 2024-10-18 MED ORDER — SITAGLIPTIN PHOSPHATE 25 MG PO TABS: 25.0000 mg | ORAL_TABLET | Freq: Every day | ORAL | 0 refills | Status: DC

## 2025-01-25 ENCOUNTER — Ambulatory Visit: Admitting: Physician Assistant

## 2025-01-25 ENCOUNTER — Other Ambulatory Visit: Payer: Self-pay | Admitting: Family

## 2025-01-25 ENCOUNTER — Encounter: Payer: Self-pay | Admitting: Physician Assistant

## 2025-01-25 ENCOUNTER — Telehealth: Payer: Self-pay

## 2025-01-25 VITALS — BP 122/70 | HR 84 | Ht 65.0 in | Wt 188.0 lb

## 2025-01-25 DIAGNOSIS — N946 Dysmenorrhea, unspecified: Secondary | ICD-10-CM

## 2025-01-25 DIAGNOSIS — R197 Diarrhea, unspecified: Secondary | ICD-10-CM | POA: Diagnosis not present

## 2025-01-25 DIAGNOSIS — K219 Gastro-esophageal reflux disease without esophagitis: Secondary | ICD-10-CM

## 2025-01-25 DIAGNOSIS — D649 Anemia, unspecified: Secondary | ICD-10-CM

## 2025-01-25 DIAGNOSIS — D509 Iron deficiency anemia, unspecified: Secondary | ICD-10-CM

## 2025-01-25 DIAGNOSIS — K5904 Chronic idiopathic constipation: Secondary | ICD-10-CM | POA: Diagnosis not present

## 2025-01-25 DIAGNOSIS — E119 Type 2 diabetes mellitus without complications: Secondary | ICD-10-CM

## 2025-01-25 DIAGNOSIS — Z7984 Long term (current) use of oral hypoglycemic drugs: Secondary | ICD-10-CM | POA: Diagnosis not present

## 2025-01-25 DIAGNOSIS — Z1211 Encounter for screening for malignant neoplasm of colon: Secondary | ICD-10-CM

## 2025-01-25 DIAGNOSIS — Z9049 Acquired absence of other specified parts of digestive tract: Secondary | ICD-10-CM

## 2025-01-25 DIAGNOSIS — E1165 Type 2 diabetes mellitus with hyperglycemia: Secondary | ICD-10-CM

## 2025-01-25 MED ORDER — NA SULFATE-K SULFATE-MG SULF 17.5-3.13-1.6 GM/177ML PO SOLN: 1.0000 | Freq: Once | ORAL | 0 refills | Status: AC

## 2025-01-25 NOTE — Addendum Note (Signed)
 Addended by: VICCI FUJITA on: 01/25/2025 01:20 PM   Modules accepted: Orders

## 2025-01-25 NOTE — Telephone Encounter (Signed)
 See telephone note

## 2025-01-25 NOTE — Patient Instructions (Addendum)
 Toileting tips to help with your constipation - Drink at least 64-80 ounces of water/liquid per day. - Establish a time to try to move your bowels every day.  For many people, this is after a cup of coffee or after a meal such as breakfast. - Sit all of the way back on the toilet keeping your back fairly straight and while sitting up, try to rest the tops of your forearms on your upper thighs.   - Raising your feet with a step stool/squatty potty can be helpful to improve the angle that allows your stool to pass through the rectum. - Relax the rectum feeling it bulge toward the toilet water.  If you feel your rectum raising toward your body, you are contracting rather than relaxing. - Breathe in and slowly exhale. Belly breath by expanding your belly towards your belly button. Keep belly expanded as you gently direct pressure down and back to the anus.  A low pitched GRRR sound can assist with increasing intra-abdominal pressure.  (Can also trying to blow on a pinwheel and make it move, this helps with the same belly breathing) - Repeat 3-4 times. If unsuccessful, contract the pelvic floor to restore normal tone and get off the toilet.  Avoid excessive straining. - To reduce excessive wiping by teaching your anus to normally contract, place hands on outer aspect of knees and resist knee movement outward.  Hold 5-10 second then place hands just inside of knees and resist inward movement of knees.  Hold 5 seconds.  Repeat a few times each way.  Go to the ER if unable to pass gas, severe AB pain, unable to hold down food, any shortness of breath of chest pain.  Miralax is an osmotic laxative.  It only brings more water into the stool.  This is safe to take daily.  Can take up to 17 gram of miralax twice a day.  Mix with juice or coffee.  Start 1 capful for 3-4 days and reassess your response in 3-4 days.  You can increase and decrease the dose based on your response.  Remember, it can take up to  3-4 days to take effect OR for the effects to wear off.   I often pair this with benefiber in the morning to help assure the stool is not too loose.   If this does not help  Here some information about pelvic floor dysfunction. This may be contributing to some of your symptoms. We will continue with our evaluation but I do want you to consider adding on fiber supplement with low-dose MiraLAX daily. We could also refer to pelvic floor physical therapy.   Pelvic Floor Dysfunction, Female Pelvic floor dysfunction (PFD) is a condition that results when the group of muscles and connective tissues that support the organs in the pelvis (pelvic floor muscles) do not work well. These muscles and their connections form a sling that supports the colon and bladder. In women, they also support the uterus. PFD causes pelvic floor muscles to be too weak, too tight, or both. In PFD, muscle movements are not coordinated. This may cause bowel or bladder problems. It may also cause pain. What are the causes? This condition may be caused by an injury to the pelvic area or by a weakening of pelvic muscles. This often results from pregnancy and childbirth or other types of strain. In many cases, the exact cause is not known. What increases the risk? The following factors may make you more likely  to develop this condition: Having chronic bladder tissue inflammation (interstitial cystitis). Being an older person. Being overweight. History of radiation treatment for cancer in the pelvic region. Previous pelvic surgery, such as removal of the uterus (hysterectomy). What are the signs or symptoms? Symptoms of this condition vary and may include: Bladder symptoms, such as: Trouble starting urination and emptying the bladder. Frequent urinary tract infections. Leaking urine when coughing, laughing, or exercising (stress incontinence). Having to pass urine urgently or frequently. Pain when passing urine. Bowel  symptoms, such as: Constipation. Urgent or frequent bowel movements. Incomplete bowel movements. Painful bowel movements. Leaking stool or gas. Unexplained genital or rectal pain. Genital or rectal muscle spasms. Low back pain. Other symptoms may include: A heavy, full, or aching feeling in the vagina. A bulge that protrudes into the vagina. Pain during or after sex. How is this diagnosed? This condition may be diagnosed based on: Your symptoms and medical history. A physical exam. During the exam, your health care provider may check your pelvic muscles for tightness, spasm, pain, or weakness. This may include a rectal exam and a pelvic exam. In some cases, you may have diagnostic tests, such as: Electrical muscle function tests. Urine flow testing. X-ray tests of bowel function. Ultrasound of the pelvic organs. How is this treated? Treatment for this condition depends on the symptoms. Treatment options include: Physical therapy. This may include Kegel exercises to help relax or strengthen the pelvic floor muscles. Biofeedback. This type of therapy provides feedback on how tight your pelvic floor muscles are so that you can learn to control them. Internal or external massage therapy. A treatment that involves electrical stimulation of the pelvic floor muscles to help control pain (transcutaneous electrical nerve stimulation, or TENS). Sound wave therapy (ultrasound) to reduce muscle spasms. Medicines, such as: Muscle relaxants. Bladder control medicines. Surgery to reconstruct or support pelvic floor muscles may be an option if other treatments do not help. Follow these instructions at home: Activity Do your usual activities as told by your health care provider. Ask your health care provider if you should modify any activities. Do pelvic floor strengthening or relaxing exercises at home as told by your physical therapist. Lifestyle Maintain a healthy weight. Eat foods that are  high in fiber, such as beans, whole grains, and fresh fruits and vegetables. Limit foods that are high in fat and processed sugars, such as fried or sweet foods. Manage stress with relaxation techniques such as yoga or meditation. General instructions If you have problems with leakage: Use absorbable pads or wear padded underwear. Wash frequently with mild soap. Keep your genital and anal area as clean and dry as possible. Ask your health care provider if you should try a barrier cream to prevent skin irritation. Take warm baths to relieve pelvic muscle tension or spasms. Take over-the-counter and prescription medicines only as told by your health care provider. Keep all follow-up visits. How is this prevented? The cause of PFD is not always known, but there are a few things you can do to reduce the risk of developing this condition, including: Staying at a healthy weight. Getting regular exercise. Managing stress. Contact a health care provider if: Your symptoms are not improving with home care. You have signs or symptoms of PFD that get worse at home. You develop new signs or symptoms. You have signs of a urinary tract infection, such as: Fever. Chills. Increased urinary frequency. A burning feeling when urinating. You have not had a bowel movement in  3 days (constipation). Summary Pelvic floor dysfunction results when the muscles and connective tissues in your pelvic floor do not work well. These muscles and their connections form a sling that supports your colon and bladder. In women, they also support the uterus. PFD may be caused by an injury to the pelvic area or by a weakening of pelvic muscles. PFD causes pelvic floor muscles to be too weak, too tight, or a combination of both. Symptoms may vary from person to person. In most cases, PFD can be treated with physical therapies and medicines. Surgery may be an option if other treatments do not help. This information is not  intended to replace advice given to you by your health care provider. Make sure you discuss any questions you have with your health care provider. Document Revised: 04/17/2021 Document Reviewed: 04/17/2021 Elsevier Patient Education  2022 [employer]  Understanding Your Weekly GLP-1 Injection  A helpful guide to managing common side effects  You are on a once-weekly injectable medication in the GLP-1 receptor agonist class. These medications can be very effective for blood sugar control, weight loss, and heart protection, fatty liver or OSA, but they can also come with some side effects that are important to understand. The good news is: most side effects can be managed with a few adjustments.  1. Gastroparesis-Like Symptoms These medications slow down your stomach to help you feel full longer -- great for weight loss and blood sugar control, but they can sometimes cause symptoms that feel like gastroparesis (slow stomach emptying). Symptoms may include: -Feeling full quickly when eating -Nausea or vomiting -Bloating or abdominal discomfort -Worsening heartburn or reflux -Acid regurgitation -Stomach spasms or tightness What you can do: ??? Eat small, frequent meals (4-6 per day) ?? Drink fluids between meals, not during ?? Avoid high-fiber foods (like raw veggies or whole grains); cook your veggies well ?? Spread protein throughout the day (try Greek yogurt, eggs, soft meats, Glucerna, milk) ?? Choose soft foods you can mash with a fork ?? Switch to pured foods or liquids during flare-ups ?? Consider reading: Living Well with Gastroparesis by Camelia Bone ?? Downloadable Diet Guide: [facility] Gastroparesis Diet PDF  ?? Tip: Try following a gastroparesis-friendly diet on the day of your injection and the day after, when the medication's effect is strongest.  2. Constipation Since this medication slows down your digestive system, constipation is very common. Tips to  help: ?? Drink plenty of water ???? Stay active with regular exercise ?? Add fiber-rich but gentle foods like kiwi ?? Try a low-dose magnesium  supplement at night ?? Use MiraLAX (half to one capful daily) if constipation becomes more frequent, especially if your dose increases  If these strategies dont help, talk to your provider -- they may recommend or prescribe other treatments.  3. When to Call the Doctor or Go to the ER While rare, this medication can slightly increase your risk of serious conditions like: Pancreatitis (inflammation of the pancreas) Gallstones or gallbladder problems Watch for these signs and seek help if you experience: Severe abdominal pain (especially in the upper belly or that radiates to your back) Pain in the right upper side of your abdomen Nausea, vomiting, fever, or chills that dont go away ?? Call your provider or go to the ER if these occur.  4. Who Should NOT Take This Medication? This medication should be avoided if you have: A personal history of pancreatitis  A personal or family history of medullary thyroid cancer A  condition called Multiple Endocrine Neoplasia Syndrome Type 2 (MEN2)  Final Note If the side effects are too bothersome, remember: Most symptoms will go away if you stop the medication. But many people tolerate it well after the first few weeks, especially with the right strategies in place.  We have sent the following medications to your pharmacy for you to pick up at your convenience: SUPREP  You have been scheduled for a colonoscopy. Please follow written instructions given to you at your visit today.   If you use inhalers (even only as needed), please bring them with you on the day of your procedure.  DO NOT TAKE 7 DAYS PRIOR TO TEST- Trulicity (dulaglutide) Ozempic, Wegovy (semaglutide) Mounjaro, Zepbound (tirzepatide) Bydureon Bcise (exanatide extended release)  DO NOT TAKE 1 DAY PRIOR TO YOUR TEST Rybelsus  (semaglutide) Adlyxin (lixisenatide) Victoza (liraglutide) Byetta (exanatide) ___________________________________________________________________________  Due to recent changes in healthcare laws, you may see the results of your imaging and laboratory studies on MyChart before your provider has had a chance to review them.  We understand that in some cases there may be results that are confusing or concerning to you. Not all laboratory results come back in the same time frame and the provider may be waiting for multiple results in order to interpret others.  Please give us  48 hours in order for your provider to thoroughly review all the results before contacting the office for clarification of your results.   _______________________________________________________  If your blood pressure at your visit was 140/90 or greater, please contact your primary care physician to follow up on this.  _______________________________________________________  If you are age 65 or older, your body mass index should be between 23-30. Your Body mass index is 31.28 kg/m. If this is out of the aforementioned range listed, please consider follow up with your Primary Care Provider.  If you are age 63 or younger, your body mass index should be between 19-25. Your Body mass index is 31.28 kg/m. If this is out of the aformentioned range listed, please consider follow up with your Primary Care Provider.   ________________________________________________________  The [facility] providers would like to encourage you to use MYCHART to communicate with providers for non-urgent requests or questions.  Due to long hold times on the telephone, sending your provider a message by [facility] may be a faster and more efficient way to get a response.  Please allow 48 business hours for a response.  Please remember that this is for non-urgent requests.  _______________________________________________________  [facility] is using a team-based approach to care.  Your team is made up of your doctor and two to three APPS. Our APPS (Nurse Practitioners and Physician Assistants) work with your physician to ensure care continuity for you. They are fully qualified to address your health concerns and develop a treatment plan. They communicate directly with your gastroenterologist to care for you. Seeing the Advanced Practice Practitioners on your physician's team can help you by facilitating care more promptly, often allowing for earlier appointments, access to diagnostic testing, procedures, and other specialty referrals.   Thank you for trusting me with your gastrointestinal care. Alan Coombs, PA-C

## 2025-01-26 ENCOUNTER — Telehealth: Payer: Self-pay

## 2025-01-26 NOTE — Telephone Encounter (Signed)
 Pt needing updated prep instructions for 02/11. Please advise. Thank you.

## 2025-01-26 NOTE — Telephone Encounter (Signed)
 See telephone encounter.

## 2025-01-26 NOTE — Telephone Encounter (Signed)
 Complete

## 2025-01-31 ENCOUNTER — Ambulatory Visit: Admitting: Gastroenterology

## 2025-02-01 ENCOUNTER — Encounter: Admitting: Gastroenterology
